# Patient Record
Sex: Female | Born: 1980
Health system: Southern US, Community
[De-identification: ages and names within clinical notes are randomized; demographics above are authoritative.]

## PROBLEM LIST (undated history)

## (undated) DIAGNOSIS — M419 Scoliosis, unspecified: Secondary | ICD-10-CM

## (undated) DIAGNOSIS — K429 Umbilical hernia without obstruction or gangrene: Secondary | ICD-10-CM

## (undated) DIAGNOSIS — F329 Major depressive disorder, single episode, unspecified: Secondary | ICD-10-CM

## (undated) DIAGNOSIS — D573 Sickle-cell trait: Secondary | ICD-10-CM

## (undated) DIAGNOSIS — F429 Obsessive-compulsive disorder, unspecified: Secondary | ICD-10-CM

## (undated) DIAGNOSIS — M797 Fibromyalgia: Secondary | ICD-10-CM

## (undated) DIAGNOSIS — R51 Headache: Secondary | ICD-10-CM

## (undated) DIAGNOSIS — K219 Gastro-esophageal reflux disease without esophagitis: Secondary | ICD-10-CM

## (undated) DIAGNOSIS — M1990 Unspecified osteoarthritis, unspecified site: Secondary | ICD-10-CM

## (undated) DIAGNOSIS — D649 Anemia, unspecified: Secondary | ICD-10-CM

## (undated) DIAGNOSIS — G5603 Carpal tunnel syndrome, bilateral upper limbs: Secondary | ICD-10-CM

## (undated) DIAGNOSIS — F410 Panic disorder [episodic paroxysmal anxiety] without agoraphobia: Secondary | ICD-10-CM

## (undated) DIAGNOSIS — F419 Anxiety disorder, unspecified: Secondary | ICD-10-CM

## (undated) DIAGNOSIS — F32A Depression, unspecified: Secondary | ICD-10-CM

## (undated) DIAGNOSIS — Z8709 Personal history of other diseases of the respiratory system: Secondary | ICD-10-CM

## (undated) DIAGNOSIS — D259 Leiomyoma of uterus, unspecified: Secondary | ICD-10-CM

## (undated) DIAGNOSIS — F319 Bipolar disorder, unspecified: Secondary | ICD-10-CM

## (undated) DIAGNOSIS — R7303 Prediabetes: Secondary | ICD-10-CM

## (undated) HISTORY — PX: WISDOM TOOTH EXTRACTION: SHX21

## (undated) HISTORY — PX: BACK SURGERY: SHX140

---
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
## 2000-04-07 ENCOUNTER — Encounter: Payer: Self-pay | Admitting: Emergency Medicine

## 2000-04-07 ENCOUNTER — Emergency Department (HOSPITAL_COMMUNITY): Admission: EM | Admit: 2000-04-07 | Discharge: 2000-04-07 | Payer: Self-pay | Admitting: Emergency Medicine

## 2001-04-28 ENCOUNTER — Emergency Department (HOSPITAL_COMMUNITY): Admission: EM | Admit: 2001-04-28 | Discharge: 2001-04-28 | Payer: Self-pay

## 2001-11-25 ENCOUNTER — Emergency Department (HOSPITAL_COMMUNITY): Admission: EM | Admit: 2001-11-25 | Discharge: 2001-11-25 | Payer: Self-pay | Admitting: Emergency Medicine

## 2002-04-09 ENCOUNTER — Emergency Department (HOSPITAL_COMMUNITY): Admission: EM | Admit: 2002-04-09 | Discharge: 2002-04-09 | Payer: Self-pay | Admitting: *Deleted

## 2002-06-11 ENCOUNTER — Emergency Department (HOSPITAL_COMMUNITY): Admission: EM | Admit: 2002-06-11 | Discharge: 2002-06-11 | Payer: Self-pay | Admitting: *Deleted

## 2003-03-09 ENCOUNTER — Emergency Department (HOSPITAL_COMMUNITY): Admission: EM | Admit: 2003-03-09 | Discharge: 2003-03-09 | Payer: Self-pay | Admitting: Emergency Medicine

## 2003-05-16 ENCOUNTER — Emergency Department (HOSPITAL_COMMUNITY): Admission: EM | Admit: 2003-05-16 | Discharge: 2003-05-16 | Payer: Self-pay | Admitting: Emergency Medicine

## 2003-09-10 ENCOUNTER — Emergency Department (HOSPITAL_COMMUNITY): Admission: AD | Admit: 2003-09-10 | Discharge: 2003-09-10 | Payer: Self-pay | Admitting: Family Medicine

## 2004-06-30 ENCOUNTER — Emergency Department (HOSPITAL_COMMUNITY): Admission: EM | Admit: 2004-06-30 | Discharge: 2004-06-30 | Payer: Self-pay | Admitting: Emergency Medicine

## 2004-08-04 ENCOUNTER — Emergency Department (HOSPITAL_COMMUNITY): Admission: EM | Admit: 2004-08-04 | Discharge: 2004-08-04 | Payer: Self-pay | Admitting: Emergency Medicine

## 2004-08-24 ENCOUNTER — Emergency Department (HOSPITAL_COMMUNITY): Admission: EM | Admit: 2004-08-24 | Discharge: 2004-08-24 | Payer: Self-pay | Admitting: Family Medicine

## 2004-12-14 ENCOUNTER — Emergency Department (HOSPITAL_COMMUNITY): Admission: EM | Admit: 2004-12-14 | Discharge: 2004-12-14 | Payer: Self-pay | Admitting: Emergency Medicine

## 2005-01-19 ENCOUNTER — Ambulatory Visit: Payer: Self-pay | Admitting: Family Medicine

## 2005-01-24 ENCOUNTER — Ambulatory Visit: Payer: Self-pay | Admitting: *Deleted

## 2005-01-27 ENCOUNTER — Ambulatory Visit: Payer: Self-pay | Admitting: Internal Medicine

## 2005-02-03 ENCOUNTER — Ambulatory Visit: Payer: Self-pay | Admitting: Internal Medicine

## 2005-03-07 ENCOUNTER — Ambulatory Visit: Payer: Self-pay | Admitting: Family Medicine

## 2005-03-14 ENCOUNTER — Ambulatory Visit: Payer: Self-pay | Admitting: Family Medicine

## 2005-03-30 ENCOUNTER — Ambulatory Visit: Payer: Self-pay | Admitting: Family Medicine

## 2005-08-17 ENCOUNTER — Emergency Department (HOSPITAL_COMMUNITY): Admission: EM | Admit: 2005-08-17 | Discharge: 2005-08-17 | Payer: Self-pay | Admitting: Family Medicine

## 2005-09-22 ENCOUNTER — Emergency Department (HOSPITAL_COMMUNITY): Admission: EM | Admit: 2005-09-22 | Discharge: 2005-09-22 | Payer: Self-pay | Admitting: Family Medicine

## 2005-10-28 ENCOUNTER — Emergency Department (HOSPITAL_COMMUNITY): Admission: EM | Admit: 2005-10-28 | Discharge: 2005-10-28 | Payer: Self-pay | Admitting: Family Medicine

## 2006-05-27 ENCOUNTER — Emergency Department (HOSPITAL_COMMUNITY): Admission: EM | Admit: 2006-05-27 | Discharge: 2006-05-27 | Payer: Self-pay | Admitting: Emergency Medicine

## 2006-05-31 ENCOUNTER — Emergency Department (HOSPITAL_COMMUNITY): Admission: EM | Admit: 2006-05-31 | Discharge: 2006-05-31 | Payer: Self-pay | Admitting: Family Medicine

## 2006-07-30 ENCOUNTER — Ambulatory Visit: Payer: Self-pay | Admitting: Internal Medicine

## 2006-08-02 ENCOUNTER — Ambulatory Visit (HOSPITAL_COMMUNITY): Admission: RE | Admit: 2006-08-02 | Discharge: 2006-08-02 | Payer: Self-pay | Admitting: Internal Medicine

## 2006-08-03 ENCOUNTER — Ambulatory Visit: Payer: Self-pay | Admitting: Internal Medicine

## 2006-09-19 ENCOUNTER — Ambulatory Visit: Payer: Self-pay | Admitting: Obstetrics & Gynecology

## 2006-09-19 ENCOUNTER — Encounter (INDEPENDENT_AMBULATORY_CARE_PROVIDER_SITE_OTHER): Payer: Self-pay | Admitting: *Deleted

## 2006-10-10 ENCOUNTER — Ambulatory Visit (HOSPITAL_COMMUNITY): Admission: RE | Admit: 2006-10-10 | Discharge: 2006-10-10 | Payer: Self-pay | Admitting: Gynecology

## 2006-12-11 ENCOUNTER — Ambulatory Visit: Payer: Self-pay | Admitting: Internal Medicine

## 2006-12-13 ENCOUNTER — Ambulatory Visit (HOSPITAL_COMMUNITY): Admission: RE | Admit: 2006-12-13 | Discharge: 2006-12-13 | Payer: Self-pay | Admitting: Internal Medicine

## 2007-03-15 ENCOUNTER — Telehealth (INDEPENDENT_AMBULATORY_CARE_PROVIDER_SITE_OTHER): Payer: Self-pay | Admitting: Internal Medicine

## 2007-03-15 ENCOUNTER — Emergency Department (HOSPITAL_COMMUNITY): Admission: EM | Admit: 2007-03-15 | Discharge: 2007-03-15 | Payer: Self-pay | Admitting: Family Medicine

## 2007-06-24 ENCOUNTER — Telehealth (INDEPENDENT_AMBULATORY_CARE_PROVIDER_SITE_OTHER): Payer: Self-pay | Admitting: Internal Medicine

## 2007-10-26 ENCOUNTER — Emergency Department (HOSPITAL_COMMUNITY): Admission: EM | Admit: 2007-10-26 | Discharge: 2007-10-26 | Payer: Self-pay | Admitting: Emergency Medicine

## 2007-12-28 ENCOUNTER — Emergency Department (HOSPITAL_COMMUNITY): Admission: EM | Admit: 2007-12-28 | Discharge: 2007-12-28 | Payer: Self-pay | Admitting: Family Medicine

## 2008-09-02 ENCOUNTER — Emergency Department (HOSPITAL_COMMUNITY): Admission: EM | Admit: 2008-09-02 | Discharge: 2008-09-02 | Payer: Self-pay | Admitting: Emergency Medicine

## 2008-09-08 ENCOUNTER — Encounter (INDEPENDENT_AMBULATORY_CARE_PROVIDER_SITE_OTHER): Payer: Self-pay | Admitting: Internal Medicine

## 2008-09-08 ENCOUNTER — Emergency Department (HOSPITAL_COMMUNITY): Admission: EM | Admit: 2008-09-08 | Discharge: 2008-09-08 | Payer: Self-pay | Admitting: Emergency Medicine

## 2008-10-30 ENCOUNTER — Encounter (INDEPENDENT_AMBULATORY_CARE_PROVIDER_SITE_OTHER): Payer: Self-pay | Admitting: Nurse Practitioner

## 2008-10-30 ENCOUNTER — Ambulatory Visit: Payer: Self-pay | Admitting: Internal Medicine

## 2008-10-30 DIAGNOSIS — M2550 Pain in unspecified joint: Secondary | ICD-10-CM | POA: Insufficient documentation

## 2008-10-30 LAB — CONVERTED CEMR LAB
ALT: 12 units/L (ref 0–35)
Albumin: 4.4 g/dL (ref 3.5–5.2)
Anti Nuclear Antibody(ANA): NEGATIVE
BUN: 14 mg/dL (ref 6–23)
Calcium: 9.6 mg/dL (ref 8.4–10.5)
Creatinine, Ser: 0.86 mg/dL (ref 0.40–1.20)
Sed Rate: 10 mm/hr (ref 0–22)
Sodium: 138 meq/L (ref 135–145)
Total Protein: 7.6 g/dL (ref 6.0–8.3)

## 2008-11-09 ENCOUNTER — Encounter (INDEPENDENT_AMBULATORY_CARE_PROVIDER_SITE_OTHER): Payer: Self-pay | Admitting: Internal Medicine

## 2008-11-09 DIAGNOSIS — G4700 Insomnia, unspecified: Secondary | ICD-10-CM | POA: Insufficient documentation

## 2008-11-09 DIAGNOSIS — G894 Chronic pain syndrome: Secondary | ICD-10-CM

## 2008-11-24 ENCOUNTER — Encounter (INDEPENDENT_AMBULATORY_CARE_PROVIDER_SITE_OTHER): Payer: Self-pay | Admitting: Internal Medicine

## 2008-11-24 DIAGNOSIS — M4120 Other idiopathic scoliosis, site unspecified: Secondary | ICD-10-CM | POA: Insufficient documentation

## 2008-11-25 ENCOUNTER — Telehealth (INDEPENDENT_AMBULATORY_CARE_PROVIDER_SITE_OTHER): Payer: Self-pay | Admitting: Nurse Practitioner

## 2008-12-03 ENCOUNTER — Telehealth (INDEPENDENT_AMBULATORY_CARE_PROVIDER_SITE_OTHER): Payer: Self-pay | Admitting: Internal Medicine

## 2008-12-11 ENCOUNTER — Ambulatory Visit: Payer: Self-pay | Admitting: Internal Medicine

## 2008-12-11 DIAGNOSIS — R7309 Other abnormal glucose: Secondary | ICD-10-CM

## 2008-12-11 LAB — CONVERTED CEMR LAB
Blood Glucose, Fingerstick: 127
Blood in Urine, dipstick: NEGATIVE
KOH Prep: NEGATIVE
Urobilinogen, UA: 0.2
Whiff Test: POSITIVE

## 2008-12-24 ENCOUNTER — Encounter (INDEPENDENT_AMBULATORY_CARE_PROVIDER_SITE_OTHER): Payer: Self-pay | Admitting: Internal Medicine

## 2008-12-25 ENCOUNTER — Encounter (INDEPENDENT_AMBULATORY_CARE_PROVIDER_SITE_OTHER): Payer: Self-pay | Admitting: Internal Medicine

## 2009-01-05 ENCOUNTER — Ambulatory Visit: Payer: Self-pay | Admitting: Internal Medicine

## 2009-01-27 ENCOUNTER — Encounter (INDEPENDENT_AMBULATORY_CARE_PROVIDER_SITE_OTHER): Payer: Self-pay | Admitting: Internal Medicine

## 2009-02-08 ENCOUNTER — Telehealth (INDEPENDENT_AMBULATORY_CARE_PROVIDER_SITE_OTHER): Payer: Self-pay | Admitting: Internal Medicine

## 2009-03-04 ENCOUNTER — Ambulatory Visit: Payer: Self-pay | Admitting: Internal Medicine

## 2009-03-04 DIAGNOSIS — M542 Cervicalgia: Secondary | ICD-10-CM

## 2009-03-04 DIAGNOSIS — R1084 Generalized abdominal pain: Secondary | ICD-10-CM

## 2009-03-04 DIAGNOSIS — D582 Other hemoglobinopathies: Secondary | ICD-10-CM

## 2009-03-04 LAB — CONVERTED CEMR LAB
Eosinophils Absolute: 0.2 10*3/uL (ref 0.0–0.7)
Eosinophils Relative: 2 % (ref 0–5)
Hemoglobin: 12.8 g/dL (ref 12.0–15.0)
Lymphocytes Relative: 31 % (ref 12–46)
Lymphs Abs: 2.7 10*3/uL (ref 0.7–4.0)
Monocytes Relative: 10 % (ref 3–12)
Neutro Abs: 4.9 10*3/uL (ref 1.7–7.7)
Platelets: 288 10*3/uL (ref 150–400)
RBC: 4.17 M/uL (ref 3.87–5.11)

## 2009-03-05 ENCOUNTER — Encounter (INDEPENDENT_AMBULATORY_CARE_PROVIDER_SITE_OTHER): Payer: Self-pay | Admitting: Nurse Practitioner

## 2009-03-08 DIAGNOSIS — N3000 Acute cystitis without hematuria: Secondary | ICD-10-CM

## 2009-04-23 ENCOUNTER — Emergency Department (HOSPITAL_COMMUNITY): Admission: EM | Admit: 2009-04-23 | Discharge: 2009-04-23 | Payer: Self-pay | Admitting: Family Medicine

## 2009-05-10 ENCOUNTER — Telehealth (INDEPENDENT_AMBULATORY_CARE_PROVIDER_SITE_OTHER): Payer: Self-pay | Admitting: Internal Medicine

## 2009-05-18 ENCOUNTER — Encounter (INDEPENDENT_AMBULATORY_CARE_PROVIDER_SITE_OTHER): Payer: Self-pay | Admitting: Internal Medicine

## 2009-05-27 ENCOUNTER — Telehealth (INDEPENDENT_AMBULATORY_CARE_PROVIDER_SITE_OTHER): Payer: Self-pay | Admitting: Internal Medicine

## 2009-06-17 ENCOUNTER — Ambulatory Visit: Payer: Self-pay | Admitting: Internal Medicine

## 2009-06-17 DIAGNOSIS — K439 Ventral hernia without obstruction or gangrene: Secondary | ICD-10-CM | POA: Insufficient documentation

## 2009-06-17 DIAGNOSIS — R259 Unspecified abnormal involuntary movements: Secondary | ICD-10-CM | POA: Insufficient documentation

## 2009-06-17 LAB — CONVERTED CEMR LAB
ALT: 15 units/L (ref 0–35)
Alkaline Phosphatase: 44 units/L (ref 39–117)
BUN: 10 mg/dL (ref 6–23)
Barbiturate Quant, Ur: NEGATIVE
Basophils Absolute: 0 10*3/uL (ref 0.0–0.1)
Benzodiazepines.: NEGATIVE
Creatinine, Ser: 0.67 mg/dL (ref 0.40–1.20)
Glucose, Bld: 106 mg/dL — ABNORMAL HIGH (ref 70–99)
Glucose, Urine, Semiquant: NEGATIVE
Hemoglobin: 13.4 g/dL (ref 12.0–15.0)
Lymphocytes Relative: 28 % (ref 12–46)
Monocytes Relative: 10 % (ref 3–12)
Neutro Abs: 5.4 10*3/uL (ref 1.7–7.7)
Nitrite: NEGATIVE
Opiate Screen, Urine: NEGATIVE
Phencyclidine (PCP): NEGATIVE
Platelets: 267 10*3/uL (ref 150–400)
Potassium: 4 meq/L (ref 3.5–5.3)
Protein, U semiquant: NEGATIVE
Total Bilirubin: 0.7 mg/dL (ref 0.3–1.2)
Total Protein: 7.7 g/dL (ref 6.0–8.3)

## 2009-06-21 ENCOUNTER — Encounter (INDEPENDENT_AMBULATORY_CARE_PROVIDER_SITE_OTHER): Payer: Self-pay | Admitting: Internal Medicine

## 2009-06-25 ENCOUNTER — Telehealth (INDEPENDENT_AMBULATORY_CARE_PROVIDER_SITE_OTHER): Payer: Self-pay | Admitting: Internal Medicine

## 2009-07-09 ENCOUNTER — Encounter: Admission: RE | Admit: 2009-07-09 | Discharge: 2009-07-09 | Payer: Self-pay | Admitting: General Surgery

## 2009-07-13 ENCOUNTER — Ambulatory Visit: Payer: Self-pay | Admitting: Internal Medicine

## 2009-07-13 DIAGNOSIS — F319 Bipolar disorder, unspecified: Secondary | ICD-10-CM | POA: Insufficient documentation

## 2009-07-13 DIAGNOSIS — D259 Leiomyoma of uterus, unspecified: Secondary | ICD-10-CM

## 2009-07-23 ENCOUNTER — Ambulatory Visit: Payer: Self-pay | Admitting: Internal Medicine

## 2009-07-29 ENCOUNTER — Encounter (INDEPENDENT_AMBULATORY_CARE_PROVIDER_SITE_OTHER): Payer: Self-pay | Admitting: Internal Medicine

## 2009-09-21 ENCOUNTER — Encounter (INDEPENDENT_AMBULATORY_CARE_PROVIDER_SITE_OTHER): Payer: Self-pay | Admitting: Internal Medicine

## 2009-09-25 ENCOUNTER — Emergency Department (HOSPITAL_COMMUNITY): Admission: EM | Admit: 2009-09-25 | Discharge: 2009-09-25 | Payer: Self-pay | Admitting: Family Medicine

## 2009-09-29 ENCOUNTER — Ambulatory Visit: Payer: Self-pay | Admitting: Nurse Practitioner

## 2009-10-15 ENCOUNTER — Ambulatory Visit: Payer: Self-pay | Admitting: Internal Medicine

## 2009-10-19 ENCOUNTER — Ambulatory Visit: Payer: Self-pay | Admitting: Nurse Practitioner

## 2009-10-22 ENCOUNTER — Ambulatory Visit: Payer: Self-pay | Admitting: Internal Medicine

## 2009-10-29 ENCOUNTER — Ambulatory Visit: Payer: Self-pay | Admitting: Internal Medicine

## 2009-11-02 ENCOUNTER — Ambulatory Visit: Payer: Self-pay | Admitting: Internal Medicine

## 2009-11-16 ENCOUNTER — Encounter: Admission: RE | Admit: 2009-11-16 | Discharge: 2009-11-29 | Payer: Self-pay | Admitting: Internal Medicine

## 2009-11-17 ENCOUNTER — Encounter (INDEPENDENT_AMBULATORY_CARE_PROVIDER_SITE_OTHER): Payer: Self-pay | Admitting: Internal Medicine

## 2009-11-18 ENCOUNTER — Ambulatory Visit: Payer: Self-pay | Admitting: Internal Medicine

## 2009-11-19 ENCOUNTER — Ambulatory Visit: Payer: Self-pay | Admitting: Internal Medicine

## 2009-11-25 ENCOUNTER — Ambulatory Visit: Payer: Self-pay | Admitting: Internal Medicine

## 2009-11-29 ENCOUNTER — Encounter (INDEPENDENT_AMBULATORY_CARE_PROVIDER_SITE_OTHER): Payer: Self-pay | Admitting: Internal Medicine

## 2009-12-02 ENCOUNTER — Encounter (INDEPENDENT_AMBULATORY_CARE_PROVIDER_SITE_OTHER): Payer: Self-pay | Admitting: Internal Medicine

## 2009-12-16 ENCOUNTER — Ambulatory Visit: Payer: Self-pay | Admitting: Internal Medicine

## 2010-01-03 ENCOUNTER — Encounter (INDEPENDENT_AMBULATORY_CARE_PROVIDER_SITE_OTHER): Payer: Self-pay | Admitting: Internal Medicine

## 2010-01-20 ENCOUNTER — Ambulatory Visit: Payer: Self-pay | Admitting: Internal Medicine

## 2010-01-26 ENCOUNTER — Emergency Department (HOSPITAL_COMMUNITY): Admission: EM | Admit: 2010-01-26 | Discharge: 2010-01-27 | Payer: Self-pay | Admitting: Emergency Medicine

## 2010-02-01 ENCOUNTER — Ambulatory Visit: Payer: Self-pay | Admitting: Internal Medicine

## 2010-02-10 ENCOUNTER — Ambulatory Visit: Payer: Self-pay | Admitting: Internal Medicine

## 2010-03-11 ENCOUNTER — Encounter (INDEPENDENT_AMBULATORY_CARE_PROVIDER_SITE_OTHER): Payer: Self-pay | Admitting: Internal Medicine

## 2010-05-04 ENCOUNTER — Telehealth (INDEPENDENT_AMBULATORY_CARE_PROVIDER_SITE_OTHER): Payer: Self-pay | Admitting: Internal Medicine

## 2010-06-15 ENCOUNTER — Telehealth (INDEPENDENT_AMBULATORY_CARE_PROVIDER_SITE_OTHER): Payer: Self-pay | Admitting: Internal Medicine

## 2010-07-12 ENCOUNTER — Ambulatory Visit
Admission: RE | Admit: 2010-07-12 | Discharge: 2010-07-12 | Payer: Self-pay | Source: Home / Self Care | Attending: Internal Medicine | Admitting: Internal Medicine

## 2010-07-19 ENCOUNTER — Encounter (INDEPENDENT_AMBULATORY_CARE_PROVIDER_SITE_OTHER): Payer: Self-pay | Admitting: Internal Medicine

## 2010-07-21 NOTE — Letter (Signed)
Summary: REQUESTING RECORDS FROM [facility]  REQUESTING RECORDS FROM [facility]   Imported By: Arta Bruce 07/09/2009 10:20:12  _____________________________________________________________________  External Attachment:    Type:   Image     Comment:   External Document

## 2010-07-21 NOTE — Miscellaneous (Signed)
Summary: Rehab Report//INITIAL SUMMARY  Rehab Report//INITIAL SUMMARY   Imported By: Arta Bruce 11/19/2009 09:42:25  _____________________________________________________________________  External Attachment:    Type:   Image     Comment:   External Document

## 2010-07-21 NOTE — Letter (Signed)
Summary: Discharge Summary  Discharge Summary   Imported By: Arta Bruce 02/04/2010 15:39:17  _____________________________________________________________________  External Attachment:    Type:   Image     Comment:   External Document
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
## 2010-07-21 NOTE — Letter (Signed)
Summary: clinical psychological eval  clinical psychological eval   Imported By: Arta Bruce 12/21/2009 15:52:33  _____________________________________________________________________  External Attachment:    Type:   Image     Comment:   External Document

## 2010-07-21 NOTE — Letter (Signed)
Summary: NO-SUICIDE CONTRACT  NO-SUICIDE CONTRACT   Imported By: Arta Bruce 10/18/2009 12:40:18  _____________________________________________________________________  External Attachment:    Type:   Image     Comment:   External Document

## 2010-07-21 NOTE — Letter (Signed)
Summary: [facility]//MISSED APPT  [facility]//MISSED APPT   Imported By: Arta Bruce 08/06/2009 12:16:44  _____________________________________________________________________  External Attachment:    Type:   Image     Comment:   External Document

## 2010-07-21 NOTE — Letter (Signed)
Summary: MAILED REQUESTED RECORDS TO HEARING & APPEALS  MAILED REQUESTED RECORDS TO HEARING & APPEALS   Imported By: Arta Bruce 03/11/2010 15:37:23  _____________________________________________________________________  External Attachment:    Type:   Image     Comment:   External Document

## 2010-07-21 NOTE — Assessment & Plan Note (Signed)
Summary: Ear irrigation /tmm  Nurse Visit   Allergies: 1)  ! Tramadol Hcl  Orders Added: 1)  Est. Patient Level I [16109]

## 2010-07-21 NOTE — Assessment & Plan Note (Signed)
Summary: Chronic pain   Vital Signs:  Patient profile:   30 year old female Height:      67 inches Weight:      155 pounds BMI:     24.36 Temp:     97.8 degrees F oral Pulse rate:   88 / minute Pulse rhythm:   regular Resp:     18 per minute BP sitting:   100 / 70  (left arm) Cuff size:   regular  Vitals Entered By: [place] Shannon (September 29, 2009 11:39 AM) CC: pt is here for pain in her toes and fingers... pt says she is unable to write because her fingers are so sore... pt says she is unable to wiggle her toes and she is unable to walk on right foot.... pt says she has been walking on her right toe and now her foot is swollen.. pt says it feels like pin needles poking her... Is Patient Diabetic? No Pain Assessment Patient in pain? no       Does patient need assistance? Functional Status Self care Ambulation Normal Comments pt wants to be referred to a doctor who can tell her about arthriitis...   CC:  pt is here for pain in her toes and fingers... pt says she is unable to write because her fingers are so sore... pt says she is unable to wiggle her toes and she is unable to walk on right foot.... pt says she has been walking on her right toe and now her foot is swollen.. pt says it feels like pin needles poking her....  History of Present Illness:  Pt into the office with complaints of pain Describes multiple areas of pain throughout her body She has been to the Urgent care several times for evaluation. No change in therapy. No chronic pain medications  4 days ago she started with pain in her right foot Pain started in the heel of the foot it has progressed over the past few days to toes. Ambulation has been limited due to the pain Pt was refered to the pain clinic but was not accepted because at the time she had family planning medicaid so was not accepted.  Medications - Admits that she is NOT taking zoloft and lyrica "I am scared to swallow pills" She is only able to  take the flexeril because it is a small tablet.   Pt later recants and says she is not taking zoloft because it makes her sick  Aquilla Solian - pt has not been to see Aquilla Solian due to some problems with her medicaid. She is willing to reschedule the appointment as she realizes the need for an appointment long history of both personal and mental health issues but no routine f/u   Habits & Providers  Alcohol-Tobacco-Diet     Alcohol drinks/day: <1     Alcohol Counseling: to STOP drinking     Alcohol type: beer     Tobacco Status: current     Tobacco Counseling: to quit use of tobacco products     Cigarette Packs/Day: <0.25  Exercise-Depression-Behavior     Have you felt down or hopeless? yes     Have you felt little pleasure in things? yes     Depression Counseling: further diagnostic testing and/or other treatment is indicated     Drug Use: marijuanna  Current Medications (verified): 1)  Zoloft 50 Mg Tabs (Sertraline Hcl) .... 2 Tabs By Mouth Daily 2)  Lyrica 75 Mg Caps (Pregabalin) .Marland KitchenMarland KitchenMarland Kitchen 1  Cap By Mouth Two Times A Day 3)  Flexeril 10 Mg Tabs (Cyclobenzaprine Hcl) .Marland Kitchen.. 1 Tab By Mouth Every 8 Hours As Needed For Neck Pain 4)  Ciprofloxacin Hcl 500 Mg Tabs (Ciprofloxacin Hcl) .... One Tablet By Mouth Two Times A Day  Allergies (verified): No Known Drug Allergies  Social History: Smoking Status:  current Packs/Day:  <0.25 Drug Use:  marijuanna  Review of Systems CV:  Denies chest pain or discomfort. Resp:  Denies shortness of breath. GI:  Complains of nausea and vomiting. MS:  Complains of joint pain. Psych:  Complains of anxiety and depression.  Physical Exam  General:  alert.   Head:  normocephalic.   Lungs:  unable to listen due to constant movement during the exam Heart:  unable to listen due to constant movement during the exam Neurologic:  alert Skin:  exaggerated tenderness with palpation of trigger points Psych:  severely anxious, easily distracted, and  poor concentration.     Impression & Recommendations:  Problem # 1:  CHRONIC PAIN SYNDROME (ICD-338.4) pt was denied access to the pain clinic several months ago due to only having family planning medicaid will start pt on savella since she is not consistent with taking her lyrica  samples given will only offer pt ibuprofen - liquid as per pt's request will not given any narcotic medications  Problem # 2:  BIPOLAR DISORDER UNSPECIFIED (ICD-296.80) pt is NOT taking her medications as ordered she reports a fear of taking pills Pt did not take zoloft as ordered so will start savella (samples given)  Complete Medication List: 1)  Flexeril 10 Mg Tabs (Cyclobenzaprine hcl) .Marland Kitchen.. 1 tab by mouth every 8 hours as needed for neck pain 2)  Savella Titration Pack 12.5 & 25 & 50 Mg Misc (Milnacipran hcl) .... Take as directed 3)  Ibuprofen 100 Mg/14ml Susp (Ibuprofen) .... 30ml by mouth daily as needed for pain  Patient Instructions: 1)  Schedule an appointment with Aquilla Solian at next available 2)  Take the new medication as indicated on sample box.  it is very important that you take this medication daily 3)  Schedule an appointment with Dr. Delrae Alfred in 1- 2  week (30 minute slot) 4)  Assess medications - savella Prescriptions: IBUPROFEN 100 MG/5ML SUSP (IBUPROFEN) 30ml by mouth daily as needed for pain  #361ml x 0   Entered and Authorized by:   Lehman Prom FNP   Signed by:   Lehman Prom FNP on 09/29/2009   Method used:   Print then Give to Patient   RxID:   [id number] SAVELLA TITRATION PACK 12.5 & 25 & 50 MG MISC (MILNACIPRAN HCL) take as directed  #1 x 0   Entered and Authorized by:   Lehman Prom FNP   Signed by:   Lehman Prom FNP on 09/29/2009   Method used:   Samples Given   RxID:   [id number]

## 2010-07-21 NOTE — Letter (Signed)
Summary: *HSN Results Follow up  [facility]  [address] [place], [place] [address]   Phone: [phone]  Fax: [phone]      06/21/2009   [facility] [address] [place], [place]  [address]   Dear  Ms. Nala Voorheis,                            ____S.Drinkard,FNP   ____D. Gore,FNP       ____B. McPherson,MD   ____V. Rankins,MD    __X__E. Britney Newstrom,MD    ____N. Daphine Deutscher, FNP  ____D. Reche Dixon, MD    ____K. Philipp Deputy, MD    ____Other     This letter is to inform you that your recent test(s):  _______Pap Smear    __X_____Lab Test     _______X-ray    ___X____ is within acceptable limits  _______ requires a medication change  _______ requires a follow-up lab visit  _______ requires a follow-up visit with your provider   Comments:       _________________________________________________________ If you have any questions, please contact our office                     Sincerely,  Julieanne Manson MD [facility]

## 2010-07-21 NOTE — Assessment & Plan Note (Signed)
Summary: 2 week f/u /tmm   Vital Signs:  Patient profile:   30 year old female Weight:      152.9 pounds BMI:     24.03 BSA:     1.81 Temp:     98.3 degrees F oral Pulse rate:   88 / minute Pulse rhythm:   regular Resp:     20 per minute BP sitting:   115 / 70  (left arm) Cuff size:   regular  Vitals Entered ByLevon Hedger (Oct 29, 2009 2:02 PM) CC: follow-up visit.Marland Kitchenpt  is still having shocking pain on the right side of her head...feeling very fatigued Is Patient Diabetic? No Pain Assessment Patient in pain? yes     Location: neck,hands, legs, back,eyes Intensity: 9-10 Onset of pain  Intermittent  Does patient need assistance? Functional Status Self care Ambulation Normal   CC:  follow-up visit.Marland Kitchenpt  is still having shocking pain on the right side of her head...feeling very fatigued.  History of Present Illness: 1.  Depression/possibly Bipolar Disorder:  Pt. did see Aquilla Solian last week.  To see her again beginning of next week.  Pt. was previously counseled by Ms. Vaughn years ago at [facility] for file.  They did not discuss medication at that visit.  They did discuss getting her set up with psychiatry.  Pt. states she did finish the Savella starter pack--is up to 50 mg two times a day at this point.  Does not have the starter pack with her.  No definite plans for suicide.  Pt. now states she has started back on the Zoloft--is not taking daily, however.  Had told our NP as well as others that she was not taking the Zoloft.  Jumps from one topic to another.  2.  Multple areas of pain:  Right ear pain--did use the drops to soften cerumen and had ear irrigated on 10/22/09--ear better now.  Then begins discussing all areas of chronic pain--would be willing to go back to PT to work on headache and neck.  Not taking the Lyrica.  Has 1 1/2 bottles at home.  Would be willing to try again.  Medications Prior to Update: 1)  Flexeril 10 Mg Tabs (Cyclobenzaprine Hcl) .Marland Kitchen..  1 Tab By Mouth Every 8 Hours As Needed For Neck Pain 2)  Savella Titration Pack 12.5 & 25 & 50 Mg Misc (Milnacipran Hcl) .... Take As Directed 3)  Ibuprofen 100 Mg/52ml Susp (Ibuprofen) .... 30ml By Mouth Daily As Needed For Pain 4)  Zoloft 50 Mg Tabs (Sertraline Hcl) .... Take 2 Tablets Daily Po 5)  Lyrica 75 Mg Caps (Pregabalin) .... Take 1 Capsules By Mouth Bid 6)  Lamisil 250 Mg Tabs (Terbinafine Hcl) .... Take 1 Tablet Once Daily Po 7)  Cortisporin-Tc 3.08-19-08-0.5 Mg/ml Susp (Neomycin-Colist-Hc-Thonzonium) .... 4 Ggt Right Ear 4 Times Daily For 5 Days.  Allergies (verified): 1)  ! Tramadol Hcl  Physical Exam  General:  NAD initially--jumps from one area of concern to another subsequently Ears:  Some hard wax in left canal.  Right canal appears normal Lungs:  Normal respiratory effort, chest expands symmetrically. Lungs are clear to auscultation, no crackles or wheezes. Heart:  Normal rate and regular rhythm. S1 and S2 normal without gallop, murmur, click, rub or other extra sounds. Abdomen:  soft, normal bowel sounds, no masses, no guarding, no hepatomegaly, and no splenomegaly.  Scattered areas of tenderness that are not consistent during exam.    Impression & Recommendations:  Problem #  1:  CHRONIC PAIN SYNDROME (ICD-338.4) Continue Savella and restart Lyrica  To stop Zoloft--is only using intermittently and has stated mutiple times previously that she has not been taking. Cyclobenzaprine  Problem # 2:  BIPOLAR DISORDER UNSPECIFIED (ICD-296.80) Savella for now--hopefully will soon get set up with psychiatry and continue counseling with Aquilla Solian  Problem # 3:  CERUMEN IMPACTION, RIGHT (ICD-380.4) Resolved  Complete Medication List: 1)  Flexeril 10 Mg Tabs (Cyclobenzaprine hcl) .Marland Kitchen.. 1 tab by mouth every 8 hours as needed for neck pain 2)  Savella Titration Pack 12.5 & 25 & 50 Mg Misc (Milnacipran hcl) .... Take as directed 3)  Ibuprofen 100 Mg/41ml Susp (Ibuprofen) ....  30ml by mouth daily as needed for pain 4)  Lyrica 75 Mg Caps (Pregabalin) .... Take 1 capsules by mouth bid 5)  Savella 50 Mg Tabs (Milnacipran hcl) .Marland Kitchen.. 1 tab by mouth two times a day  Patient Instructions: 1)  Follow up with Dr. Delrae Alfred in 2 weeks--depression Prescriptions: FLEXERIL 10 MG TABS (CYCLOBENZAPRINE HCL) 1 tab by mouth every 8 hours as needed for neck pain  #30 x 0   Entered and Authorized by:   Julieanne Manson MD   Signed by:   Julieanne Manson MD on 10/29/2009   Method used:   Print then Give to Patient   RxID:   [id number]    Appended Document: 2 week f/u /tmm    Clinical Lists Changes  Medications: Rx of SAVELLA TITRATION PACK 12.5 & 25 & 50 MG MISC (MILNACIPRAN HCL) take as directed;  #1 x 0;  Signed;  Entered by: Julieanne Manson MD;  Authorized by: Julieanne Manson MD;  Method used: Print then Give to Patient Rx of FLEXERIL 10 MG TABS (CYCLOBENZAPRINE HCL) 1 tab by mouth every 8 hours as needed for neck pain;  #30 x 0;  Signed;  Entered by: Julieanne Manson MD;  Authorized by: Julieanne Manson MD;  Method used: Print then Give to Patient Orders: Added new Referral order of Physical Therapy Referral (PT) - Signed    Prescriptions: FLEXERIL 10 MG TABS (CYCLOBENZAPRINE HCL) 1 tab by mouth every 8 hours as needed for neck pain  #30 x 0   Entered and Authorized by:   Julieanne Manson MD   Signed by:   Julieanne Manson MD on 10/29/2009   Method used:   Print then Give to Patient   RxID:   [id number] SAVELLA TITRATION PACK 12.5 & 25 & 50 MG MISC (MILNACIPRAN HCL) take as directed  #1 x 0   Entered and Authorized by:   Julieanne Manson MD   Signed by:   Julieanne Manson MD on 10/29/2009   Method used:   Print then Give to Patient   RxID:   [id number]  printed Flexeril to wrong paper and when tried to reprint--Savella apparantly hit instead--reprinting Flexeril  Appended Document: 2 week f/u /tmm    Clinical Lists  Changes  Orders: Added new Referral order of Physical Therapy Referral (PT) - Signed

## 2010-07-21 NOTE — Letter (Signed)
Summary: SCRIPT FROM DR.LAURA DUSKIN  SCRIPT FROM DR.LAURA DUSKIN   Imported By: Arta Bruce 02/09/2010 14:46:08  _____________________________________________________________________  External Attachment:    Type:   Image     Comment:   External Document

## 2010-07-21 NOTE — Letter (Signed)
Summary: CLINICAL PSYCHOLOGICAL EVAL  CLINICAL PSYCHOLOGICAL EVAL   Imported By: Arta Bruce 12/21/2009 15:47:53  _____________________________________________________________________  External Attachment:    Type:   Image     Comment:   External Document

## 2010-07-21 NOTE — Assessment & Plan Note (Signed)
Summary: DUP FOR DEPRESSION/JM   Vital Signs:  Patient profile:   30 year old female Height:      67 inches Weight:      148 pounds BMI:     23.26 Temp:     98.3 degrees F oral Pulse rate:   85 / minute Pulse rhythm:   regular Resp:     18 per minute BP sitting:   115 / 70  (left arm) Cuff size:   regular  Vitals Entered By: Arm[place] Shannon (November 19, 2009 2:25 PM) CC: f/u on depression.... pt says she has a headache ever since yesterday.. pt says the headache is messing with her vision, nausea and dizzy... Is Patient Diabetic? No Pain Assessment Patient in pain? no       Does patient need assistance? Functional Status Self care Ambulation Normal   CC:  f/u on depression.... pt says she has a headache ever since yesterday.. pt says the headache is messing with her vision and nausea and dizzy....  History of Present Illness: 1.  Psychiatric--?Personality disorder:  Pt. states saw [facility] yesterday and has not been set up yet with psychiatry.  Pt. states was told "a lot more going on then realized "  Not clear what delay is.  States taking Savella 2 tabs daily.  Did not take it today as too busy.    2.  Chronic pain syndrome:  Took Cyclobenzaprine when went to PT earlier in week--has only been once.  Looking into TENS unit.  Not taking Lyrica regularly.  Describes continued symptoms of headache, neck pain, etc.  Flexeril has been helpful.  Does not make her too sleepy, but then discusses how she almost fell asleep when driving home from PT and apparently took a Flexeril before the PT so it would not be too painful  Current Medications (verified): 1)  Flexeril 10 Mg Tabs (Cyclobenzaprine Hcl) .Marland Kitchen.. 1 Tab By Mouth Every 8 Hours As Needed For Neck Pain 2)  Savella Titration Pack 12.5 & 25 & 50 Mg Misc (Milnacipran Hcl) .... Take As Directed 3)  Ibuprofen 100 Mg/52ml Susp (Ibuprofen) .... 30ml By Mouth Daily As Needed For Pain 4)  Lyrica 75 Mg Caps (Pregabalin) .... Take 1 Capsules By  Mouth Bid 5)  Savella 50 Mg Tabs (Milnacipran Hcl) .Marland Kitchen.. 1 Tab By Mouth Two Times A Day  Allergies (verified): 1)  ! Tramadol Hcl  Physical Exam  General:  Patient jumps from one subject to another--difficult to get a coherent history Neck:  Very tender over tendons of posterior neck.   Impression & Recommendations:  Problem # 1:  NECK PAIN (ICD-723.1) Will allow for muscle relaxer for twice daily, but needs to take a bus or find someone else to drive her Her updated medication list for this problem includes:    Flexeril 10 Mg Tabs (Cyclobenzaprine hcl) .Marland Kitchen... 1 tab by mouth every 8 hours as needed for neck pain--generally use at bedtime    Ibuprofen 100 Mg/21ml Susp (Ibuprofen) .Marland KitchenMarland KitchenMarland KitchenMarland Kitchen 30ml by mouth daily as needed for pain  Problem # 2:  CHRONIC PAIN SYNDROME (ICD-338.4) As above--continue with PT as well.--  Planning TENS unit usage per PT notes  Problem # 3:  BIPOLAR DISORDER UNSPECIFIED (ICD-296.80) Vs other Need to check with Marchelle Folks to find out what plan is as I feel she will be able to tolerate pain better when this is addressed.  Complete Medication List: 1)  Flexeril 10 Mg Tabs (Cyclobenzaprine hcl) .Marland Kitchen.. 1 tab by  mouth every 8 hours as needed for neck pain--generally use at bedtime 2)  Ibuprofen 100 Mg/36ml Susp (Ibuprofen) .... 30ml by mouth daily as needed for pain 3)  Lyrica 75 Mg Caps (Pregabalin) .... Take 1 capsules by mouth bid 4)  Savella 50 Mg Tabs (Milnacipran hcl) .Marland Kitchen.. 1 tab by mouth two times a day  Patient Instructions: 1)  Follow up with Dr. Delrae Alfred in 4 months --pain and depression Prescriptions: SAVELLA 50 MG TABS (MILNACIPRAN HCL) 1 tab by mouth two times a day  #80 x 0   Entered and Authorized by:   Julieanne Manson MD   Signed by:   Julieanne Manson MD on 11/19/2009   Method used:   Samples Given   RxID:   [id number] SAVELLA TITRATION PACK 12.5 & 25 & 50 MG MISC (MILNACIPRAN HCL) take as directed  #80 x 0   Entered and Authorized by:    Julieanne Manson MD   Signed by:   Julieanne Manson MD on 11/19/2009   Method used:   Samples Given   RxID:   [id number] FLEXERIL 10 MG TABS (CYCLOBENZAPRINE HCL) 1 tab by mouth every 8 hours as needed for neck pain--generally use at bedtime  #60 x 0   Entered and Authorized by:   Julieanne Manson MD   Signed by:   Julieanne Manson MD on 11/19/2009   Method used:   Print then Give to Patient   RxID:   [id number]  Did not get 80 of titration pack--wrong Rx

## 2010-07-21 NOTE — Progress Notes (Signed)
Summary: FYI ABOUT VISITS  Phone Note Call from Patient Call back at [facility] Phone [phone]   Summary of Call: Kristen [name] ST. Kristen [name] WANTED TO LET YOU KNOW THE REASONE WHY SHE HASN'T COME IN FOR ANY APPOINTMENTS IS THAT EVERY APPT SHE HAS HAD, THE ELIGIBILITY LADIES KEEP RESCHEDULING HER AND THEY JUST RESCHED. ANOTHERONE TODAY. Kristen [name] SAYS SHE DOESN'T KNOW WHAT TO DO AND SHE IS IN A LOT OF PAIN. Initial call taken by: Leodis Rains,  May 04, 2010 11:28 AM  Follow-up for Phone Call        She was going to [facility], was she not? When she gets her eligibility, to call for appt. Follow-up by: Julieanne Manson MD,  May 04, 2010 3:13 PM  Additional Follow-up for Phone Call Additional follow up Details #1::        spoke with pt and she says she has not been to them because you are primary... pt is aware she needs an appt when she eligibilty.Marland KitchenMarland KitchenMarland KitchenArmenia [name]  May 05, 2010 11:51 AM

## 2010-07-21 NOTE — Letter (Signed)
Summary: OCCUMED WALK IN /RECORDS  OCCUMED WALK IN /RECORDS   Imported By: Arta Bruce 12/21/2009 15:43:53  _____________________________________________________________________  External Attachment:    Type:   Image     Comment:   External Document

## 2010-07-21 NOTE — Assessment & Plan Note (Signed)
Summary: fu per [place]//kt   Vital Signs:  Patient profile:   30 year old female Height:      67 inches Weight:      151.50 pounds Temp:     98.7 degrees F oral Pulse rate:   78 / minute Pulse rhythm:   regular Resp:     20 per minute BP sitting:   115 / 70  (left arm) Cuff size:   regular  Vitals Entered By: Geanie Cooley  (October 15, 2009 3:32 PM) CC: Pt here for followup on numbness and pain both her legs and feet. Pt states she has a earche in her right ear and it gives her headaches. Pain Assessment Patient in pain? yes     Location: legs and feet Intensity: 10 Type: heaviness,tingling and numbness  Does patient need assistance? Functional Status Self care Ambulation Normal   CC:  Pt here for followup on numbness and pain both her legs and feet. Pt states she has a earche in her right ear and it gives her headaches..  History of Present Illness: 1.  Psych issues:  Received pt.'s psychological disability evaluation from last spring--pt. was not taking her meds then (Zoloft) as she was afraid to take them.  She continues to feel this way and in fact, has not taken her Savella except for 3 non consecutive days in the past 2 weeks.    Discussed today that her pain is not going to improve if she does not get her psych issues under control--felt to have PTSD, Bipolar I with psychotic features based on paperwork from her disability evaluation last year.  Very emotional--much difficulty getting her to focus on any particular problem--jumps from one complaint of discomfort to another.    2.  Right ear pain:  Pt. brings up to me at end of visit.  Uses Q tips in ear canal.  No fever.  Just having pain.  Not necessarily with any manipulation of pinnae.  No sore throat.  Allergies (verified): 1)  ! Tramadol Hcl  Physical Exam  General:  Pt. up and down in room.  Sobbing at times--just feels like she cannot keep going with her current financial and pain situation.  Rubbing hands  over face, neck, back, legs Ears:  Hard cerumen packed up against TM on Right.  No tenderness with pressure over tragus and no discharge in ear other than cerumen noted above.  No definite inflammation of ear canal  Lungs:  Normal respiratory effort, chest expands symmetrically. Lungs are clear to auscultation, no crackles or wheezes. Heart:  Normal rate and regular rhythm. S1 and S2 normal without gallop, murmur, click, rub or other extra sounds.   Impression & Recommendations:  Problem # 1:  CHRONIC PAIN SYNDROME (ICD-338.4) See below  Problem # 2:  BIPOLAR DISORDER UNSPECIFIED (ICD-296.80) Unable initially, to get pt. to agree to any plan of action. Has a reason why she cannot go to [employer] leave dog,  cannot get transportation.  As we are able to come up with a plan for each of those obstacles, she finds another to prevent any sort of plan. Pt. eventually tells me she has no acute suicidal ideation--no plans.  Signs an agreement to be seen if develops. Discussed pt with MSW at other [facility] site--pt. calmed after I discuss her with counselor and feels she will be okay over weekend until can see Aquilla Solian here next week. Strongly feel her pain is very related to her long standing  psych issues. Spent over 1 hour with patient today.   Problem # 3:  CERUMEN IMPACTION, RIGHT (ICD-380.4) Discussed Debrox, which pt. is not sure she can afford as OTC. She states her Medicaid has been covering meds--will send in Corticosporin with concern for also a mild external otitis. Pt. will return for irrigation after ear drops over the weekend and see if relieves pain.  Complete Medication List: 1)  Flexeril 10 Mg Tabs (Cyclobenzaprine hcl) .Marland Kitchen.. 1 tab by mouth every 8 hours as needed for neck pain 2)  Savella Titration Pack 12.5 & 25 & 50 Mg Misc (Milnacipran hcl) .... Take as directed 3)  Ibuprofen 100 Mg/12ml Susp (Ibuprofen) .... 30ml by mouth daily as needed for pain 4)   Lyrica 75 Mg Caps (Pregabalin) .... Take 1 capsules by mouth bid 5)  Savella 50 Mg Tabs (Milnacipran hcl) .Marland Kitchen.. 1 tab by mouth two times a day  Patient Instructions: 1)  Use the Cortisporin for your ear for 5 days--put a cotton plug in ear--don't push in --just enough to keep medicine in ear.  May gently flush out ear canal with hydrogen peroxide on day 6 and see if wax comes out. 2)  Keep your appt. with Aquilla Solian on Tuesday. 3)  Follow up with Dr. Delrae Alfred in 2 weeks  Prescriptions: CORTISPORIN-TC 3.08-19-08-0.5 MG/ML SUSP (NEOMYCIN-COLIST-HC-THONZONIUM) 4 ggt right ear 4 times daily for 5 days.  #5 day supply x 0   Entered and Authorized by:   Julieanne Manson MD   Signed by:   Julieanne Manson MD on 10/15/2009   Method used:   Electronically to        [facility] [id number] (retail)       [address] [place], [place]  [address]       Ph: [phone]       Fax: [phone]   RxID:   [id number]    Vital Signs:  Patient profile:   30 year old female Height:      67 inches Weight:      151.50 pounds Temp:     98.7 degrees F oral Pulse rate:   78 / minute Pulse rhythm:   regular Resp:     20 per minute BP sitting:   115 / 70  (left arm) Cuff size:   regular  Vitals Entered By: Geanie Cooley  (October 15, 2009 3:32 PM)

## 2010-07-21 NOTE — Progress Notes (Signed)
Summary: CHECKING ON PAIN REFERRAL  Phone Note Call from Patient Call back at Home Phone [phone]   Reason for Call: Referral Summary of Call: Kristen [name] PT Kristen Zellars IS CHECKING UP ON HER REFERRAL TO THE PAIN CLINIC AND TO SEE ABOUT HOW MUCH LONGER. Initial call taken by: Kristen [name],  June 25, 2009 12:55 PM  Follow-up for Phone Call        Refaxed 07/01/09 by Kristen [name]. Follow-up by: Kristen [name],  July 07, 2009 4:42 PM  Additional Follow-up for Phone Call Additional follow up Details #1::        PT STATES IS HAVING PAIN ALL OVER BODY AND SHE STATES THAT SHE IS HAVING CHEST PAIN. INFO WAS DISCUSSED WITH DR Kristen [name] AND PT NEEDS TO BE SCHEDULED TO COME IN TO SEE HER IF AT ALL POSSIBLE. WE WILL FOLLOW UP WITH PAIN CLINC REFERRAL... Additional Follow-up by: Kristen [name],  July 09, 2009 3:30 PM    Additional Follow-up for Phone Call Additional follow up Details #2::    Kristen [name] CALLED THIS MORNING AND SAYS THAT SHE HAS PAIN IN HER EYES, IN HER TEMPLES . SHE SAYS THIS MORNING THAT SHE IS IN HER BEDROOM WITH THE LIGHTS OFF AND ROOM IS PITCH BLACK AND HER EYES AND HEAD IS HURTING BAD. THE PAIN IS SHOOTING IN THE BACK OF HER EYE BAD AND MAKING HER WHOE BODY HURT.Marland KitchenMarland KitchenCala Bradford [name]  July 12, 2009 8:33 AM   Kristen [name], Please contact patient and schedule her to see Kristen [name] tomorrow at 10:45am if she is able to come in and I will get in touch with Kristen [name] regarding her referral or check with Kristen [name] as this may have been one prior to Kristen [name] helping the clinic out. Kristen [name]..........................Marland KitchenMikey [name] [name]  July 12, 2009 11:52 AM    Additional Follow-up for Phone Call Additional follow up Details #3:: Details for Additional Follow-up Action Taken: PATIENT IS SCHED TO SEE Kristen [name] ON 07/13/09. Additional Follow-up by: Kristen [name],  July 13, 2009 10:28 AM

## 2010-07-21 NOTE — Letter (Signed)
Summary: pain management  pain management   Imported By: Arta Bruce 09/06/2009 09:01:45  _____________________________________________________________________  External Attachment:    Type:   Image     Comment:   External Document

## 2010-07-25 ENCOUNTER — Encounter (INDEPENDENT_AMBULATORY_CARE_PROVIDER_SITE_OTHER): Payer: Self-pay | Admitting: Internal Medicine

## 2010-07-27 NOTE — Letter (Signed)
Summary: REQUESTING RECORDS FROM [facility]  REQUESTING RECORDS FROM [facility]   Imported By: Arta Bruce 07/21/2010 11:12:31  _____________________________________________________________________  External Attachment:    Type:   Image     Comment:   External Document

## 2010-08-08 ENCOUNTER — Encounter (INDEPENDENT_AMBULATORY_CARE_PROVIDER_SITE_OTHER): Payer: Self-pay | Admitting: Internal Medicine

## 2010-08-11 ENCOUNTER — Encounter: Payer: Self-pay | Admitting: Internal Medicine

## 2010-08-11 ENCOUNTER — Encounter (INDEPENDENT_AMBULATORY_CARE_PROVIDER_SITE_OTHER): Payer: Self-pay | Admitting: Internal Medicine

## 2010-08-12 ENCOUNTER — Telehealth (INDEPENDENT_AMBULATORY_CARE_PROVIDER_SITE_OTHER): Payer: Self-pay | Admitting: Internal Medicine

## 2010-08-12 ENCOUNTER — Encounter (INDEPENDENT_AMBULATORY_CARE_PROVIDER_SITE_OTHER): Payer: Self-pay | Admitting: Internal Medicine

## 2010-08-16 NOTE — Assessment & Plan Note (Signed)
Summary: f/u    Vital Signs:  Patient profile:   30 year old female Menstrual status:  regular LMP:     08/07/2010 Weight:      148.06 pounds Temp:     97.2 degrees F oral Pulse rate:   70 / minute Pulse rhythm:   regular Resp:     20 per minute BP sitting:   112 / 70  (left arm) Cuff size:   regular  Vitals Entered By: Hale Drone CMA (August 11, 2010 9:28 AM) CC: 1 month f/u. Having pain on legs and feet. Legs loose strength. Arms and hands doing the same. Having chest pains and left ear pain. Can't hold things for a long period. The pain starts at hand and will radiate all the way up to her arms. Having eye pain causing her to have HA's. Has not taken flexeril and rameron due to financial situation. Is Patient Diabetic? No Pain Assessment Patient in pain? yes       Does patient need assistance? Functional Status Self care Ambulation Normal LMP (date): 08/07/2010     Enter LMP: 08/07/2010   CC:  1 month f/u. Having pain on legs and feet. Legs loose strength. Arms and hands doing the same. Having chest pains and left ear pain. Can't hold things for a long period. The pain starts at hand and will radiate all the way up to her arms. Having eye pain causing her to have HA's. Has not taken flexeril and rameron due to financial situation.Marland Kitchen  History of Present Illness: Pt. not taking any medication we discussed at last visit as above. Left without being seen.  Current Medications (verified): 1)  Flexeril 10 Mg Tabs (Cyclobenzaprine Hcl) .Marland Kitchen.. 1 Tab By Mouth Every 8 Hours As Needed For Neck Pain--Generally Use At Bedtime 2)  Lyrica 75 Mg Caps (Pregabalin) .... Take 1 Capsules By Mouth Bid 3)  Remeron 15 Mg Tabs (Mirtazapine) .Marland Kitchen.. 1 Tab By Mouth At Bedtime  Allergies (verified): 1)  ! Tramadol Hcl   Complete Medication List: 1)  Flexeril 10 Mg Tabs (Cyclobenzaprine hcl) .Marland Kitchen.. 1 tab by mouth every 8 hours as needed for neck pain--generally use at bedtime 2)  Lyrica 75 Mg  Caps (Pregabalin) .... Take 1 capsules by mouth bid 3)  Remeron 15 Mg Tabs (Mirtazapine) .Marland Kitchen.. 1 tab by mouth at bedtime   Orders Added: 1)  Est. Patient Level I [[address]]

## 2010-08-16 NOTE — Assessment & Plan Note (Signed)
Summary: LST SEEN JUNE/RENEW MEDS//KT   Vital Signs:  Patient profile:   30 year old female Menstrual status:  regular LMP:     07/10/2010 Weight:      143.19 pounds Temp:     98.2 degrees F oral Pulse rate:   70 / minute Pulse rhythm:   regular Resp:     20 per minute BP sitting:   116 / 80  (left arm) Cuff size:   regular  Vitals Entered By: Hale Drone CMA (July 12, 2010 12:44 PM) CC: Last seen in June. Ran out of meds 2 months ago. Having constant HA's and eyes. Describes her eye pains as a "throbbing pain". Bright light will make it worse. The pain will radiate to her side of her head. Haivng neck pain. Not able to turn her head.  Is Patient Diabetic? No Pain Assessment Patient in pain? yes       Does patient need assistance? Functional Status Self care Ambulation Normal Comments Pt. was crying in the exam room.  LMP (date): 07/10/2010     Menstrual Status regular Enter LMP: 07/10/2010   CC:  Last seen in June. Ran out of meds 2 months ago. Having constant HA's and eyes. Describes her eye pains as a "throbbing pain". Bright light will make it worse. The pain will radiate to her side of her head. Haivng neck pain. Not able to turn her head. Marland Kitchen  History of Present Illness: 1.  Psych issues:  At [facility].  Will be seeing "Chanetta Marshall"  20th of February.  Not on any antidepressant or other medication currently.  No longer following with Marchelle Folks with counseling.  Pt. apparently when started at [facility], was taking both Savella and Zoloft--the Zoloft was to be discontinued in April because she was not taking appropriately.  The Amada Jupiter was not started until June.  We were sent a note that she should not be taking both.  Not clear if she was ever on any medication through [facility] like she missed an appt. there and has not been there for a bit.  She cannot recall when last visit occurred--possibly September.     2.  Chronic Pain Syndrome:  went to [facility] since being seen here--was trying to get set up with Pain Management, but apparently not able to get set up with that.  Pt. upset that no one is helping her.  Not clear if her meds are covered on her Medicaid card.  Pt. states she does not sleep well.    Current Medications (verified): 1)  Flexeril 10 Mg Tabs (Cyclobenzaprine Hcl) .Marland Kitchen.. 1 Tab By Mouth Every 8 Hours As Needed For Neck Pain--Generally Use At Bedtime 2)  Ibuprofen 100 Mg/77ml Susp (Ibuprofen) .... 30ml By Mouth Daily As Needed For Pain 3)  Lyrica 75 Mg Caps (Pregabalin) .... Take 1 Capsules By Mouth Bid 4)  Savella 50 Mg Tabs (Milnacipran Hcl) .Marland Kitchen.. 1 Tab By Mouth Two Times A Day  Allergies (verified): 1)  ! Tramadol Hcl  Physical Exam  General:  Angry, then tearful. Lungs:  Normal respiratory effort, chest expands symmetrically. Lungs are clear to auscultation, no crackles or wheezes. Heart:  Normal rate and regular rhythm. S1 and S2 normal without gallop, murmur, click, rub or other extra sounds. Msk:  Jumps in pain before any actual contact on back, neck.   Impression & Recommendations:  Problem # 1:  CHRONIC PAIN SYNDROME (ICD-338.4) Not clear will make headway with patient  Noncompliance a large issue as are psych issues Start Remeron to help with depression, pain and sleep Restart Lyrica Fill muscle relaxant.  Problem # 2:  BIPOLAR DISORDER UNSPECIFIED (ICD-296.80) Not clear if this is her diagnosis.--send for records from [facility]  Complete Medication List: 1)  Flexeril 10 Mg Tabs (Cyclobenzaprine hcl) .Marland Kitchen.. 1 tab by mouth every 8 hours as needed for neck pain--generally use at bedtime 2)  Lyrica 75 Mg Caps (Pregabalin) .... Take 1 capsules by mouth bid 3)  Remeron 15 Mg Tabs (Mirtazapine) .Marland Kitchen.. 1 tab by mouth at bedtime  Patient Instructions: 1)  Release of information from [facility] for continuity of care 2)  Follow up with Dr. Delrae Alfred in 1 month ---Depression Prescriptions: FLEXERIL 10  MG TABS (CYCLOBENZAPRINE HCL) 1 tab by mouth every 8 hours as needed for neck pain--generally use at bedtime  #60 x 0   Entered and Authorized by:   Julieanne Manson MD   Signed by:   Julieanne Manson MD on 07/12/2010   Method used:   Print then Give to Patient   RxID:   [id number] LYRICA 75 MG CAPS (PREGABALIN) Take 1 capsules by mouth bid  #60 x 0   Entered and Authorized by:   Julieanne Manson MD   Signed by:   Julieanne Manson MD on 07/12/2010   Method used:   Print then Give to Patient   RxID:   [id number] REMERON 15 MG TABS (MIRTAZAPINE) 1 tab by mouth at bedtime  #30 x 1   Entered and Authorized by:   Julieanne Manson MD   Signed by:   Julieanne Manson MD on 07/12/2010   Method used:   Faxed to ...       [facility] (retail)       [address] [place], [place]  [address]       Ph: [phone]       Fax: [phone]   RxID:   [id number]    Orders Added: 1)  Est. Patient Level III [56433]   Flu Vaccine Consent Questions:    Do you have a history of severe allergic reactions to this vaccine? no    Any prior history of allergic reactions to egg and/or gelatin? no    Do you have a sensitivity to the preservative Thimersol? no    Do you have a past history of Guillan-Barre Syndrome? no    Do you currently have an acute febrile illness? no    Have you ever had a severe reaction to latex? no    Vaccine information given and explained to patient? yes    Are you currently pregnant? no

## 2010-08-16 NOTE — Miscellaneous (Signed)
  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  Would recommend Dialectical Group Behavior Therapy--can possible obtain at [facility].

## 2010-08-19 ENCOUNTER — Telehealth (INDEPENDENT_AMBULATORY_CARE_PROVIDER_SITE_OTHER): Payer: Self-pay | Admitting: Internal Medicine

## 2010-08-22 ENCOUNTER — Encounter (INDEPENDENT_AMBULATORY_CARE_PROVIDER_SITE_OTHER): Payer: Self-pay | Admitting: Internal Medicine

## 2010-08-23 ENCOUNTER — Encounter (INDEPENDENT_AMBULATORY_CARE_PROVIDER_SITE_OTHER): Payer: Self-pay | Admitting: Internal Medicine

## 2010-08-25 NOTE — Progress Notes (Signed)
Summary: DIALECTICAL GROUP BEHAVIOR THERAPY   Phone Note Outgoing Call   Summary of Call: Nora--can you check with [facility] program and see if they have a Dialectical Group Behavior Therapy program through their psych dept?  If so, would like to refer to that.  Thanks Initial call taken by: Julieanne Manson MD,  August 12, 2010 8:52 AM  Follow-up for Phone Call        I call [facility] they just start a program  but if they have any cancelations I give the phone number of the pt and they can call her . and also they offer a Screening appt for individual therapy but the pt need to call and made the appt. Follow-up by: Cheryll Dessert,  August 12, 2010 11:24 AM  Additional Follow-up for Phone Call Additional follow up Details #1::        Please call pt. next week and give her the number--let her know this was something Aquilla Solian recommended. Additional Follow-up by: Julieanne Manson MD,  August 12, 2010 3:31 PM    Additional Follow-up for Phone Call Additional follow up Details #2::    PT HAS AN APPT 09-01-10 @ 9:30 AM [facility] PT AWARE OF HER APPT  Follow-up by: Cheryll Dessert,  August 18, 2010 12:04 PM

## 2010-08-26 ENCOUNTER — Inpatient Hospital Stay (INDEPENDENT_AMBULATORY_CARE_PROVIDER_SITE_OTHER)
Admission: RE | Admit: 2010-08-26 | Discharge: 2010-08-26 | Disposition: A | Discharge status: Home / Self Care | Payer: Self-pay | Source: Ambulatory Visit | Attending: Family Medicine | Admitting: Family Medicine

## 2010-08-26 ENCOUNTER — Telehealth (INDEPENDENT_AMBULATORY_CARE_PROVIDER_SITE_OTHER): Payer: Self-pay | Admitting: Internal Medicine

## 2010-08-26 DIAGNOSIS — K047 Periapical abscess without sinus: Secondary | ICD-10-CM

## 2010-08-30 NOTE — Progress Notes (Signed)
Summary: Pain clinic referral--[facility]  Phone Note Call from Patient   Summary of Call: Wants referral to [facility]--call her back at [phone] They need her records sent as well--which is fine.  Order written Initial call taken by: Julieanne Manson MD,  August 19, 2010 2:31 PM  Follow-up for Phone Call        SENT REFERRAL TO THE [facility]  WITH THE L[facility] T 3 OV WAITING FOR AN APPT  Follow-up by: Cheryll Dessert,  August 25, 2010 3:33 PM

## 2010-08-30 NOTE — Letter (Signed)
Summary: RECEIVED RECORDS FROM [facility]  RECEIVED RECORDS FROM [facility]   Imported By: Arta Bruce 08/22/2010 08:54:27  _____________________________________________________________________  External Attachment:    Type:   Image     Comment:   External Document

## 2010-08-30 NOTE — Letter (Signed)
Summary: PT REQUEST TO GO TO PAIN MANAGEMENT  PT REQUEST TO GO TO PAIN MANAGEMENT   Imported By: Arta Bruce 08/22/2010 08:22:14  _____________________________________________________________________  External Attachment:    Type:   Image     Comment:   External Document

## 2010-08-30 NOTE — Letter (Signed)
Summary: FAXED REQUESTED RECORDS TO [facility]  FAXED REQUESTED RECORDS TO [facility]   Imported By: Arta Bruce 08/23/2010 15:20:34  _____________________________________________________________________  External Attachment:    Type:   Image     Comment:   External Document

## 2010-09-02 ENCOUNTER — Telehealth (INDEPENDENT_AMBULATORY_CARE_PROVIDER_SITE_OTHER): Payer: Self-pay | Admitting: Internal Medicine

## 2010-09-02 LAB — URINALYSIS, ROUTINE W REFLEX MICROSCOPIC
Bilirubin Urine: NEGATIVE
Hgb urine dipstick: NEGATIVE
Nitrite: NEGATIVE
Protein, ur: NEGATIVE mg/dL
Specific Gravity, Urine: 1.016 (ref 1.005–1.030)
pH: 5.5 (ref 5.0–8.0)

## 2010-09-02 LAB — POCT I-STAT, CHEM 8
BUN: 15 mg/dL (ref 6–23)
Calcium, Ion: 1.09 mmol/L — ABNORMAL LOW (ref 1.12–1.32)
Chloride: 104 mEq/L (ref 96–112)
Creatinine, Ser: 1.1 mg/dL (ref 0.4–1.2)
Glucose, Bld: 96 mg/dL (ref 70–99)
HCT: 41 % (ref 36.0–46.0)
Hemoglobin: 13.9 g/dL (ref 12.0–15.0)
Sodium: 136 mEq/L (ref 135–145)
TCO2: 23 mmol/L (ref 0–100)

## 2010-09-07 ENCOUNTER — Encounter: Payer: Self-pay | Admitting: Internal Medicine

## 2010-09-07 ENCOUNTER — Encounter (INDEPENDENT_AMBULATORY_CARE_PROVIDER_SITE_OTHER): Payer: Self-pay | Admitting: Internal Medicine

## 2010-09-07 DIAGNOSIS — F329 Major depressive disorder, single episode, unspecified: Secondary | ICD-10-CM | POA: Insufficient documentation

## 2010-09-07 DIAGNOSIS — F3289 Other specified depressive episodes: Secondary | ICD-10-CM | POA: Insufficient documentation

## 2010-09-15 NOTE — Assessment & Plan Note (Signed)
Summary: f/u for pain on hands and multiple issues   Vital Signs:  Patient profile:   30 year old female Menstrual status:  regular LMP:     09/02/2010 Weight:      148.13 pounds BMI:     23.28 Temp:     98.2 degrees F oral Pulse rate:   61 / minute Pulse rhythm:   regular Resp:     18 per minute BP sitting:   109 / 59  (left arm) Cuff size:   regular  Vitals Entered By: Hale Drone CMA (September 07, 2010 12:43 PM) CC: Having HA's and eye pain... Hands are hurting w/a sharp throbbing pain.Marland Kitchen Has a Rx from [facility] for Napralen Is Patient Diabetic? No Pain Assessment Patient in pain? yes       Does patient need assistance? Functional Status Self care Ambulation Normal LMP (date): 09/02/2010     Enter LMP: 09/02/2010   CC:  Having HA's and eye pain... Hands are hurting w/a sharp throbbing pain.Marland Kitchen Has a Rx from [facility] for Napralen.  History of Present Illness: 1.  Personality Disorder:  Has been to [facility] program.  Waiting to hear back from them regarding follow up.  She is not sure what therapy is planned at this point.  2.  Chronic Pain Syndrome:  has established with [facility].  They are starting her on Naprelan--I cosigned today  3.  Depression:  Pt. states she could not figure out what pharmacy  Current Medications (verified): 1)  Flexeril 10 Mg Tabs (Cyclobenzaprine Hcl) .Marland Kitchen.. 1 Tab By Mouth Every 8 Hours As Needed For Neck Pain--Generally Use At Bedtime 2)  Lyrica 75 Mg Caps (Pregabalin) .... Take 1 Capsules By Mouth Bid 3)  Remeron 15 Mg Tabs (Mirtazapine) .Marland Kitchen.. 1 Tab By Mouth At Bedtime 4)  Naprelan 750 Mg Xr24h-Tab (Naproxen Sodium) .Marland Kitchen.. 1 Tab Daily W/food ([facility])  Allergies (verified): 1)  ! Tramadol Hcl  Physical Exam  General:  NAD Lungs:  Normal respiratory effort, chest expands symmetrically. Lungs are clear to auscultation, no crackles or wheezes. Heart:  Normal rate and regular  rhythm. S1 and S2 normal without gallop, murmur, click, rub or other extra sounds.   Impression & Recommendations:  Problem # 1:  HISTRIONIC OR BORDERLINE  PERSONALITY DISORDER (ICD-301.50) Continue with [facility] program  Problem # 2:  CHRONIC PAIN SYNDROME (ICD-338.4) As per [facility]--all pain meds through them now.  Problem # 3:  INSOMNIA (ICD-780.52) Chronic pain and depression--to get started on Remeron.  Complete Medication List: 1)  Flexeril 10 Mg Tabs (Cyclobenzaprine hcl) .Marland Kitchen.. 1 tab by mouth every 8 hours as needed for neck pain--generally use at bedtime 2)  Lyrica 75 Mg Caps (Pregabalin) .... Take 1 capsules by mouth bid 3)  Remeron 15 Mg Tabs (Mirtazapine) .Marland Kitchen.. 1 tab by mouth at bedtime 4)  Naprelan 750 Mg Xr24h-tab (Naproxen sodium) .Marland Kitchen.. 1 tab daily w/food ([facility])  Patient Instructions: 1)  Stop the Savella 2)  Get started on Lyrica 3)  Get the Remeron filled and stay on it. 4)  Follow up with Dr. Delrae Alfred in 6 weeks Prescriptions: REMERON 15 MG TABS (MIRTAZAPINE) 1 tab by mouth at bedtime  #30 x 1   Entered and Authorized by:   Julieanne Manson MD   Signed by:   Julieanne Manson MD on 09/07/2010   Method used:   Faxed to .Marland KitchenMarland Kitchen  [facility] (retail)       [address] [place], [place]  [address]       Ph: [phone]       Fax: [phone]   RxID:   [id number]    Orders Added: 1)  Est. Patient Level III [27253]  Appended Document: f/u for pain on hands and multiple issues OV signed off before finished. Regarding her Chronic pain syndrome and Depression:  pt. has not yet filled her Lyrica Rx from 1/12.  Stated she still had an old Rx, but sounds like only taking intermittently.  Unlikely she is taking as last fill before 1/12 was in 2010--stopped thereafter for noncompliance with meds.  Sounds like she is still taking Savella intermittently.  I do not see that [facility]  filled any psych meds in Feb.  when she was seen.  We have not filled Savella--in fact, she was only given an 80 day supply of samples back in 11/2009.  Discussed she needs to fill her meds and stay on them.   Pt. wanted to discuss her pain syndrome today and was upset after the visit that her complaints were not being addressed.  Encouraged her to start her meds to see if they help with her complaints.  Discussed all pain meds would have to go through [facility].  They have prescribed her Naprelan/Naproxen.   Clinical Lists Changes

## 2010-09-15 NOTE — Progress Notes (Signed)
Summary: [facility]sch-urgent  Phone Note Call from Patient   Summary of Call: Provider out, pat called for rsch, pat states cannot wait for next availability. Please call pat. Initial call taken by: Ernestine Mcmurray,  September 02, 2010 8:20 AM  Follow-up for Phone Call        Spoke with pt. -- states that she is OK to wait for next scheduled appt. with Dr. Delrae Alfred on 09/16/10.  Dutch Quint RN  September 05, 2010 11:56 AM

## 2010-09-15 NOTE — Progress Notes (Signed)
Summary: NEEDS MED AND DENTAL REFERRAL  Phone Note Call from Patient Call back at Home Phone [phone]   Reason for Call: Refill Medication Summary of Call: Damiah Mcdonald PT. MS Yo CALLED AND SAYS THAT SHE NEVER RECEIVED HER REMERON RX FROM HER JANUARY 24th VISIT. SHE WOULD LIKE FOR YOU TO CALL IT INTO [facility]. AND THIS IS WHERE SHE WILL BE GETTING HER OTHER MEDS INSTEAD OF [place].   AND SHE ALSO SAW A DENTIST TODAY AND WAS GIVEN A ANTIBIOTIC AND WAS TOLD SHE HAS AN ABCESS AND SHE WANTS [place] TO SEND A REFERRAL TO THE DENTAL CLINIC IF POSSIBLE. Initial call taken by: Leodis Rains,  August 26, 2010 2:51 PM  Follow-up for Phone Call        Per [facility], [facility] has no record of having received Rx.  Do you want to refill Remeron per protocol or just call with original Rx?   Pt. also has appt. on 09/02/10 with provider -- confirmed with pt. -- and advised that Dr. can assess her dental needs during that appointment - verbalized understanding and agreement.  Dutch Quint RN  August 30, 2010 4:43 PM  She is bouncing back and forth between providers.  She needs to go to the [facility] and stick with them.  When I last saw her she had a follow up appt. with "Jimmy"  there.   [facility] never received her med because it was sent to [facility] pharmacy as was her Lyrica, which she apparently is not taking as well.  She is listed as having Medicaid, but tells [place] it does not cover her prescriptions.  She needs to check with [facility] pharmacy. Her meds were written in January.   She should have been started on something else from [facility] send for her records for continuity of care--since January please.  Julieanne Manson MD  September 01, 2010 3:30 PM   Additional Follow-up for Phone Call Additional follow up Details #1::        Spoke with Marchelle Folks at [facility].  Sending notes.  Dutch Quint RN  September 05, 2010 11:45 AM  Per our pharmacy, there is nothing in  her active profile that they ever filled.  Received records from [facility] -- on your desk.  Dutch Quint RN  September 06, 2010 9:47 AM  Received records from [facility], dated 08/08/10:  They have documented that she is taking Savella, which was discontinued in January--and not sure she was really taking it then.   If she wants to start the Remeron and is not taking any other antidepressant, she can--please call in the Remeron as previously prescribed to the pharmacy of her choice, but would expect it is still at [facility] pharmacy as well.   She will need a follow up with me in 4-6 weeks after starting.  I was out last week when she was to follow up.  Additional Follow-up by: Julieanne Manson MD,  September 06, 2010 2:08 PM    Additional Follow-up for Phone Call Additional follow up Details #2::    In office.  Dutch Quint RN  September 07, 2010 11:59 AM Pt. in office yesterday. Julieanne Manson MD  September 08, 2010 5:37 AM

## 2010-09-15 NOTE — Letter (Signed)
[facility]   Imported By: Arta Bruce 09/08/2010 15:41:51  _____________________________________________________________________  External Attachment:    Type:   Image     Comment:   External Document

## 2010-09-21 LAB — DIFFERENTIAL
Basophils Absolute: 0.2 10*3/uL — ABNORMAL HIGH (ref 0.0–0.1)
Basophils Relative: 2 % — ABNORMAL HIGH (ref 0–1)
Lymphocytes Relative: 32 % (ref 12–46)
Lymphs Abs: 2.9 10*3/uL (ref 0.7–4.0)

## 2010-09-21 LAB — CBC
MCHC: 34.3 g/dL (ref 30.0–36.0)
MCV: 94.3 fL (ref 78.0–100.0)
WBC: 9.1 10*3/uL (ref 4.0–10.5)

## 2010-09-21 LAB — T3, FREE: T3, Free: 2.9 pg/mL (ref 2.3–4.2)

## 2010-09-29 LAB — DIFFERENTIAL
Basophils Relative: 0 % (ref 0–1)
Eosinophils Absolute: 0.3 10*3/uL (ref 0.0–0.7)
Eosinophils Relative: 4 % (ref 0–5)
Lymphs Abs: 1.7 10*3/uL (ref 0.7–4.0)
Monocytes Relative: 6 % (ref 3–12)

## 2010-09-29 LAB — POCT I-STAT, CHEM 8
Calcium, Ion: 1.15 mmol/L (ref 1.12–1.32)
Creatinine, Ser: 0.8 mg/dL (ref 0.4–1.2)
Glucose, Bld: 116 mg/dL — ABNORMAL HIGH (ref 70–99)
HCT: 40 % (ref 36.0–46.0)
Hemoglobin: 13.6 g/dL (ref 12.0–15.0)
TCO2: 23 mmol/L (ref 0–100)

## 2010-09-29 LAB — CBC
HCT: 37.3 % (ref 36.0–46.0)
MCHC: 34.3 g/dL (ref 30.0–36.0)
MCV: 92.4 fL (ref 78.0–100.0)
RBC: 4.04 MIL/uL (ref 3.87–5.11)
WBC: 8.5 10*3/uL (ref 4.0–10.5)

## 2010-09-29 LAB — URINALYSIS, ROUTINE W REFLEX MICROSCOPIC
Bilirubin Urine: NEGATIVE
Hgb urine dipstick: NEGATIVE
Ketones, ur: NEGATIVE mg/dL
Protein, ur: NEGATIVE mg/dL
Urobilinogen, UA: 0.2 mg/dL (ref 0.0–1.0)

## 2010-11-04 NOTE — Group Therapy Note (Signed)
[name], [name] NO.:  [id number]   MEDICAL RECORD NO.:  [id number]          PATIENT TYPE:  WOC   LOCATION:  [facility]                   FACILITY:  [facility]   PHYSICIAN:  Ellis Parents, MD    DATE OF BIRTH:  [birth year]   DATE OF SERVICE:  09/19/2006                                  CLINIC NOTE   SUBJECTIVE:  A 30 year old female who presents for followup of her  pelvic ultrasound at [facility] on February 14.  The ultrasound showed  bilateral complex septated cysts, right measuring 4.2x3.8, left  measuring 6.4x5.4.  She is currently without complaints.  She is not  currently having any abdominal pain.  She has normal menses.  Last  period began on March 17 and lasted 5 days.  Her periods began at the  age of 41.  She cannot remember the last time she had a Pap smear and  does complain of brown discharge over the last several months with a  fishy odor.   OBJECTIVE:  External exam within normal limits.  There is some brown  watery discharge with an odor.  Cervical exam is within normal limits.   ASSESSMENT:  A 30 year old gravida 0 with bacterial vaginosis and  ovarian cyst.   PLAN:  1. Metronidazole 500 mg once daily for 7 days.  2. Will schedule ultrasound for 21 of April.  Will follow up with the      patient if ultrasound is abnormal.  3. The patient is to follow up in 1 year for annual exam.     ______________________________  Ellis Parents, MD    ______________________________  Ellis Parents, MD    SA/MEDQ  D:  09/19/2006  T:  09/19/2006  Job:  [id number]

## 2010-11-26 ENCOUNTER — Inpatient Hospital Stay (INDEPENDENT_AMBULATORY_CARE_PROVIDER_SITE_OTHER)
Admission: RE | Admit: 2010-11-26 | Discharge: 2010-11-26 | Disposition: A | Discharge status: Home / Self Care | Payer: Self-pay | Source: Ambulatory Visit | Attending: Emergency Medicine | Admitting: Emergency Medicine

## 2010-11-26 DIAGNOSIS — L509 Urticaria, unspecified: Secondary | ICD-10-CM

## 2011-03-30 LAB — POCT URINALYSIS DIP (DEVICE)
Bilirubin Urine: NEGATIVE
Glucose, UA: NEGATIVE
Hgb urine dipstick: NEGATIVE
Ketones, ur: NEGATIVE
Nitrite: NEGATIVE
Operator id: 126491
Protein, ur: NEGATIVE
Specific Gravity, Urine: 1.015
Urobilinogen, UA: 1
pH: 7.5

## 2011-03-30 LAB — POCT PREGNANCY, URINE: Preg Test, Ur: NEGATIVE

## 2011-04-25 ENCOUNTER — Ambulatory Visit (HOSPITAL_COMMUNITY)
Admission: RE | Admit: 2011-04-25 | Discharge: 2011-04-25 | Disposition: A | Discharge status: Home / Self Care | Payer: Medicaid Other | Source: Ambulatory Visit | Attending: Internal Medicine | Admitting: Internal Medicine

## 2011-04-25 ENCOUNTER — Other Ambulatory Visit: Payer: Self-pay | Admitting: Internal Medicine

## 2011-04-25 DIAGNOSIS — M549 Dorsalgia, unspecified: Secondary | ICD-10-CM | POA: Insufficient documentation

## 2011-04-25 DIAGNOSIS — R52 Pain, unspecified: Secondary | ICD-10-CM

## 2011-05-22 ENCOUNTER — Ambulatory Visit: Payer: Self-pay | Admitting: Physical Medicine & Rehabilitation

## 2011-06-29 ENCOUNTER — Encounter: Payer: Medicaid Other | Attending: Physical Medicine & Rehabilitation

## 2011-06-29 ENCOUNTER — Ambulatory Visit: Payer: Medicare Other | Admitting: Physical Medicine & Rehabilitation

## 2011-06-29 DIAGNOSIS — R209 Unspecified disturbances of skin sensation: Secondary | ICD-10-CM

## 2011-07-11 ENCOUNTER — Other Ambulatory Visit: Payer: Self-pay | Admitting: Internal Medicine

## 2011-07-20 ENCOUNTER — Other Ambulatory Visit: Payer: Self-pay | Admitting: Internal Medicine

## 2011-07-20 DIAGNOSIS — R102 Pelvic and perineal pain: Secondary | ICD-10-CM

## 2011-07-25 ENCOUNTER — Inpatient Hospital Stay (HOSPITAL_COMMUNITY): Admission: RE | Admit: 2011-07-25 | Payer: Medicare Other | Source: Ambulatory Visit

## 2011-07-25 ENCOUNTER — Other Ambulatory Visit (HOSPITAL_COMMUNITY): Payer: Medicare Other

## 2011-07-28 ENCOUNTER — Ambulatory Visit (HOSPITAL_COMMUNITY)
Admission: RE | Admit: 2011-07-28 | Discharge: 2011-07-28 | Disposition: A | Discharge status: Home / Self Care | Payer: Medicare Other | Source: Ambulatory Visit | Attending: Internal Medicine | Admitting: Internal Medicine

## 2011-07-28 DIAGNOSIS — D252 Subserosal leiomyoma of uterus: Secondary | ICD-10-CM | POA: Insufficient documentation

## 2011-07-28 DIAGNOSIS — N949 Unspecified condition associated with female genital organs and menstrual cycle: Secondary | ICD-10-CM | POA: Insufficient documentation

## 2011-07-28 DIAGNOSIS — R102 Pelvic and perineal pain: Secondary | ICD-10-CM

## 2011-10-10 ENCOUNTER — Encounter (HOSPITAL_COMMUNITY): Payer: Self-pay | Admitting: *Deleted

## 2011-10-10 ENCOUNTER — Encounter: Payer: Self-pay | Admitting: Obstetrics & Gynecology

## 2011-10-10 ENCOUNTER — Inpatient Hospital Stay (HOSPITAL_COMMUNITY)
Admission: AD | Admit: 2011-10-10 | Discharge: 2011-10-10 | Disposition: A | Discharge status: Home / Self Care | Payer: Medicare Other | Source: Ambulatory Visit | Attending: Obstetrics & Gynecology | Admitting: Obstetrics & Gynecology

## 2011-10-10 DIAGNOSIS — N920 Excessive and frequent menstruation with regular cycle: Secondary | ICD-10-CM | POA: Insufficient documentation

## 2011-10-10 DIAGNOSIS — R109 Unspecified abdominal pain: Secondary | ICD-10-CM | POA: Insufficient documentation

## 2011-10-10 DIAGNOSIS — D259 Leiomyoma of uterus, unspecified: Secondary | ICD-10-CM

## 2011-10-10 DIAGNOSIS — A599 Trichomoniasis, unspecified: Secondary | ICD-10-CM

## 2011-10-10 DIAGNOSIS — A5901 Trichomonal vulvovaginitis: Secondary | ICD-10-CM | POA: Insufficient documentation

## 2011-10-10 HISTORY — DX: Headache: R51

## 2011-10-10 HISTORY — DX: Depression, unspecified: F32.A

## 2011-10-10 HISTORY — DX: Fibromyalgia: M79.7

## 2011-10-10 HISTORY — DX: Prediabetes: R73.03

## 2011-10-10 HISTORY — DX: Anxiety disorder, unspecified: F41.9

## 2011-10-10 HISTORY — DX: Sickle-cell trait: D57.3

## 2011-10-10 HISTORY — DX: Umbilical hernia without obstruction or gangrene: K42.9

## 2011-10-10 HISTORY — DX: Scoliosis, unspecified: M41.9

## 2011-10-10 HISTORY — DX: Panic disorder (episodic paroxysmal anxiety): F41.0

## 2011-10-10 HISTORY — DX: Unspecified osteoarthritis, unspecified site: M19.90

## 2011-10-10 HISTORY — DX: Obsessive-compulsive disorder, unspecified: F42.9

## 2011-10-10 HISTORY — DX: Carpal tunnel syndrome, bilateral upper limbs: G56.03

## 2011-10-10 HISTORY — DX: Major depressive disorder, single episode, unspecified: F32.9

## 2011-10-10 LAB — CBC
HCT: 35.9 % — ABNORMAL LOW (ref 36.0–46.0)
Hemoglobin: 12.5 g/dL (ref 12.0–15.0)
MCH: 30.6 pg (ref 26.0–34.0)
RBC: 4.09 MIL/uL (ref 3.87–5.11)

## 2011-10-10 LAB — WET PREP, GENITAL

## 2011-10-10 MED ORDER — KETOROLAC TROMETHAMINE 30 MG/ML IJ SOLN
30.0000 mg | Freq: Four times a day (QID) | INTRAMUSCULAR | Status: DC | PRN
  Administered 2011-10-10: 30 mg via INTRAMUSCULAR
  Filled 2011-10-10: qty 1

## 2011-10-10 MED ORDER — IBUPROFEN 600 MG PO TABS: 600.0000 mg | ORAL_TABLET | Freq: Four times a day (QID) | ORAL | Status: AC | PRN

## 2011-10-10 MED ORDER — METRONIDAZOLE 500 MG PO TABS: 500.0000 mg | ORAL_TABLET | Freq: Once | ORAL | Status: AC

## 2011-10-10 NOTE — Discharge Instructions (Signed)
Fibroids Fibroids are lumps (tumors) that can occur any place in a woman's body. These lumps are not cancerous. Fibroids vary in size, weight, and where they grow. HOME CARE  Do not take aspirin.   Write down the number of pads or tampons you use during your period. Tell your doctor. This can help determine the best treatment for you.  GET HELP RIGHT AWAY IF:  You have pain in your lower belly (abdomen) that is not helped with medicine.   You have cramps that are not helped with medicine.   You have more bleeding between or during your period.   You feel lightheaded or pass out (faint).   Your lower belly pain gets worse.  MAKE SURE YOU:  Understand these instructions.   Will watch your condition.   Will get help right away if you are not doing well or get worse.  Document Released: 07/08/2010 Document Revised: 05/25/2011 Document Reviewed: 07/08/2010 Exit[employer] Patient Information 2012 [employer]. 

## 2011-10-10 NOTE — MAU Note (Signed)
Had a u/s at [facility] about 5 weeks ago and confirmed that pt had 2 fibroids about "the size of a grapefruit" ; pain started about 3 weeks ago and has progressively gotten worse;also c/o of hard swollen area behind both knees and wants to have is assessed; pt has harrington rods in her back; pt has multiple health and psych issues;

## 2011-10-10 NOTE — MAU Provider Note (Signed)
Kristen [name] Cameron31 y.o.G0P0 Chief Complaint  Patient presents with  . Abdominal Pain     First Provider Initiated Contact with Patient 10/10/11 [phone]      SUBJECTIVE  HPI: Presents with bleeding described as heavy for 5 days every month and lighter but continuous every other day requiring several pads per day since before she had a Depo-Provera shot for menorrhagia in February 2013. She describes as crampy abdominal pain more pronounced in the lower abdomen but pain is diffuse. Bowel movements are normal and frequency but she does have a feeling of incomplete emptying after defecation. She also endorses frequency of urination without dysuria. Not having intercourse x several months due to dysparunia. She gets little relief from ibuprofen up to 600 mg at a time. She states she's also Lyrica and on Flexeril due to chronic neck pain and fibromyalgia. She had [place] for pelvic pain 2 months ago but is not well-informed about having fibroids and is aware she has cyst on her ovaries. Please see [place] report below from 07/28/2011. Would like treatment for the fibroids and wants to preserve her fertility. She does not want another Depo-Provera shot which is due in May.  She had a Pap smear that was normal 2 months ago at [facility].   Past Medical History  Diagnosis Date  . Sickle cell trait   . Fibromyalgia   . Borderline diabetes   . Scoliosis   . Hernia, umbilical   . Carpal tunnel syndrome on both sides   . Arthritis   . Depression   . Anxiety   . Panic attack   . OCD (obsessive compulsive disorder)   . Headache    Past Surgical History  Procedure Date  . Back surgery    History   Social History  . Marital Status: Single    Spouse Name: N/A    Number of Children: N/A  . Years of Education: N/A   Occupational History  . Not on file.   Social History Main Topics  . Smoking status: Current Everyday Smoker -- 0.5 packs/day  . Smokeless tobacco: Not on file  . Alcohol Use: No  . Drug  Use: No  . Sexually Active:    Other Topics Concern  . Not on file   Social History Narrative  . No narrative on file   No current facility-administered medications on file prior to encounter.   No current outpatient prescriptions on file prior to encounter.   Allergies  Allergen Reactions  . Tramadol Hcl     ROS: Pertinent items in HPI  OBJECTIVE Blood pressure 145/82, pulse 83, temperature 98.3 F (36.8 C), temperature source Oral, resp. rate 20, height 5' 6.5" (1.689 m), weight 68.493 kg (151 lb), last menstrual period 09/26/2011. GENERAL: Well-developed, well-nourished female in no acute distress.  ABDOMEN: Soft, minimally tender, no guarding, normal BS EXTREMITIES: Nontender, no edema SPECULUM EXAM: NEFG, small amt dark blood noted, cervix clean BIMANUAL: cervix nulliparous; uterus somewhat tender and LUS fibroid palpable;  adnexal  masses   LAB RESULTS Results for orders placed during the hospital encounter of 10/10/11 (from the past 24 hour(s))  CBC     Status: Abnormal   Collection Time   10/10/11 10:35 AM      Component Value Range   WBC 7.0  4.0 - 10.5 (K/uL)   RBC 4.09  3.87 - 5.11 (MIL/uL)   Hemoglobin 12.5  12.0 - 15.0 (g/dL)   HCT 96.0 (*) 45.4 - 46.0 (%)   MCV  87.8  78.0 - 100.0 (fL)   MCH 30.6  26.0 - 34.0 (pg)   MCHC 34.8  30.0 - 36.0 (g/dL)   RDW 40.9  81.1 - 91.4 (%)   Platelets 304  150 - 400 (K/uL)  WET PREP, GENITAL     Status: Abnormal   Collection Time   10/10/11 11:10 AM      Component Value Range   Yeast Wet Prep HPF POC NONE SEEN  NONE SEEN    Trich, Wet Prep MODERATE (*) NONE SEEN    Clue Cells Wet Prep HPF POC FEW (*) NONE SEEN    WBC, Wet Prep HPF POC FEW (*) NONE SEEN     Study Result     *RADIOLOGY REPORT*  Clinical Data: Pelvic pain  TRANSABDOMINAL AND TRANSVAGINAL ULTRASOUND OF PELVIS  Technique: Both transabdominal and transvaginal ultrasound  examinations of the pelvis were performed. Transabdominal technique  was  performed for global imaging of the pelvis including uterus,  ovaries, adnexal regions, and pelvic cul-de-sac.  Comparison: October 10, 2006  It was necessary to proceed with endovaginal exam following the  transabdominal exam to visualize the endometrium and adnexa.  Findings:  The uterus is enlarged and lobular in contour, measuring 9.3 x 5.6  x 6.3 cm. There are multiple fibroids. The largest is broad-based  subserosal at the posterior lower uterine segment, measuring 4.1  cm. There is a 3 cm posterior mid segment myometrial fibroid, a  2.6 cm fundal myometrial fibroid, and a 2.1 cm anterior fundal  myometrial fibroid, as well. The endometrial stripe is homogeneous  and within normal limits, measuring 10 mm in width.  The right ovary has a normal appearance and measures 4.3 x 3.9 x  3.4 cm.  The left ovary measures 3.4 x 2.5 x 2.9 cm. There is a 2 cm  hyperechoic area adjacent to the left ovary which is suspicious for  a dermoid tumor and not significantly changed compared with the CT  scan dated July 09, 2009. There is no free pelvic fluid.  IMPRESSION: Fibroid uterus. The fibroids have increased in size  and number compared with the prior study. The largest fibroid  today measures 4.1 cm and the largest on the previous study  measured 1.7 cm.  Stable left ovarian dermoid tumor.  Original Report Authenticated By: Brandon Melnick, M.D.            External Result Report     External Result Report             Imaging Information            Signed by       Signed  Date/Time    Phone  Pager    Dorthula Nettles  07/28/2011        MAU Course: Received Toradol with relief of pain  ASSESSMENT Myomatous uterus with menometrorrhagia and pain Trichomonas vaginalis PLAN Rx ibuprofen 800 mg q 8h, Flagyl 2 gm po  and F/U at [facility]     [facility] 10/10/2011 10:25 AM

## 2011-10-11 LAB — GC/CHLAMYDIA PROBE AMP, GENITAL: GC Probe Amp, Genital: NEGATIVE

## 2011-11-15 ENCOUNTER — Encounter: Payer: Self-pay | Admitting: Obstetrics & Gynecology

## 2011-11-15 ENCOUNTER — Ambulatory Visit (INDEPENDENT_AMBULATORY_CARE_PROVIDER_SITE_OTHER): Payer: Medicare Other | Admitting: Obstetrics & Gynecology

## 2011-11-15 VITALS — BP 144/80 | HR 80 | Temp 97.8°F | Ht 66.5 in | Wt 151.7 lb

## 2011-11-15 DIAGNOSIS — D279 Benign neoplasm of unspecified ovary: Secondary | ICD-10-CM

## 2011-11-15 DIAGNOSIS — D259 Leiomyoma of uterus, unspecified: Secondary | ICD-10-CM

## 2011-11-15 NOTE — Progress Notes (Signed)
Referral for robotic myomectomy- called [facility] and [facility]- not accepting new patients with Medicaid. Called [facility]- will accept faxed referral , review and if accepted will call patient with appt, if not approved will call us and let us know.

## 2011-11-15 NOTE — Progress Notes (Signed)
History:  31 y.o. G0P0 here today for discussion of management of her fibroids seen on imaging in 06/2011.  She reports being in excruciating pain and wants "them out".  She also wants to preserve her fertility and does not want to undergo very invasive surgery. She wants to discuss minimally-invasive surgical management of her fibroids; does not want a hysterectomy as she is very interested in having children.  Patient also has a stable left ovarian dermoid that she wants removed at the same time and a possible small ventral hernia that she is undergoing evaluation for by the [facility].    07/20/2011 TRANSABDOMINAL AND TRANSVAGINAL ULTRASOUND OF PELVIS  Findings: The uterus is enlarged and lobular in contour, measuring 9.3 x 5.6 x 6.3 cm. There are multiple fibroids. The largest is broad-based subserosal at the posterior lower uterine segment, measuring 4.1 cm. There is a 3 cm posterior mid segment myometrial fibroid, a 2.6 cm fundal myometrial fibroid, and a 2.1 cm anterior fundal myometrial fibroid, as well. The endometrial stripe is homogeneous and within normal limits, measuring 10 mm in width.  The right ovary has a normal appearance and measures 4.3 x 3.9 x 3.4 cm. The left ovary measures 3.4 x 2.5 x 2.9 cm. There is a 2 cm hyperechoic area adjacent to the left ovary which is suspicious for a dermoid tumor and not significantly changed compared with the CT scan dated July 09, 2009. There is no free pelvic fluid. IMPRESSION: Fibroid uterus. The fibroids have increased in size and number compared with the prior study. The largest fibroid  today measures 4.1 cm and the largest on the previous study measured 1.7 cm. Stable left ovarian dermoid tumor.  The following portions of the patient's history were reviewed and updated as appropriate: allergies, current medications, past family history, past medical history, past social history, past surgical history and problem list. Of note,  patient has an extensive mental health disorder history requiring stays at [facility].  Patient Active Problem List  Diagnoses  . FIBROIDS, UTERUS  . SICKLE CELL TRAIT  . BIPOLAR DISORDER UNSPECIFIED  . CHRONIC PAIN SYNDROME  . ABDOMINAL WALL HERNIA  . ACUTE CYSTITIS  . ARTHRALGIA  . NECK PAIN  . SCOLIOSIS , IDIOPATHIC  . INSOMNIA  . TREMOR  . ABDOMINAL PAIN, GENERALIZED  . HYPERGLYCEMIA  . DEPRESSION  . Dermoid cyst of left ovary   Review of Systems:  Pertinent items are noted in HPI.  Objective:  Physical Exam Blood pressure 144/80, pulse 80, temperature 97.8 F (36.6 C), temperature source Oral, height 5' 6.5" (1.689 m), weight 151 lb 11.2 oz (68.811 kg), last menstrual period 10/03/2011. Gen: NAD Abd: Soft, nondistended, diffuse lower abdominal tenderness, voluntary guarding noted, no rebound Pelvic: Normal appearing external genitalia; normal discharge.  Unable to do bimanual exam effectively secondary to patient's voluntary guarding and discomfort.   Assessment & Plan:  Will obtain pelvic ultrasound to evaluate for interval change.  She declines any pain medication as they "make me sick". Will refer patient to practices that can offer laparoscopic or robotic-assisted myomectomy as this surgery is not currently offered by our service.  She declined abdominal myomectomy.    She was told to come back/call for any further gynecologic concerns.

## 2011-11-15 NOTE — Patient Instructions (Signed)
Myomectomy Myoma is a non-cancerous tumor made up of fibrous tissue. It is also called leiomyoma, but more often called a fibroid tumor. Myomectomy is the removal of a fibroid tumor without removing another organ, like the uterus or ovary, with it. Fibroids range from the size of a pea to a grapefruit. They are rarely cancerous. Myomas only need treatment when they are growing or when they cause symptoms, such aspain, pressure, bleeding, and pain with intercourse. LET YOUR CAREGIVER KNOW ABOUT:  Any allergies, especially to medicines.   If you develop a cold or an infection before your surgery.   Medicines taken, including vitamins, herbs, eyedrops, over-ther-counter medicines, and creams.   Use of steroids (by mouth or creams).   Previous problems with numbing medicines.   History of blood clots or other bleeding problems.   Other health problems, such as diabetes, kidney, heart, or lung problems.   Previous surgery.   Possibility of pregnancy, if this applies.  RISKS AND COMPLICATIONS   Excessive bleeding.   Infection.   Injury to other organs.   Blood clots in the legs, chest, and brain.   Scar tissue (adhesions) on other organs and in the pelvis.   Death during or after the surgery.  BEFORE THE PROCEDURE  Follow your caregiver's advice regarding your surgery and preparing for surgery.   Avoid taking aspirin or blood thinners as directed by your caregiver.   DO NOT eat or drink anything after midnight on the night before surgery, or as directed by your caregiver.   DO NOT smoke (if you smoke) for 2 weeks before the surgery.   DO NOT drink alcohol the day before the surgery.   If you are admitted the day of the surgery,arrive1 hour before your surgery is scheduled.   Arrange to have someone take you home from the hospital.   Arrange to have someone care for you when you go home.  PROCEDURE There are several ways to perform a myomectomy:  Hysteroscopy  myomectomy. A lighted tube is inserted inside the uterus. The tube will remove the fibroid. This is used when the fibroid is inside the cavity of the uterus.   Laparoscopic myomectomy. A long, lighted tube is inserted through 2 or 3 small incisions to see the organs in the pelvis. The fibroid is removed.   Myomectomy through a sugical cut (incicion) in the abdomen. The fibroid is removed through an incision made in the stomach. This way is performed when thethe fibroid cannot be removed with a hysteroscope or laprascope.  AFTER THE PROCEDURE  If you had laparoscopic or hysteroscopic myomectomy, you may go home the same day or stay overnight.   If you had abdominal myomectomy, you may stay in the hospital a few days.   Your intravenous (IV)access tube and catheter will be removed in 1 or 2 days.   If you stay in the hospital, your caregiver will order pain medicine and a sleeping pill, if needed.   You may be placed on an antibiotic medicine, if needed.   You may be given written instructions and medicines before you are sent home.  Document Released: 04/02/2007 Document Revised: 05/25/2011 Document Reviewed: 04/14/2009 [employer] Patient Information 2012 [employer]. 

## 2011-11-17 ENCOUNTER — Ambulatory Visit (HOSPITAL_COMMUNITY)
Admission: RE | Admit: 2011-11-17 | Discharge: 2011-11-17 | Disposition: A | Discharge status: Home / Self Care | Payer: Medicare Other | Source: Ambulatory Visit | Attending: Obstetrics & Gynecology | Admitting: Obstetrics & Gynecology

## 2011-11-17 DIAGNOSIS — D259 Leiomyoma of uterus, unspecified: Secondary | ICD-10-CM

## 2011-11-17 DIAGNOSIS — N949 Unspecified condition associated with female genital organs and menstrual cycle: Secondary | ICD-10-CM | POA: Insufficient documentation

## 2011-11-17 DIAGNOSIS — D279 Benign neoplasm of unspecified ovary: Secondary | ICD-10-CM | POA: Insufficient documentation

## 2012-03-18 ENCOUNTER — Ambulatory Visit: Payer: Medicare Other | Admitting: Obstetrics & Gynecology

## 2012-04-03 ENCOUNTER — Encounter: Payer: Self-pay | Admitting: Obstetrics & Gynecology

## 2012-04-03 ENCOUNTER — Ambulatory Visit (INDEPENDENT_AMBULATORY_CARE_PROVIDER_SITE_OTHER): Payer: Medicare Other | Admitting: Obstetrics & Gynecology

## 2012-04-03 VITALS — BP 123/79 | HR 82 | Temp 98.4°F | Ht 66.0 in | Wt 152.9 lb

## 2012-04-03 DIAGNOSIS — N949 Unspecified condition associated with female genital organs and menstrual cycle: Secondary | ICD-10-CM

## 2012-04-03 DIAGNOSIS — Z23 Encounter for immunization: Secondary | ICD-10-CM

## 2012-04-03 DIAGNOSIS — R102 Pelvic and perineal pain: Secondary | ICD-10-CM

## 2012-04-03 DIAGNOSIS — IMO0002 Reserved for concepts with insufficient information to code with codable children: Secondary | ICD-10-CM

## 2012-04-03 DIAGNOSIS — D219 Benign neoplasm of connective and other soft tissue, unspecified: Secondary | ICD-10-CM

## 2012-04-03 DIAGNOSIS — D259 Leiomyoma of uterus, unspecified: Secondary | ICD-10-CM

## 2012-04-03 MED ORDER — INFLUENZA VIRUS VACC SPLIT PF IM SUSP
0.5000 mL | Freq: Once | INTRAMUSCULAR | Status: AC
  Administered 2012-04-03: 0.5 mL via INTRAMUSCULAR

## 2012-04-03 MED ORDER — NORETHIN-ETH ESTRAD BIPHASIC 0.5-35/1-35 MG-MCG PO TABS: ORAL_TABLET | ORAL | Status: DC

## 2012-04-03 NOTE — Progress Notes (Signed)
  Subjective:    Patient ID: Kristen [name], female    DOB: 1980-12-06, 31 y.o.   MRN: [id number]  HPI  31 yo S AA G0 who is here today because of CPP for about a decade. She also has dyspareunia for at least 6-8 years (not partner dependent). She has known smallish fibroids. She has had unprotected intercourse for 15 years and no pregnancy. She is in a LTR for about 4 years.  Review of Systems She was given one shot of depo provera in 2/13 to possibly decrease her pain, but she hated the irregular bleeding so she did not get another. She and her partner were treated for trich several months ago and she would like to be retested. She douches regularly, and she is now aware that this is not advised.    Objective:   Physical Exam  Fixed tender uterus, 8 week size, non enlarged and non tender uterus Normal nulliparous cervix with no abnormal discharge noted.      Assessment & Plan:  CPP- probably endometriosis- I will give her a trial of continuous OCPS. If no help, I would offer her a laparoscopy. H/o trich- recheck cervical cultures and wet prep RTC 5 weeks

## 2012-04-03 NOTE — Addendum Note (Signed)
Addended by: Toula Moos on: 04/03/2012 02:57 PM   Modules accepted: Orders

## 2012-04-04 LAB — WET PREP, GENITAL

## 2012-04-04 LAB — GC/CHLAMYDIA PROBE AMP, GENITAL: GC Probe Amp, Genital: NEGATIVE

## 2012-05-08 ENCOUNTER — Ambulatory Visit (INDEPENDENT_AMBULATORY_CARE_PROVIDER_SITE_OTHER): Payer: Medicare Other | Admitting: Obstetrics and Gynecology

## 2012-05-08 ENCOUNTER — Encounter: Payer: Self-pay | Admitting: Obstetrics and Gynecology

## 2012-05-08 ENCOUNTER — Other Ambulatory Visit (HOSPITAL_COMMUNITY)
Admission: RE | Admit: 2012-05-08 | Discharge: 2012-05-08 | Disposition: A | Discharge status: Home / Self Care | Payer: Medicare Other | Source: Ambulatory Visit | Attending: Obstetrics and Gynecology | Admitting: Obstetrics and Gynecology

## 2012-05-08 ENCOUNTER — Ambulatory Visit: Payer: Medicare Other | Admitting: Obstetrics and Gynecology

## 2012-05-08 VITALS — BP 123/75 | HR 79 | Temp 98.3°F | Ht 66.5 in | Wt 156.0 lb

## 2012-05-08 DIAGNOSIS — G8929 Other chronic pain: Secondary | ICD-10-CM | POA: Insufficient documentation

## 2012-05-08 DIAGNOSIS — N760 Acute vaginitis: Secondary | ICD-10-CM | POA: Insufficient documentation

## 2012-05-08 DIAGNOSIS — N949 Unspecified condition associated with female genital organs and menstrual cycle: Secondary | ICD-10-CM

## 2012-05-08 DIAGNOSIS — D259 Leiomyoma of uterus, unspecified: Secondary | ICD-10-CM

## 2012-05-08 NOTE — Progress Notes (Signed)
  Subjective:    Patient ID: Kristen [name], female    DOB: [birth year], 31 y.o.   MRN: [id number]  HPI 31 yo G0 with BMI 24 presenting for follow-up on her chronic pelvic pain. Patient was last seen 5 weeks ago and was started on OCP for medical management of suspected endometriosis. Patient states that she took the pills as indicated and experienced some break through bleeding. She also states that her pain seemed to have improved but she is not sure. Patient was very confused on the reason why this medication was prescribed. Patient also states that her priority right now is trying to conceive. She has had unprotected intercourse for 15 years and has never conceived. Patient desires help in trying to achieve that. She is also having concerns of recent acquisition of trichomonas. She is not having any symptoms but just wants to be checked again. Patient was advised that she tested negative on 10/16 but still request testing. As far as her menstrual history, she has normal monthly menses lasting 5 days which are heavy and has been heavy all her life. She reports generalized abdominal pain on a daily basis. She reports occasional suprapubic pain which radiates to her flanks without any association with her menses.  Past Medical History  Diagnosis Date  . Sickle cell trait   . Fibromyalgia   . Borderline diabetes   . Scoliosis   . Hernia, umbilical   . Carpal tunnel syndrome on both sides   . Arthritis   . Depression   . Anxiety   . Panic attack   . OCD (obsessive compulsive disorder)   . Headache    Past Surgical History  Procedure Date  . Back surgery    History  Substance Use Topics  . Smoking status: Current Every Day Smoker -- 0.5 packs/day  . Smokeless tobacco: Not on file  . Alcohol Use: No   Family History  Problem Relation Age of Onset  . Hypertension Mother   . Diabetes Mother   . Cancer Maternal Grandmother       Review of Systems     Objective:   Physical  Exam GENERAL: Well-developed, well-nourished female in no acute distress.  HEENT: Normocephalic, atraumatic. Sclerae anicteric.  NECK: Supple. Normal thyroid.  LUNGS: Clear to auscultation bilaterally.  HEART: Regular rate and rhythm. BREASTS: Symmetric in size. No palpable masses or lymphadenopathy, skin changes, or nipple drainage. ABDOMEN: Soft, nontender, nondistended. No organomegaly. No pain reproduced on exam PELVIC: Normal external female genitalia. Vagina is pink and rugated.  Normal discharge. Normal appearing cervix. Uterus is normal in size.  No adnexal mass or tenderness. EXTREMITIES: No cyanosis, clubbing, or edema, 2+ distal pulses.     Assessment & Plan:  31 yo G0 with chronic pelvic pain - Discussed diagnostic laparoscopy to rule out endometriosis or try to identify cause of pain - Discussed need for hysterosalpingogram to assess tube integrity as she is with a partner who has children of his own. - patient not interested in diagnostic laparoscopy at this time and cannot afford hysterosalpingogram right now - Discussed the use of over the counter ovulation kits to help in timing of intercourse for conception as patient voiced that this is her priority. Patient desired to try this option and will return in 3 months for further management

## 2012-05-15 ENCOUNTER — Telehealth: Payer: Self-pay | Admitting: General Practice

## 2012-05-15 MED ORDER — METRONIDAZOLE 500 MG PO TABS: 500.0000 mg | ORAL_TABLET | Freq: Two times a day (BID) | ORAL | Status: DC

## 2012-05-15 NOTE — Telephone Encounter (Signed)
Pt informed

## 2012-05-15 NOTE — Telephone Encounter (Signed)
Called patient no answer left message to give [place] a call back for some information

## 2012-05-15 NOTE — Addendum Note (Signed)
Addended by: Catalina Antigua on: 05/15/2012 09:38 AM   Modules accepted: Orders

## 2012-05-15 NOTE — Telephone Encounter (Signed)
Message copied by Kathee Delton on Wed May 15, 2012 10:02 AM ------      Message from: CONSTANT, PEGGY      Created: Wed May 15, 2012  9:38 AM       Please inform patient of positive BV. Flagyl has been e-prescribed            Clinical cytogeneticist

## 2012-05-21 ENCOUNTER — Encounter (HOSPITAL_COMMUNITY): Payer: Self-pay | Admitting: Emergency Medicine

## 2012-05-21 ENCOUNTER — Emergency Department (INDEPENDENT_AMBULATORY_CARE_PROVIDER_SITE_OTHER)
Admission: EM | Admit: 2012-05-21 | Discharge: 2012-05-21 | Disposition: A | Discharge status: Home / Self Care | Payer: Medicare Other | Source: Home / Self Care

## 2012-05-21 DIAGNOSIS — F329 Major depressive disorder, single episode, unspecified: Secondary | ICD-10-CM

## 2012-05-21 DIAGNOSIS — N949 Unspecified condition associated with female genital organs and menstrual cycle: Secondary | ICD-10-CM

## 2012-05-21 DIAGNOSIS — G8929 Other chronic pain: Secondary | ICD-10-CM

## 2012-05-21 DIAGNOSIS — G894 Chronic pain syndrome: Secondary | ICD-10-CM

## 2012-05-21 DIAGNOSIS — F3289 Other specified depressive episodes: Secondary | ICD-10-CM

## 2012-05-21 DIAGNOSIS — R21 Rash and other nonspecific skin eruption: Secondary | ICD-10-CM

## 2012-05-21 DIAGNOSIS — M2550 Pain in unspecified joint: Secondary | ICD-10-CM

## 2012-05-21 DIAGNOSIS — Z760 Encounter for issue of repeat prescription: Secondary | ICD-10-CM

## 2012-05-21 MED ORDER — CYCLOBENZAPRINE HCL 10 MG PO TABS: 10.0000 mg | ORAL_TABLET | Freq: Three times a day (TID) | ORAL | Status: DC | PRN

## 2012-05-21 MED ORDER — MIRTAZAPINE 15 MG PO TABS: 15.0000 mg | ORAL_TABLET | Freq: Every day | ORAL | Status: DC

## 2012-05-21 MED ORDER — PREGABALIN 50 MG PO CAPS: 50.0000 mg | ORAL_CAPSULE | Freq: Two times a day (BID) | ORAL | Status: DC

## 2012-05-21 MED ORDER — TRIAMCINOLONE ACETONIDE 0.025 % EX OINT: TOPICAL_OINTMENT | Freq: Two times a day (BID) | CUTANEOUS | Status: DC

## 2012-05-21 NOTE — ED Notes (Signed)
Medication refills

## 2012-05-21 NOTE — ED Provider Notes (Signed)
History     CSN: [id number]  Arrival date & time 05/21/12  1009   First MD Initiated Contact with Patient 05/21/12 1033      Chief Complaint  Patient presents with  . medication refills   Chronic pain symptoms   HPI 31 y/o female with multiple medical issues listed below here for her medications refill.  Patient still complains of generalized body aches including pain or back of the neck joint pains chronic abdominal and pelvic pains. Patient was recently seen in GYN clinic for her chronic pelvic pain and was started on OCPs and also given a Depo shot. She was planned to be followed up in 6 weeks with a possible laparoscopy for evaluation however patient denied as she could not afford it. Patient also complains off been depressed and smoking more frequently now. She however denies any suicidal ideations. She denies any change in her weight or appetite. She denies any headache, chills, blurry vision, ear pain, dry mouth, sore throat, excessive sweating, chest pain, palpitations, shortness of breath, dyspnea on exertion, bowel or urinary symptoms. She denies any nausea or vomiting. A wet prep that was done recently also showed clue cells and she was treated with a course of Flagyl. Her last visit at health service in September patient was started on Flexeril and leg up for her pain. She was also started on Remeron for her depressive symptoms. patient informs that these medications have been helping her and now she has run out of her prescriptions. Denies any menstrual symptoms at this time.  Past Medical History  Diagnosis Date  . Sickle cell trait   . Fibromyalgia   . Borderline diabetes   . Scoliosis   . Hernia, umbilical   . Carpal tunnel syndrome on both sides   . Arthritis   . Depression   . Anxiety   . Panic attack   . OCD (obsessive compulsive disorder)   . Headache     Past Surgical History  Procedure Date  . Back surgery     Family History  Problem Relation Age of Onset   . Hypertension Mother   . Diabetes Mother   . Cancer Maternal Grandmother     History  Substance Use Topics  . Smoking status: Current Every Day Smoker -- 0.5 packs/day. Smokes marijuana frequently  . Smokeless tobacco: Not on file  . Alcohol Use: No   is not employed currently --on disability  OB History    Grav Para Term Preterm Abortions TAB SAB Ect Mult Living   0               Review of Systems  As outlined in history of present illness. Otherwise 10 point review of systems unremarkable.  Allergies  Tramadol hcl  Home Medications   Current Outpatient Rx  Name  Route  Sig  Dispense  Refill  . CYCLOBENZAPRINE HCL 10 MG PO TABS   Oral   Take 1 tablet (10 mg total) by mouth 3 (three) times daily as needed for muscle spasms. For neck pain.   30 tablet   2   . METRONIDAZOLE 500 MG PO TABS   Oral   Take 1 tablet (500 mg total) by mouth 2 (two) times daily.   14 tablet   0   . MIRTAZAPINE 15 MG PO TABS   Oral   Take 1 tablet (15 mg total) by mouth at bedtime.   30 tablet   2   . NORETHIN-ETH ESTRAD BIPHASIC  0.5-35/1-35 MG-MCG PO TABS      Take ACTIVE pills continuously- don't take the placebos.   1 Package   11   . PREGABALIN 50 MG PO CAPS   Oral   Take 1 capsule (50 mg total) by mouth 2 (two) times daily.   60 capsule   2     BP 119/79  Pulse 68  Temp 98.7 F (37.1 C) (Oral)  Resp 16  SpO2 100%  LMP 04/24/2012  Physical Exam Middle aged female in no acute distress. HEENT: No pallor, no icterus, moist oral mucosa Chest: Clear to auscultation bilaterally no added sounds CVS: Normal S1 and S2 no murmurs Abdomen: Soft tender to palpation diffusely, bowel sounds present, history of umbilical hernia which is difficult to appreciate on exam . Extremities: Warm, no edema, normal range of motion of extremities CNS: AAO x3, normal mood and affect   ED Course  Procedures (including critical care time)  Labs Reviewed - No data to display No  results found.   1. Chronic pelvic pain in female  She has been followed up in GYN clinic and recenrlty started on OCPs for suspected endometriosis. Plan to follow up in 3 months   2. Depression Patient still has chronic depression however denies any suicidal ideation. She has also started smoking more frequently than before. I will refill her Remeron for 2 months and have her followup in the clinic in 2 months.   3. Chronic pain syndrome  Will refill her Flexeril and Lyrica has these are helping with her pain symptoms. Patient has had these symptoms for several years without a clear explanation. Patient will be followed up here in the clinic in 2 months   4. ARTHRALGIA  Symptoms are mild. Continue with current pain medications. Patient does take over-the-counter NSAIDs off and on and can continue to do so.   5. Medication refill   6. Rash, skin  Small macular rashes over bilateral forearms which as per patient it is itchy and not improved with moisturizing lotion. Appears to be related to dry skin. I will prescribe her triamcinolon topical ointment.   7. Tobacco abuse Counseled on smoking cessation. Refuses to seek any further help at this time.       MDM          Eddie North, MD 05/21/12 1115

## 2012-06-18 ENCOUNTER — Emergency Department (HOSPITAL_COMMUNITY)
Admission: EM | Admit: 2012-06-18 | Discharge: 2012-06-18 | Disposition: A | Discharge status: Home / Self Care | Payer: Medicare Other | Attending: Emergency Medicine | Admitting: Emergency Medicine

## 2012-06-18 ENCOUNTER — Emergency Department (HOSPITAL_COMMUNITY): Payer: Medicare Other

## 2012-06-18 ENCOUNTER — Encounter (HOSPITAL_COMMUNITY): Payer: Self-pay

## 2012-06-18 DIAGNOSIS — R110 Nausea: Secondary | ICD-10-CM | POA: Insufficient documentation

## 2012-06-18 DIAGNOSIS — F429 Obsessive-compulsive disorder, unspecified: Secondary | ICD-10-CM | POA: Insufficient documentation

## 2012-06-18 DIAGNOSIS — R071 Chest pain on breathing: Secondary | ICD-10-CM | POA: Insufficient documentation

## 2012-06-18 DIAGNOSIS — R1013 Epigastric pain: Secondary | ICD-10-CM

## 2012-06-18 DIAGNOSIS — Z8739 Personal history of other diseases of the musculoskeletal system and connective tissue: Secondary | ICD-10-CM | POA: Insufficient documentation

## 2012-06-18 DIAGNOSIS — Z79899 Other long term (current) drug therapy: Secondary | ICD-10-CM | POA: Insufficient documentation

## 2012-06-18 DIAGNOSIS — D573 Sickle-cell trait: Secondary | ICD-10-CM | POA: Insufficient documentation

## 2012-06-18 DIAGNOSIS — F411 Generalized anxiety disorder: Secondary | ICD-10-CM | POA: Insufficient documentation

## 2012-06-18 DIAGNOSIS — F329 Major depressive disorder, single episode, unspecified: Secondary | ICD-10-CM | POA: Insufficient documentation

## 2012-06-18 DIAGNOSIS — F17200 Nicotine dependence, unspecified, uncomplicated: Secondary | ICD-10-CM | POA: Insufficient documentation

## 2012-06-18 DIAGNOSIS — R0789 Other chest pain: Secondary | ICD-10-CM

## 2012-06-18 DIAGNOSIS — K3189 Other diseases of stomach and duodenum: Secondary | ICD-10-CM | POA: Insufficient documentation

## 2012-06-18 DIAGNOSIS — E119 Type 2 diabetes mellitus without complications: Secondary | ICD-10-CM | POA: Insufficient documentation

## 2012-06-18 DIAGNOSIS — F410 Panic disorder [episodic paroxysmal anxiety] without agoraphobia: Secondary | ICD-10-CM | POA: Insufficient documentation

## 2012-06-18 DIAGNOSIS — IMO0001 Reserved for inherently not codable concepts without codable children: Secondary | ICD-10-CM | POA: Insufficient documentation

## 2012-06-18 DIAGNOSIS — M4120 Other idiopathic scoliosis, site unspecified: Secondary | ICD-10-CM | POA: Insufficient documentation

## 2012-06-18 DIAGNOSIS — F3289 Other specified depressive episodes: Secondary | ICD-10-CM | POA: Insufficient documentation

## 2012-06-18 MED ORDER — GI COCKTAIL ~~LOC~~
30.0000 mL | Freq: Once | ORAL | Status: AC
  Administered 2012-06-18: 30 mL via ORAL
  Filled 2012-06-18: qty 30

## 2012-06-18 MED ORDER — ACETAMINOPHEN 325 MG PO TABS
650.0000 mg | ORAL_TABLET | Freq: Once | ORAL | Status: AC
  Administered 2012-06-18: 650 mg via ORAL
  Filled 2012-06-18: qty 2

## 2012-06-18 MED ORDER — OMEPRAZOLE 20 MG PO CPDR: 20.0000 mg | DELAYED_RELEASE_CAPSULE | Freq: Every day | ORAL | Status: DC

## 2012-06-18 NOTE — ED Notes (Signed)
Patient discharged using teach bach method she verbalizes an understanding

## 2012-06-18 NOTE — ED Provider Notes (Signed)
History     CSN: [id number]  Arrival date & time 06/18/12  0504   First MD Initiated Contact with Patient 06/18/12 [phone]      Chief Complaint  Patient presents with  . Chest Pain    (Consider location/radiation/quality/duration/timing/severity/associated sxs/prior treatment) Patient is a 31 y.o. female presenting with chest pain. The history is provided by the patient.  Chest Pain The chest pain began yesterday. Primary symptoms include nausea. Pertinent negatives for primary symptoms include no fever and no cough. Associated symptoms comments: Pain in left chest below breast similar to previous, recurrent pain. She states that in the past she has taken an antiacid with relief. She did not take anything prior to arrival other than ibuprofen. No fever, cough. She has mild shortness of breath secondary to pain. .     Past Medical History  Diagnosis Date  . Sickle cell trait   . Fibromyalgia   . Borderline diabetes   . Scoliosis   . Hernia, umbilical   . Carpal tunnel syndrome on both sides   . Arthritis   . Depression   . Anxiety   . Panic attack   . OCD (obsessive compulsive disorder)   . Headache     Past Surgical History  Procedure Date  . Back surgery     Family History  Problem Relation Age of Onset  . Hypertension Mother   . Diabetes Mother   . Cancer Maternal Grandmother     History  Substance Use Topics  . Smoking status: Current Every Day Smoker -- 0.5 packs/day  . Smokeless tobacco: Not on file  . Alcohol Use: No    OB History    Grav Para Term Preterm Abortions TAB SAB Ect Mult Living   0               Review of Systems  Constitutional: Negative for fever and chills.  Respiratory: Negative.  Negative for cough.   Cardiovascular: Positive for chest pain.  Gastrointestinal: Positive for nausea.  Musculoskeletal: Negative.   Skin: Negative.   Neurological: Negative.     Allergies  Tramadol hcl  Home Medications   Current Outpatient Rx   Name  Route  Sig  Dispense  Refill  . CYCLOBENZAPRINE HCL 10 MG PO TABS   Oral   Take 1 tablet (10 mg total) by mouth 3 (three) times daily as needed for muscle spasms. For neck pain.   30 tablet   2   . METRONIDAZOLE 500 MG PO TABS   Oral   Take 1 tablet (500 mg total) by mouth 2 (two) times daily.   14 tablet   0   . MIRTAZAPINE 15 MG PO TABS   Oral   Take 1 tablet (15 mg total) by mouth at bedtime.   30 tablet   2   . NECON 0.5/35 (28) 0.5-35 MG-MCG PO TABS   Oral   Take 1 tablet by mouth Daily.         Kathrynn Running ESTRAD BIPHASIC 0.5-35/1-35 MG-MCG PO TABS      Take ACTIVE pills continuously- don't take the placebos.   1 Package   11   . PREGABALIN 50 MG PO CAPS   Oral   Take 1 capsule (50 mg total) by mouth 2 (two) times daily.   60 capsule   2   . TRIAMCINOLONE ACETONIDE 0.025 % EX OINT   Topical   Apply topically 2 (two) times daily.   30 g  0     BP 120/73  Pulse 71  Temp 98.4 F (36.9 C) (Oral)  Resp 16  SpO2 100%  LMP 05/10/2012  Physical Exam  Constitutional: She is oriented to person, place, and time. She appears well-developed and well-nourished.  HENT:  Head: Normocephalic.  Neck: Normal range of motion. Neck supple.  Cardiovascular: Normal rate and regular rhythm.   Pulmonary/Chest: Effort normal and breath sounds normal. She has no wheezes. She has no rales. She exhibits tenderness.  Abdominal: Soft. Bowel sounds are normal. There is no tenderness. There is no rebound and no guarding.  Musculoskeletal: Normal range of motion. She exhibits no edema.  Neurological: She is alert and oriented to person, place, and time.  Skin: Skin is warm and dry. No rash noted.  Psychiatric: She has a normal mood and affect.    ED Course  Procedures (including critical care time)  Labs Reviewed - No data to display Dg Chest 2 View  06/18/2012  *RADIOLOGY REPORT*  Clinical Data: Chest pain; history of smoking and diabetes.  CHEST - 2 VIEW   Comparison: Chest radiograph performed 01/27/2010  Findings: The lungs are well-aerated.  Pulmonary vascularity is at the upper limits of normal.  There is no evidence of focal opacification, pleural effusion or pneumothorax.  The heart is normal in size; the mediastinal contour is within normal limits.  No acute osseous abnormalities are seen. Thoracolumbar spinal fusion hardware is noted.  IMPRESSION: No acute cardiopulmonary process seen.   Original Report Authenticated By: Tonia Ghent, M.D.      No diagnosis found. 1. Chest wall pain 2. Dyspepsia    MDM  Pain is relieved with GI cocktail. Normal EKG and Cxr evaluating recurrent symptoms. Doubt ACS.        Arnoldo Hooker, PA-C 06/18/12 [phone]

## 2012-06-18 NOTE — ED Provider Notes (Signed)
Medical screening examination/treatment/procedure(s) were performed by non-physician practitioner and as supervising physician I was immediately available for consultation/collaboration.  Sunnie Nielsen, MD 06/18/12 [phone]

## 2012-06-18 NOTE — ED Notes (Signed)
Pt reports chest pain since Thursday in left chest, squeezing pressure. Reports N/V, SOB, and HA that started tonight. States it woke her up from her sleep. ETOH.

## 2012-06-18 NOTE — ED Notes (Signed)
Pt. Ambulated to bathroom with no problem. Currently resting in bed.

## 2012-07-31 ENCOUNTER — Encounter: Payer: Self-pay | Admitting: Obstetrics and Gynecology

## 2012-07-31 ENCOUNTER — Ambulatory Visit (INDEPENDENT_AMBULATORY_CARE_PROVIDER_SITE_OTHER): Payer: Medicare Other | Admitting: Obstetrics and Gynecology

## 2012-07-31 VITALS — BP 134/90 | HR 72 | Temp 97.0°F | Resp 20 | Ht 66.5 in | Wt 151.6 lb

## 2012-07-31 DIAGNOSIS — F3289 Other specified depressive episodes: Secondary | ICD-10-CM

## 2012-07-31 DIAGNOSIS — G894 Chronic pain syndrome: Secondary | ICD-10-CM

## 2012-07-31 DIAGNOSIS — F329 Major depressive disorder, single episode, unspecified: Secondary | ICD-10-CM

## 2012-07-31 NOTE — Progress Notes (Signed)
Patient ID: Kristen [name], female   DOB: 1980-12-15, 32 y.o.   MRN: [id number] 32 yo G0P0 with chronic pain syndrome presenting today for follow-up. Patient reports pelvic pain well controlled on continuous OCP except for this past month when she had a 7 day-period. Patient continues to have generalized pain. Patient admits to being depressed. She denies suicidal/homicidal ideation. Patient is depressed because she feels the pain medications are making her so groggy and sleepy that she can't function. Patient often goes without taking her pain medication for that reason. Patient still desires to become pregnant. At the beginning of the visit, patient stated that pregnancy is not something she can think about right now because she is in so much pain. Which is why she opted to continue on OCP for the past 3 months. By the end of the visit, patient stated that she truly desires to become pregnant and desires help conceiving.  Recommended to patient a referral to pain clinic to optimize the management of her chronic pain syndrome. Patient quickly declined.  Recommended better management of her depression prior to seeking pregnancy as pregnancy can add emotional and physical stress. Patient also quickly denied.  Patient stated that her pain is not bad all the time and she can function the majority of the time. Advised patient to take prenatal vitamins. Reviewed timing of intercourse around the time of ovulation.   RTC in 6 months if no pregnancy for HSG, as previously discussed. Patient left the office stating that she may continue to take OCP for a little while before trying to conceive

## 2012-11-27 ENCOUNTER — Telehealth: Payer: Self-pay | Admitting: *Deleted

## 2012-11-27 ENCOUNTER — Other Ambulatory Visit: Payer: Self-pay | Admitting: Obstetrics & Gynecology

## 2012-11-27 DIAGNOSIS — IMO0001 Reserved for inherently not codable concepts without codable children: Secondary | ICD-10-CM

## 2012-11-27 NOTE — Telephone Encounter (Signed)
Patient left a message stating that she needs a refill on her birth control pills and her ibuprofen. She has an upcoming appt in July and wants to make sure that she doesn't run out.

## 2012-11-28 MED ORDER — NORETHIN-ETH ESTRAD BIPHASIC 0.5-35/1-35 MG-MCG PO TABS: ORAL_TABLET | ORAL | Status: DC

## 2012-11-28 NOTE — Telephone Encounter (Signed)
Called patient and informed her that we did refill her ibuprofen and her OCPs for 3 months which will definitely last till her July appt. Patient verbalized understanding and had no further questions

## 2012-12-23 ENCOUNTER — Ambulatory Visit: Payer: Medicare Other | Admitting: Family Medicine

## 2013-01-01 ENCOUNTER — Ambulatory Visit: Payer: Medicare Other | Admitting: Obstetrics & Gynecology

## 2013-02-04 ENCOUNTER — Other Ambulatory Visit: Payer: Self-pay | Admitting: Family Medicine

## 2013-02-15 ENCOUNTER — Emergency Department (HOSPITAL_COMMUNITY)
Admission: EM | Admit: 2013-02-15 | Discharge: 2013-02-15 | Disposition: A | Discharge status: Home / Self Care | Payer: Medicare Other | Attending: Emergency Medicine | Admitting: Emergency Medicine

## 2013-02-15 ENCOUNTER — Encounter (HOSPITAL_COMMUNITY): Payer: Self-pay | Admitting: *Deleted

## 2013-02-15 ENCOUNTER — Emergency Department (HOSPITAL_COMMUNITY): Payer: Medicare Other

## 2013-02-15 DIAGNOSIS — F429 Obsessive-compulsive disorder, unspecified: Secondary | ICD-10-CM | POA: Insufficient documentation

## 2013-02-15 DIAGNOSIS — F3289 Other specified depressive episodes: Secondary | ICD-10-CM | POA: Insufficient documentation

## 2013-02-15 DIAGNOSIS — F411 Generalized anxiety disorder: Secondary | ICD-10-CM | POA: Insufficient documentation

## 2013-02-15 DIAGNOSIS — F329 Major depressive disorder, single episode, unspecified: Secondary | ICD-10-CM | POA: Insufficient documentation

## 2013-02-15 DIAGNOSIS — M4120 Other idiopathic scoliosis, site unspecified: Secondary | ICD-10-CM | POA: Insufficient documentation

## 2013-02-15 DIAGNOSIS — M129 Arthropathy, unspecified: Secondary | ICD-10-CM | POA: Insufficient documentation

## 2013-02-15 DIAGNOSIS — F17200 Nicotine dependence, unspecified, uncomplicated: Secondary | ICD-10-CM | POA: Insufficient documentation

## 2013-02-15 DIAGNOSIS — Z23 Encounter for immunization: Secondary | ICD-10-CM | POA: Insufficient documentation

## 2013-02-15 DIAGNOSIS — Z3202 Encounter for pregnancy test, result negative: Secondary | ICD-10-CM | POA: Insufficient documentation

## 2013-02-15 DIAGNOSIS — D573 Sickle-cell trait: Secondary | ICD-10-CM | POA: Insufficient documentation

## 2013-02-15 DIAGNOSIS — S0181XA Laceration without foreign body of other part of head, initial encounter: Secondary | ICD-10-CM

## 2013-02-15 DIAGNOSIS — F410 Panic disorder [episodic paroxysmal anxiety] without agoraphobia: Secondary | ICD-10-CM | POA: Insufficient documentation

## 2013-02-15 DIAGNOSIS — S01409A Unspecified open wound of unspecified cheek and temporomandibular area, initial encounter: Secondary | ICD-10-CM | POA: Insufficient documentation

## 2013-02-15 DIAGNOSIS — Z79899 Other long term (current) drug therapy: Secondary | ICD-10-CM | POA: Insufficient documentation

## 2013-02-15 LAB — POCT PREGNANCY, URINE: Preg Test, Ur: NEGATIVE

## 2013-02-15 MED ORDER — TETANUS-DIPHTH-ACELL PERTUSSIS 5-2.5-18.5 LF-MCG/0.5 IM SUSP
0.5000 mL | Freq: Once | INTRAMUSCULAR | Status: AC
  Administered 2013-02-15: 0.5 mL via INTRAMUSCULAR
  Filled 2013-02-15: qty 0.5

## 2013-02-15 MED ORDER — KETOROLAC TROMETHAMINE 60 MG/2ML IM SOLN
60.0000 mg | Freq: Once | INTRAMUSCULAR | Status: AC
  Administered 2013-02-15: 60 mg via INTRAMUSCULAR
  Filled 2013-02-15: qty 2

## 2013-02-15 MED ORDER — IBUPROFEN 600 MG PO TABS: 600.0000 mg | ORAL_TABLET | Freq: Four times a day (QID) | ORAL | Status: DC | PRN
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
## 2013-02-15 NOTE — ED Notes (Signed)
Pt seen by EDP upon arrival, orders received and initiated. Pt ambulatory to b/r and back to stretcher. Pt to CT via stretcher.
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
## 2013-02-20 ENCOUNTER — Encounter (HOSPITAL_COMMUNITY): Payer: Self-pay | Admitting: *Deleted

## 2013-02-20 ENCOUNTER — Inpatient Hospital Stay (HOSPITAL_COMMUNITY)
Admission: AD | Admit: 2013-02-20 | Discharge: 2013-02-20 | Disposition: A | Discharge status: Home / Self Care | Payer: Medicare Other | Source: Ambulatory Visit | Attending: Obstetrics & Gynecology | Admitting: Obstetrics & Gynecology

## 2013-02-20 DIAGNOSIS — G44309 Post-traumatic headache, unspecified, not intractable: Secondary | ICD-10-CM

## 2013-02-20 DIAGNOSIS — F319 Bipolar disorder, unspecified: Secondary | ICD-10-CM | POA: Insufficient documentation

## 2013-02-20 DIAGNOSIS — Y92009 Unspecified place in unspecified non-institutional (private) residence as the place of occurrence of the external cause: Secondary | ICD-10-CM | POA: Insufficient documentation

## 2013-02-20 DIAGNOSIS — R51 Headache: Secondary | ICD-10-CM | POA: Insufficient documentation

## 2013-02-20 DIAGNOSIS — M549 Dorsalgia, unspecified: Secondary | ICD-10-CM | POA: Insufficient documentation

## 2013-02-20 DIAGNOSIS — T148XXA Other injury of unspecified body region, initial encounter: Secondary | ICD-10-CM

## 2013-02-20 DIAGNOSIS — M2550 Pain in unspecified joint: Secondary | ICD-10-CM

## 2013-02-20 DIAGNOSIS — G894 Chronic pain syndrome: Secondary | ICD-10-CM | POA: Insufficient documentation

## 2013-02-20 HISTORY — DX: Leiomyoma of uterus, unspecified: D25.9

## 2013-02-20 MED ORDER — IBUPROFEN 600 MG PO TABS: 600.0000 mg | ORAL_TABLET | Freq: Four times a day (QID) | ORAL | Status: DC | PRN

## 2013-02-20 MED ORDER — KETOROLAC TROMETHAMINE 30 MG/ML IJ SOLN
30.0000 mg | Freq: Once | INTRAMUSCULAR | Status: AC
  Administered 2013-02-20: 30 mg via INTRAMUSCULAR
  Filled 2013-02-20: qty 1
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
## 2013-04-10 ENCOUNTER — Other Ambulatory Visit: Payer: Self-pay | Admitting: Family Medicine

## 2013-05-01 ENCOUNTER — Encounter (HOSPITAL_COMMUNITY): Payer: Self-pay | Admitting: *Deleted

## 2013-05-01 ENCOUNTER — Inpatient Hospital Stay (HOSPITAL_COMMUNITY)
Admission: AD | Admit: 2013-05-01 | Discharge: 2013-05-01 | Disposition: A | Discharge status: Home / Self Care | Payer: Medicare Other | Source: Ambulatory Visit | Attending: Obstetrics & Gynecology | Admitting: Obstetrics & Gynecology

## 2013-05-01 DIAGNOSIS — A5901 Trichomonal vulvovaginitis: Secondary | ICD-10-CM

## 2013-05-01 DIAGNOSIS — R109 Unspecified abdominal pain: Secondary | ICD-10-CM | POA: Insufficient documentation

## 2013-05-01 DIAGNOSIS — R197 Diarrhea, unspecified: Secondary | ICD-10-CM | POA: Insufficient documentation

## 2013-05-01 DIAGNOSIS — Z202 Contact with and (suspected) exposure to infections with a predominantly sexual mode of transmission: Secondary | ICD-10-CM | POA: Insufficient documentation

## 2013-05-01 LAB — WET PREP, GENITAL: Yeast Wet Prep HPF POC: NONE SEEN

## 2013-05-01 LAB — CBC
HCT: 33.3 % — ABNORMAL LOW (ref 36.0–46.0)
Hemoglobin: 12 g/dL (ref 12.0–15.0)
MCH: 30.6 pg (ref 26.0–34.0)
MCHC: 36 g/dL (ref 30.0–36.0)
MCV: 84.9 fL (ref 78.0–100.0)

## 2013-05-01 LAB — URINE MICROSCOPIC-ADD ON

## 2013-05-01 LAB — URINALYSIS, ROUTINE W REFLEX MICROSCOPIC
Bilirubin Urine: NEGATIVE
Glucose, UA: NEGATIVE mg/dL
Ketones, ur: NEGATIVE mg/dL
Protein, ur: NEGATIVE mg/dL
pH: 6 (ref 5.0–8.0)

## 2013-05-01 MED ORDER — ONDANSETRON HCL 4 MG PO TABS
4.0000 mg | ORAL_TABLET | Freq: Once | ORAL | Status: AC
  Administered 2013-05-01: 4 mg via ORAL
  Filled 2013-05-01: qty 1

## 2013-05-01 MED ORDER — METRONIDAZOLE 500 MG PO TABS
2000.0000 mg | ORAL_TABLET | Freq: Once | ORAL | Status: AC
  Administered 2013-05-01: 2000 mg via ORAL
  Filled 2013-05-01: qty 4

## 2013-05-01 NOTE — MAU Note (Signed)
Patient states she has a new partner and is concerned she might have an STI. States she has had abdominal cramping for a few weeks. Denies bleeding but does have a cream color with an odor. Diarrhea x 4-5 today, nausea for the past few days, no vomiting.
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
## 2013-05-01 NOTE — MAU Provider Note (Deleted)
Chief Complaint: Abdominal Pain  First Provider Initiated Contact with Patient 05/01/13 1645     SUBJECTIVE HPI: Kristen [name] is a 32 y.o. G0P0 female who presents with concern for STD, increased, malodorous vaginal discharge w/ irritation, nausea and loose stools x 4 days. Feels like when she has had Trichomonas in the past. New partner.      Past Medical History  Diagnosis Date  . Sickle cell trait   . Borderline diabetes   . Scoliosis   . Hernia, umbilical   . Arthritis   . Depression   . Anxiety   . Panic attack   . OCD (obsessive compulsive disorder)   . Headache(784.0)   . Fibromyalgia   . Carpal tunnel syndrome on both sides   . Fibroid, uterine    OB History  Gravida Para Term Preterm AB SAB TAB Ectopic Multiple Living  0                Past Surgical History  Procedure Laterality Date  . Back surgery    . Wisdom tooth extraction     History   Social History  . Marital Status: Single    Spouse Name: N/A    Number of Children: N/A  . Years of Education: N/A   Occupational History  . Not on file.   Social History Main Topics  . Smoking status: Current Every Day Smoker -- 0.50 packs/day for 14 years    Types: Cigarettes  . Smokeless tobacco: Not on file  . Alcohol Use: Yes  . Drug Use: Yes    Special: Marijuana  . Sexual Activity: Yes    Birth Control/ Protection: Pill   Other Topics Concern  . Not on file   Social History Narrative  . No narrative on file   No current facility-administered medications on file prior to encounter.   Current Outpatient Prescriptions on File Prior to Encounter  Medication Sig Dispense Refill  . ibuprofen (ADVIL,MOTRIN) 600 MG tablet Take 1 tablet (600 mg total) by mouth every 6 (six) hours as needed for pain.  30 tablet  1  . Norethindrone-Ethinyl Estradiol Biphasic (NECON 10/11, 28,) 0.5-35/1-35 MG-MCG tablet TAKE ACTIVE PILLS CONTINUOUSLY- DON'T TAKE THE PLACEBOS.  28 tablet  2   Allergies  Allergen  Reactions  . Tramadol Hcl Hives and Itching    ROS: Pertinent items in HPI  OBJECTIVE Blood pressure 126/69, pulse 67, temperature 98.1 F (36.7 C), temperature source Oral, resp. rate 18, height 5\' 7"  (1.702 m), weight 66.225 kg (146 lb), SpO2 100.00%. GENERAL: Well-developed, well-nourished female in no acute distress.  HEENT: Normocephalic HEART: normal rate RESP: normal effort ABDOMEN: Soft, mild tenderness at site of hernia. (Normal per pt). Mild, reducible buldge w/ valsalva.  EXTREMITIES: Nontender, no edema NEURO: Alert and oriented SPECULUM EXAM: NEFG, large amount of creamy, white, malodorous discharge, no blood noted, cervix clean BIMANUAL: cervix closed; uterus normal size, no adnexal tenderness or masses. No CMT.   LAB RESULTS Results for orders placed during the hospital encounter of 05/01/13 (from the past 24 hour(s))  URINALYSIS, ROUTINE W REFLEX MICROSCOPIC     Status: Abnormal   Collection Time    05/01/13  3:20 PM      Result Value Range   Color, Urine YELLOW  YELLOW   APPearance CLEAR  CLEAR   Specific Gravity, Urine 1.015  1.005 - 1.030   pH 6.0  5.0 - 8.0   Glucose, UA NEGATIVE  NEGATIVE mg/dL   Hgb  urine dipstick NEGATIVE  NEGATIVE   Bilirubin Urine NEGATIVE  NEGATIVE   Ketones, ur NEGATIVE  NEGATIVE mg/dL   Protein, ur NEGATIVE  NEGATIVE mg/dL   Urobilinogen, UA 0.2  0.0 - 1.0 mg/dL   Nitrite NEGATIVE  NEGATIVE   Leukocytes, UA TRACE (*) NEGATIVE  URINE MICROSCOPIC-ADD ON     Status: None   Collection Time    05/01/13  3:20 PM      Result Value Range   Squamous Epithelial / LPF RARE  RARE   WBC, UA 3-6  <3 WBC/hpf   Bacteria, UA RARE  RARE   Urine-Other TRICHOMONAS PRESENT    POCT PREGNANCY, URINE     Status: None   Collection Time    05/01/13  3:34 PM      Result Value Range   Preg Test, Ur NEGATIVE  NEGATIVE  WET PREP, GENITAL     Status: Abnormal   Collection Time    05/01/13  4:45 PM      Result Value Range   Yeast Wet Prep HPF POC  NONE SEEN  NONE SEEN   Trich, Wet Prep MODERATE (*) NONE SEEN   Clue Cells Wet Prep HPF POC MODERATE (*) NONE SEEN   WBC, Wet Prep HPF POC FEW (*) NONE SEEN  CBC     Status: Abnormal   Collection Time    05/01/13  5:00 PM      Result Value Range   WBC 9.6  4.0 - 10.5 K/uL   RBC 3.92  3.87 - 5.11 MIL/uL   Hemoglobin 12.0  12.0 - 15.0 g/dL   HCT 47.8 (*) 29.5 - 62.1 %   MCV 84.9  78.0 - 100.0 fL   MCH 30.6  26.0 - 34.0 pg   MCHC 36.0  30.0 - 36.0 g/dL   RDW 30.8  65.7 - 84.6 %   Platelets 313  150 - 400 K/uL    IMAGING No results found.  MAU COURSE Flagyl given.  ASSESSMENT 1. Trichomonas vaginitis    PLAN Discharge home in stable condition.  No sex x 1 week after partner Tx.  GC/CT pending.      Follow-up Information   Follow up with [facility] [place]. (As needed for full STD testing)    Contact information:   [address] [place] [address] [phone]      Follow up with [facility]. (As needed for Ob//Gyn emergencies.)    Contact information:   [address] [id number] [place] [address] [phone]       Medication List         albuterol 108 (90 BASE) MCG/ACT inhaler  Commonly known as:  PROVENTIL HFA;VENTOLIN HFA  Inhale 1 puff into the lungs every 6 (six) hours as needed for wheezing or shortness of breath.     ibuprofen 600 MG tablet  Commonly known as:  ADVIL,MOTRIN  Take 1 tablet (600 mg total) by mouth every 6 (six) hours as needed for pain.     Norethindrone-Ethinyl Estradiol Biphasic 0.5-35/1-35 MG-MCG tablet  Commonly known as:  NECON 10/11 (28)  TAKE ACTIVE PILLS CONTINUOUSLY- DON'T TAKE THE PLACEBOS.       [place], CNM 05/01/2013  5:42 PM

## 2013-05-08 ENCOUNTER — Inpatient Hospital Stay (HOSPITAL_COMMUNITY)
Admission: AD | Admit: 2013-05-08 | Discharge: 2013-05-08 | Disposition: A | Discharge status: Home / Self Care | Payer: Medicare Other | Source: Ambulatory Visit | Attending: Obstetrics & Gynecology | Admitting: Obstetrics & Gynecology

## 2013-05-08 DIAGNOSIS — R109 Unspecified abdominal pain: Secondary | ICD-10-CM | POA: Insufficient documentation

## 2013-05-08 DIAGNOSIS — A499 Bacterial infection, unspecified: Secondary | ICD-10-CM | POA: Insufficient documentation

## 2013-05-08 DIAGNOSIS — A5901 Trichomonal vulvovaginitis: Secondary | ICD-10-CM | POA: Insufficient documentation

## 2013-05-08 DIAGNOSIS — N760 Acute vaginitis: Secondary | ICD-10-CM | POA: Insufficient documentation

## 2013-05-08 DIAGNOSIS — B9689 Other specified bacterial agents as the cause of diseases classified elsewhere: Secondary | ICD-10-CM | POA: Insufficient documentation

## 2013-05-08 MED ORDER — METRONIDAZOLE 500 MG PO TABS: 500.0000 mg | ORAL_TABLET | Freq: Two times a day (BID) | ORAL | Status: DC

## 2013-05-08 NOTE — ED Notes (Signed)
J.ether,PA discused results and gave pt Rx for BV she was dx with last week.

## 2013-05-08 NOTE — MAU Provider Note (Signed)
History     CSN: [id number]  Arrival date and time: 05/08/13 1014   None     Chief Complaint  Patient presents with  . Exposure to STD   HPI [name] is a 32 y.o. G0P0 who presents to MAU today with complaint of lower abdominal pain. The patient was seen in MAU on 05/01/13 with the same complaint. At that time she was also having discharge. She states that the discharge has improved, but not resolved and she continues to have lower abdominal pain. At her previous visit she was given 2G Flagyl in MAU to treat Trichomonas. It was also noted that she had moderate clue cells on her wet prep, but no Rx was sent. Patient denies fever today. Patient is concerned that the medication did not work because when she left here last time she had 1 shot of alcohol and 2 beers. The patient denies vomiting that night.   OB History   Grav Para Term Preterm Abortions TAB SAB Ect Mult Living   0               Past Medical History  Diagnosis Date  . Sickle cell trait   . Borderline diabetes   . Scoliosis   . Hernia, umbilical   . Arthritis   . Depression   . Anxiety   . Panic attack   . OCD (obsessive compulsive disorder)   . Headache(784.0)   . Fibromyalgia   . Carpal tunnel syndrome on both sides   . Fibroid, uterine     Past Surgical History  Procedure Laterality Date  . Back surgery    . Wisdom tooth extraction      Family History  Problem Relation Age of Onset  . Hypertension Mother   . Diabetes Mother   . Fibromyalgia Mother   . Stroke Mother   . Cancer Maternal Grandmother     History  Substance Use Topics  . Smoking status: Current Every Day Smoker -- 0.50 packs/day for 14 years    Types: Cigarettes  . Smokeless tobacco: Not on file  . Alcohol Use: Yes     Comment: 3 beers and 1 shot of liquor tonight 02/15/13    Allergies:  Allergies  Allergen Reactions  . Tramadol Hcl Hives and Itching    Prescriptions prior to admission  Medication Sig  Dispense Refill  . albuterol (PROVENTIL HFA;VENTOLIN HFA) 108 (90 BASE) MCG/ACT inhaler Inhale 1 puff into the lungs every 6 (six) hours as needed for wheezing or shortness of breath.      Marland Kitchen ibuprofen (ADVIL,MOTRIN) 600 MG tablet Take 1 tablet (600 mg total) by mouth every 6 (six) hours as needed for pain.  30 tablet  1  . Norethindrone-Ethinyl Estradiol Biphasic (NECON 10/11, 28,) 0.5-35/1-35 MG-MCG tablet TAKE ACTIVE PILLS CONTINUOUSLY- DON'T TAKE THE PLACEBOS.  28 tablet  2    Review of Systems  Constitutional: Negative for fever.  Gastrointestinal: Positive for nausea and abdominal pain. Negative for vomiting, diarrhea and constipation.  Genitourinary:       + vaginal discharge Neg - vaginal bleeding   Physical Exam   There were no vitals taken for this visit.  Physical Exam  Constitutional: She is oriented to person, place, and time. She appears well-developed and well-nourished. No distress.  HENT:  Head: Normocephalic.  Respiratory: Effort normal.  Neurological: She is alert and oriented to person, place, and time.  Skin: Skin is warm and dry. No erythema.  Psychiatric: She  has a normal mood and affect.    MAU Course  Procedures None  MDM Discussed with patient that we would not generally repeat testing at this time.  She requests additional treatment As patient was not given an adequate dose of Flagyl for BV I will send Rx for Flagyl 500 mg BID x 7 days to her pharmacy Pharmacy of choice was confirmed  Assessment and Plan  A: Recent trichomonas Bacterial vaginosis  P: Discharge home Rx for Flagyl sent to patient's pharmacy Patient advised no ETOH while on medication Patient advised to follow-up with [facility] if needed or symptoms persist Patient may return to MAU as needed or if her condition were to change or worsen  Kristen Starr, PA-C  05/08/2013, 11:18 AM
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
## 2013-05-11 NOTE — MAU Provider Note (Signed)
Attestation of Attending Supervision of Advanced Practitioner (CNM/NP): Evaluation and management procedures were performed by the Advanced Practitioner under my supervision and collaboration. I have reviewed the Advanced Practitioner's note and chart, and I agree with the management and plan.  LEGGETT,KELLY H. 6:10 PM

## 2013-06-18 ENCOUNTER — Encounter (HOSPITAL_COMMUNITY): Payer: Self-pay | Admitting: Emergency Medicine

## 2013-06-18 ENCOUNTER — Emergency Department (INDEPENDENT_AMBULATORY_CARE_PROVIDER_SITE_OTHER)
Admission: EM | Admit: 2013-06-18 | Discharge: 2013-06-18 | Disposition: A | Discharge status: Home / Self Care | Payer: Medicare Other | Source: Home / Self Care | Attending: Emergency Medicine | Admitting: Emergency Medicine

## 2013-06-18 DIAGNOSIS — M542 Cervicalgia: Secondary | ICD-10-CM

## 2013-06-18 DIAGNOSIS — F319 Bipolar disorder, unspecified: Secondary | ICD-10-CM

## 2013-06-18 DIAGNOSIS — N760 Acute vaginitis: Secondary | ICD-10-CM

## 2013-06-18 MED ORDER — MIRTAZAPINE 15 MG PO TABS: 15.0000 mg | ORAL_TABLET | Freq: Every day | ORAL | Status: DC

## 2013-06-18 MED ORDER — ONDANSETRON 8 MG PO TBDP: 8.0000 mg | ORAL_TABLET | Freq: Three times a day (TID) | ORAL | Status: DC | PRN

## 2013-06-18 MED ORDER — METRONIDAZOLE 500 MG PO TABS: ORAL_TABLET | ORAL | Status: DC

## 2013-06-18 MED ORDER — CYCLOBENZAPRINE HCL 5 MG PO TABS: 5.0000 mg | ORAL_TABLET | Freq: Three times a day (TID) | ORAL | Status: DC | PRN

## 2013-06-18 MED ORDER — METRONIDAZOLE 500 MG PO TABS: 500.0000 mg | ORAL_TABLET | Freq: Two times a day (BID) | ORAL | Status: DC

## 2013-06-18 NOTE — ED Notes (Signed)
Multiple complaints, out of medicines
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
## 2013-06-18 NOTE — ED Notes (Signed)
Patient requesting a different treatment plan regarding flagyl

## 2013-07-08 ENCOUNTER — Other Ambulatory Visit: Payer: Self-pay | Admitting: Family Medicine

## 2013-07-23 ENCOUNTER — Telehealth: Payer: Self-pay

## 2013-07-23 DIAGNOSIS — Z0000 Encounter for general adult medical examination without abnormal findings: Secondary | ICD-10-CM

## 2013-07-23 NOTE — Telephone Encounter (Signed)
Pt called and stated that she wanted a sooner appt then March cause she is having back pain.   Called pt and pt informed me that she has been "having back pain, foot pain, butt pain" and she needed to be seen.  I advised pt that the appt that she scheduled with Dr.Constant for September 05, 2013 is the soonest appt.  But I wanted to give her appt for PCP so that she can be seen for other pain related issues.  Pt stated that it was fine cause she did not a PCP.  I gave her the # to [facility] and informed her to call and set up an appt.  Pt stated ok with no further questions. Made amb referral to [facility].

## 2013-07-29 ENCOUNTER — Other Ambulatory Visit (HOSPITAL_COMMUNITY): Payer: Self-pay | Admitting: Obstetrics and Gynecology

## 2013-07-30 ENCOUNTER — Other Ambulatory Visit: Payer: Self-pay | Admitting: Family Medicine

## 2013-09-05 ENCOUNTER — Encounter: Payer: Self-pay | Admitting: Obstetrics and Gynecology

## 2013-09-05 ENCOUNTER — Ambulatory Visit (INDEPENDENT_AMBULATORY_CARE_PROVIDER_SITE_OTHER): Payer: Medicare Other | Admitting: Medical

## 2013-09-05 VITALS — BP 123/72 | HR 76 | Temp 98.2°F | Wt 148.2 lb

## 2013-09-05 DIAGNOSIS — R1084 Generalized abdominal pain: Secondary | ICD-10-CM

## 2013-09-05 DIAGNOSIS — D259 Leiomyoma of uterus, unspecified: Secondary | ICD-10-CM

## 2013-09-05 DIAGNOSIS — G894 Chronic pain syndrome: Secondary | ICD-10-CM

## 2013-09-05 NOTE — Progress Notes (Signed)
ROI signed and faxed to [facility]. Referral form for [facility] faxed; pt. Given forms to fill out and instructed to mail to [facility]. INformed pt. She will be called with an appointment. Pelvic US scheduled for 09/11/13 at0830.

## 2013-09-05 NOTE — Progress Notes (Signed)
Pt. States she is here today to address fibroids, pain, lower back pain, headaches and states she was seen at [facility] in January and was told her pap smear was abnormal but because she was coming here she never followed up with them-- thought she would get a pap here. Informed pt. That we will not repeat her pap smear as insurance will only cover one a year but that we can request those records from Urgent Care.

## 2013-09-05 NOTE — Patient Instructions (Signed)
Fibroids Fibroids are lumps (tumors) that can occur any place in a woman's body. These lumps are not cancerous. Fibroids vary in size, weight, and where they grow. HOME CARE  Do not take aspirin.  Write down the number of pads or tampons you use during your period. Tell your doctor. This can help determine the best treatment for you. GET HELP RIGHT AWAY IF:  You have pain in your lower belly (abdomen) that is not helped with medicine.  You have cramps that are not helped with medicine.  You have more bleeding between or during your period.  You feel lightheaded or pass out (faint).  Your lower belly pain gets worse. MAKE SURE YOU:  Understand these instructions.  Will watch your condition.  Will get help right away if you are not doing well or get worse. Document Released: 07/08/2010 Document Revised: 08/28/2011 Document Reviewed: 07/08/2010 ExitCare Patient Information 2014 [employer].  

## 2013-09-05 NOTE — Progress Notes (Signed)
Patient ID: Kristen [name], female   DOB: Aug 31, 1980, 33 y.o.   MRN: [id number]  History:  [name]  is a 33 y.o. G0P0 who presents to clinic today for fibroids. The patient states that she has pelvic pain and back pain associated with fibroids. Last [place] was from 2013 and showed multiple fibroids. Patient also states that she was seen at [facility] urgent care recently and they called her with abnormal pap smear results. Patient is unsure of the abnormality. She also states that she has had difficulty getting a PCP appointment since [facility] closed. She has multiple chronic issues including scoliosis and fibromyalgia for which she takes medications. Patient states that pain has worsened recently, but also endorses sexual intercourse this morning prior to arrival.   The following portions of the patient's history were reviewed and updated as appropriate: allergies, current medications, past family history, past medical history, past social history, past surgical history and problem list.  Review of Systems:  Pertinent items are noted in HPI.  Objective:  Physical Exam BP 123/72  Pulse 76  Temp(Src) 98.2 F (36.8 C) (Oral)  Wt 148 lb 3.2 oz (67.223 kg) GENERAL: Well-developed, well-nourished female in no acute distress.  HEENT: Normocephalic, atraumatic.  LUNGS: Normaleffort HEART: Regular rate ABDOMEN: Soft, nontender, nondistended. No organomegaly.  PELVIC: Normal external female genitalia. Vagina is pink and rugated. Uterus is slightly enlarged. No adnexal mass or tenderness.  EXTREMITIES: No cyanosis, clubbing, or edema  Labs and Imaging [place] scheduled  Assessment & Plan:  Assessment: Fibroid uterus Chronic pelvic pain  Plans: [place] scheduled ROI for Pap smear results sent to [facility] urgent care Patient referred to [place] Patient advised to continue on current medication regimen until seen by PCP Patient to return to [facility] clinic in ~ 2  weeks for results and management  Farris Has, PA-C 09/05/2013 10:46 AM

## 2013-09-08 ENCOUNTER — Encounter: Payer: Self-pay | Admitting: *Deleted

## 2013-09-11 ENCOUNTER — Ambulatory Visit (HOSPITAL_COMMUNITY)
Admission: RE | Admit: 2013-09-11 | Discharge: 2013-09-11 | Disposition: A | Discharge status: Home / Self Care | Payer: Medicare Other | Source: Ambulatory Visit | Attending: Medical | Admitting: Medical

## 2013-09-11 DIAGNOSIS — N949 Unspecified condition associated with female genital organs and menstrual cycle: Secondary | ICD-10-CM | POA: Insufficient documentation

## 2013-09-11 DIAGNOSIS — R109 Unspecified abdominal pain: Secondary | ICD-10-CM | POA: Insufficient documentation

## 2013-09-11 DIAGNOSIS — R1084 Generalized abdominal pain: Secondary | ICD-10-CM

## 2013-09-11 DIAGNOSIS — G894 Chronic pain syndrome: Secondary | ICD-10-CM

## 2013-09-11 DIAGNOSIS — D279 Benign neoplasm of unspecified ovary: Secondary | ICD-10-CM | POA: Insufficient documentation

## 2013-09-11 DIAGNOSIS — D259 Leiomyoma of uterus, unspecified: Secondary | ICD-10-CM | POA: Insufficient documentation

## 2013-09-15 ENCOUNTER — Other Ambulatory Visit: Payer: Self-pay | Admitting: Family Medicine

## 2013-09-18 ENCOUNTER — Ambulatory Visit (INDEPENDENT_AMBULATORY_CARE_PROVIDER_SITE_OTHER): Payer: Medicare Other | Admitting: Family Medicine

## 2013-09-18 ENCOUNTER — Encounter: Payer: Self-pay | Admitting: Family Medicine

## 2013-09-18 VITALS — BP 134/81 | HR 72 | Ht 66.5 in | Wt 148.8 lb

## 2013-09-18 DIAGNOSIS — D259 Leiomyoma of uterus, unspecified: Secondary | ICD-10-CM

## 2013-09-18 DIAGNOSIS — R102 Pelvic and perineal pain: Secondary | ICD-10-CM

## 2013-09-18 DIAGNOSIS — N949 Unspecified condition associated with female genital organs and menstrual cycle: Secondary | ICD-10-CM

## 2013-09-18 MED ORDER — NORETHIN-ETH ESTRAD BIPHASIC 0.5-35/1-35 MG-MCG PO TABS: ORAL_TABLET | ORAL | Status: DC

## 2013-09-18 NOTE — Assessment & Plan Note (Signed)
?   Related to pelvic pain

## 2013-09-18 NOTE — Patient Instructions (Signed)
Pelvic Pain, Female °Female pelvic pain can be caused by many different things and start from a variety of places. Pelvic pain refers to pain that is located in the lower half of the abdomen and between your hips. The pain may occur over a short period of time (acute) or may be reoccurring (chronic). The cause of pelvic pain may be related to disorders affecting the female reproductive organs (gynecologic), but it may also be related to the bladder, kidney stones, an intestinal complication, or muscle or skeletal problems. Getting help right away for pelvic pain is important, especially if there has been severe, sharp, or a sudden onset of unusual pain. It is also important to get help right away because some types of pelvic pain can be life threatening.  °CAUSES  °Below are only some of the causes of pelvic pain. The causes of pelvic pain can be in one of several categories.  °· Gynecologic. °· Pelvic inflammatory disease. °· Sexually transmitted infection. °· Ovarian cyst or a twisted ovarian ligament (ovarian torsion). °· Uterine lining that grows outside the uterus (endometriosis). °· Fibroids, cysts, or tumors. °· Ovulation. °· Pregnancy. °· Pregnancy that occurs outside the uterus (ectopic pregnancy). °· Miscarriage. °· Labor. °· Abruption of the placenta or ruptured uterus. °· Infection. °· Uterine infection (endometritis). °· Bladder infection. °· Diverticulitis. °· Miscarriage related to a uterine infection (septic abortion). °· Bladder. °· Inflammation of the bladder (cystitis). °· Kidney stone(s). °· Gastrointenstinal. °· Constipation. °· Diverticulitis. °· Neurologic. °· Trauma. °· Feeling pelvic pain because of mental or emotional causes (psychosomatic). °· Cancers of the bowel or pelvis. °EVALUATION  °Your caregiver will want to take a careful history of your concerns. This includes recent changes in your health, a careful gynecologic history of your periods (menses), and a sexual history. Obtaining  your family history and medical history is also important. Your caregiver may suggest a pelvic exam. A pelvic exam will help identify the location and severity of the pain. It also helps in the evaluation of which organ system may be involved. In order to identify the cause of the pelvic pain and be properly treated, your caregiver may order tests. These tests may include:  °· A pregnancy test. °· Pelvic ultrasonography. °· An X-ray exam of the abdomen. °· A urinalysis or evaluation of vaginal discharge. °· Blood tests. °HOME CARE INSTRUCTIONS  °· Only take over-the-counter or prescription medicines for pain, discomfort, or fever as directed by your caregiver.   °· Rest as directed by your caregiver.   °· Eat a balanced diet.   °· Drink enough fluids to make your urine clear or pale yellow, or as directed.   °· Avoid sexual intercourse if it causes pain.   °· Apply warm or cold compresses to the lower abdomen depending on which one helps the pain.   °· Avoid stressful situations.   °· Keep a journal of your pelvic pain. Write down when it started, where the pain is located, and if there are things that seem to be associated with the pain, such as food or your menstrual cycle. °· Follow up with your caregiver as directed.   °SEEK MEDICAL CARE IF: °· Your medicine does not help your pain. °· You have abnormal vaginal discharge. °SEEK IMMEDIATE MEDICAL CARE IF:  °· You have heavy bleeding from the vagina.   °· Your pelvic pain increases.   °· You feel lightheaded or faint.   °· You have chills.   °· You have pain with urination or blood in your urine.   °· You have uncontrolled   diarrhea or vomiting.   °· You have a fever or persistent symptoms for more than 3 days. °· You have a fever and your symptoms suddenly get worse.   °· You are being physically or sexually abused.   °MAKE SURE YOU: °· Understand these instructions. °· Will watch your condition. °· Will get help if you are not doing well or get worse. °Document  Released: 05/02/2004 Document Revised: 12/05/2011 Document Reviewed: 09/25/2011 °ExitCare® Patient Information ©2014 [employer]. ° °

## 2013-09-18 NOTE — Assessment & Plan Note (Signed)
Will begin workup with diagnostic laparoscopy.  Risks include but are not limited to bleeding, infection, injury to surrounding structures, including bowel, bladder and ureters, blood clots, and death.  Likelihood of success is high. Consider referral to Urology next.

## 2013-09-18 NOTE — Progress Notes (Signed)
    Subjective:    Patient ID: Kristen [name] is a 33 y.o. female presenting with Results, Pelvic Pain and Fibroids  on 09/18/2013  HPI: Pt. Has a very long h/o pelvic pain.  Unclear etiology. Has small Previous w/u has included findings of fibroids.  Pt. Additionally has a ventral wall hernia which bothers her.  No h/o laparoscopy or cysto to look at other potential pathology.  She is nulliparous and desires preservation of fertility.  She is on disability and has numerous other health problems including fibromyalgia and SCT and scoliosis. Has been referred to [facility] and she was told that she hasn't had a full work-up, to rule out IBS or IC.  Review of Systems  Constitutional: Negative for fever and chills.  Gastrointestinal: Positive for abdominal pain.  Genitourinary: Positive for pelvic pain.      Objective:    BP 134/81  Pulse 72  Ht 5' 6.5" (1.689 m)  Wt 148 lb 12.8 oz (67.495 kg)  BMI 23.66 kg/m2 Physical Exam  Vitals reviewed. Constitutional: She appears well-developed and well-nourished. No distress.  Cardiovascular: Normal rate and regular rhythm.   Pulmonary/Chest: Effort normal.  Abdominal: Soft. There is tenderness.  Neurological: She is alert.  Skin: Skin is warm.  Psychiatric: She has a normal mood and affect.        Assessment & Plan:  FIBROIDS, UTERUS ? Related to pelvic pain  Pelvic pain in female Will begin workup with diagnostic laparoscopy.  Risks include but are not limited to bleeding, infection, injury to surrounding structures, including bowel, bladder and ureters, blood clots, and death.  Likelihood of success is high. Consider referral to Urology next.     Return in about 3 months (around 12/18/2013) for postop check.

## 2013-10-14 ENCOUNTER — Encounter (HOSPITAL_COMMUNITY): Payer: Self-pay | Admitting: Pharmacist

## 2013-10-16 ENCOUNTER — Telehealth: Payer: Self-pay | Admitting: *Deleted

## 2013-10-16 NOTE — Telephone Encounter (Signed)
[facility] called to inform us that the pharmacist misfilled patients rx of Necon. She was given Necon 1/35 mg tablets instead of Necon 10-11. They have corrected the issue on their end and just wanted to alert Dr. Kennon Rounds of this issue.

## 2013-10-24 NOTE — H&P (Signed)
Kristen [name] is an 33 y.o. G0P0 Unknown female.   Chief Complaint: chronic pelvic pain  HPI: Pt. Has a very long h/o pelvic pain. Unclear etiology. Workup has included findings of fibroids. Pt. Additionally has a ventral wall hernia which bothers her. No h/o laparoscopy or cysto to look at other potential pathology. She is nulliparous and desires preservation of fertility. She is on disability and has numerous other health problems including fibromyalgia and SCT and scoliosis. Has been referred to [facility] and she was told that she hasn't had a full work-up, to rule out IBS or IC. She was referred for possible minimally invasive myomectomy. Also has a small dermoid.  Also has long h/o infertility, and desires to maintain her organs.   Past Medical History  Diagnosis Date  . Sickle cell trait   . Borderline diabetes   . Scoliosis   . Hernia, umbilical   . Arthritis   . Depression   . Anxiety   . Panic attack   . OCD (obsessive compulsive disorder)   . Headache(784.0)   . Fibromyalgia   . Carpal tunnel syndrome on both sides   . Fibroid, uterine     Past Surgical History  Procedure Laterality Date  . Back surgery    . Wisdom tooth extraction      Family History  Problem Relation Age of Onset  . Hypertension Mother   . Diabetes Mother   . Fibromyalgia Mother   . Stroke Mother   . Cancer Maternal Grandmother    Social History:  reports that she has been smoking Cigarettes.  She has a 7 pack-year smoking history. She does not have any smokeless tobacco history on file. She reports that she drinks alcohol. She reports that she uses illicit drugs (Marijuana).  Allergies:  Allergies  Allergen Reactions  . Tramadol Hcl Hives and Itching    No current facility-administered medications on file prior to encounter.   Current Outpatient Prescriptions on File Prior to Encounter  Medication Sig Dispense Refill  . cyclobenzaprine (FLEXERIL) 5 MG tablet Take 1 tablet (5 mg  total) by mouth 3 (three) times daily as needed for muscle spasms.  30 tablet  0  . ibuprofen (ADVIL,MOTRIN) 600 MG tablet TAKE 1 TABLET (600 MG TOTAL) BY MOUTH EVERY 6 (SIX) HOURS AS NEEDED FOR PAIN.  30 tablet  1  . mirtazapine (REMERON) 15 MG tablet Take 15 mg by mouth at bedtime.      . Norethindrone-Ethinyl Estradiol Biphasic (NECON 10/11, 28,) 0.5-35/1-35 MG-MCG tablet TAKE ACTIVE PILLS CONTINUOUSLY- DON'T TAKE THE PLACEBOS.  28 tablet  12  . omeprazole (PRILOSEC) 20 MG capsule TAKE 1 CAPSULE EVERY DAY  15 capsule  0  . ondansetron (ZOFRAN ODT) 8 MG disintegrating tablet Take 1 tablet (8 mg total) by mouth every 8 (eight) hours as needed for nausea.  20 tablet  0  . pregabalin (LYRICA) 50 MG capsule Take 50 mg by mouth 2 (two) times daily.      Marland Kitchen albuterol (PROVENTIL HFA;VENTOLIN HFA) 108 (90 BASE) MCG/ACT inhaler Inhale 1 puff into the lungs every 6 (six) hours as needed for wheezing or shortness of breath.        Pertinent items are noted in HPI.  General appearance: alert, cooperative and appears stated age Neck: supple, symmetrical, trachea midline Lungs: normal effort Heart: regular rate and rhythm Abdomen: soft, non-tender; bowel sounds normal; no masses,  no organomegaly Extremities: extremities normal, atraumatic, no cyanosis or edema Skin: Skin color, texture,  turgor normal. No rashes or lesions Neurologic: Grossly normal   Lab Results  Component Value Date   WBC 9.6 05/01/2013   HGB 12.0 05/01/2013   HCT 33.3* 05/01/2013   MCV 84.9 05/01/2013   PLT 313 05/01/2013   Lab Results  Component Value Date   PREGTESTUR NEGATIVE 05/01/2013     Assessment/Plan Patient Active Problem List   Diagnosis Date Noted  . Pelvic pain in female 09/18/2013    Priority: Medium  . FIBROIDS, UTERUS 07/13/2009    Priority: Medium  . Dermoid cyst of left ovary 11/15/2011  . DEPRESSION 09/07/2010  . BIPOLAR DISORDER UNSPECIFIED 07/13/2009  . ABDOMINAL WALL HERNIA 06/17/2009  .  TREMOR 06/17/2009  . ACUTE CYSTITIS 03/08/2009  . SICKLE CELL TRAIT 03/04/2009  . NECK PAIN 03/04/2009  . ABDOMINAL PAIN, GENERALIZED 03/04/2009  . HYPERGLYCEMIA 12/11/2008  . SCOLIOSIS , IDIOPATHIC 11/24/2008  . CHRONIC PAIN SYNDROME 11/09/2008  . INSOMNIA 11/09/2008  . ARTHRALGIA 10/30/2008   For diagnostic laparoscopy. Risks include but are not limited to bleeding, infection, injury to surrounding structures, including bowel, bladder and ureters, blood clots, and death.  Likelihood of success is high.   Donnamae Jude 10/24/2013, 2:15 PM

## 2013-10-28 ENCOUNTER — Encounter (HOSPITAL_COMMUNITY)
Admission: RE | Admit: 2013-10-28 | Discharge: 2013-10-28 | Disposition: A | Discharge status: Home / Self Care | Payer: Medicare Other | Source: Ambulatory Visit | Attending: Family Medicine | Admitting: Family Medicine

## 2013-10-28 ENCOUNTER — Encounter (HOSPITAL_COMMUNITY): Payer: Self-pay

## 2013-10-28 HISTORY — DX: Bipolar disorder, unspecified: F31.9

## 2013-10-28 HISTORY — DX: Personal history of other diseases of the respiratory system: Z87.09

## 2013-10-28 HISTORY — DX: Gastro-esophageal reflux disease without esophagitis: K21.9

## 2013-10-28 HISTORY — DX: Anemia, unspecified: D64.9

## 2013-10-28 LAB — CBC
HCT: 33.2 % — ABNORMAL LOW (ref 36.0–46.0)
HEMOGLOBIN: 11.7 g/dL — AB (ref 12.0–15.0)
MCH: 31 pg (ref 26.0–34.0)
MCHC: 35.2 g/dL (ref 30.0–36.0)
MCV: 88.1 fL (ref 78.0–100.0)
PLATELETS: 280 10*3/uL (ref 150–400)
RBC: 3.77 MIL/uL — AB (ref 3.87–5.11)
RDW: 13 % (ref 11.5–15.5)
WBC: 7.1 10*3/uL (ref 4.0–10.5)

## 2013-10-28 NOTE — Patient Instructions (Addendum)
   Your procedure is scheduled on: Wednesday, May 13  Enter through the [employer] [facility] at: [place] up the phone at the desk and dial [phone] and inform us of your arrival.  Please call this number if you have any problems the morning of surgery: [phone]  Remember: Do not eat food after midnight: Tuesday Do not drink clear liquids after: 9 AM Wednesday, day of surgery   Take these medicines the morning of surgery with a SIP OF WATER:  Prilosec, bring albuterol inhaler  Do not wear jewelry, make-up, or FINGER nail polish No metal in your hair or on your body. Do not wear lotions, powders, perfumes.  You may wear deodorant.  Do not bring valuables to the hospital. Contacts, dentures or bridgework may not be worn into surgery.   Patients discharged on the day of surgery will not be allowed to drive home.  Home with mother Hoyle Sauer cell [phone]

## 2013-10-29 ENCOUNTER — Ambulatory Visit (HOSPITAL_COMMUNITY)
Admission: RE | Admit: 2013-10-29 | Discharge: 2013-10-29 | Disposition: A | Discharge status: Home / Self Care | Payer: Medicare Other | Source: Ambulatory Visit | Attending: Family Medicine | Admitting: Family Medicine

## 2013-10-29 ENCOUNTER — Ambulatory Visit (HOSPITAL_COMMUNITY): Payer: Medicare Other | Admitting: Anesthesiology

## 2013-10-29 ENCOUNTER — Encounter (HOSPITAL_COMMUNITY)
Admission: RE | Disposition: A | Discharge status: Home / Self Care | Payer: Self-pay | Source: Ambulatory Visit | Attending: Family Medicine

## 2013-10-29 ENCOUNTER — Encounter (HOSPITAL_COMMUNITY): Payer: Medicare Other | Admitting: Anesthesiology

## 2013-10-29 ENCOUNTER — Encounter (HOSPITAL_COMMUNITY): Payer: Self-pay | Admitting: Anesthesiology

## 2013-10-29 DIAGNOSIS — K429 Umbilical hernia without obstruction or gangrene: Secondary | ICD-10-CM | POA: Insufficient documentation

## 2013-10-29 DIAGNOSIS — N80209 Endometriosis of unspecified fallopian tube, unspecified depth: Secondary | ICD-10-CM | POA: Insufficient documentation

## 2013-10-29 DIAGNOSIS — D571 Sickle-cell disease without crisis: Secondary | ICD-10-CM | POA: Insufficient documentation

## 2013-10-29 DIAGNOSIS — N949 Unspecified condition associated with female genital organs and menstrual cycle: Secondary | ICD-10-CM | POA: Insufficient documentation

## 2013-10-29 DIAGNOSIS — N802 Endometriosis of fallopian tube: Secondary | ICD-10-CM | POA: Insufficient documentation

## 2013-10-29 DIAGNOSIS — G8929 Other chronic pain: Secondary | ICD-10-CM | POA: Insufficient documentation

## 2013-10-29 DIAGNOSIS — F341 Dysthymic disorder: Secondary | ICD-10-CM | POA: Insufficient documentation

## 2013-10-29 DIAGNOSIS — K219 Gastro-esophageal reflux disease without esophagitis: Secondary | ICD-10-CM | POA: Insufficient documentation

## 2013-10-29 DIAGNOSIS — N736 Female pelvic peritoneal adhesions (postinfective): Secondary | ICD-10-CM | POA: Diagnosis present

## 2013-10-29 DIAGNOSIS — N803 Endometriosis of pelvic peritoneum, unspecified: Secondary | ICD-10-CM

## 2013-10-29 DIAGNOSIS — R102 Pelvic and perineal pain: Secondary | ICD-10-CM | POA: Diagnosis present

## 2013-10-29 DIAGNOSIS — N971 Female infertility of tubal origin: Secondary | ICD-10-CM | POA: Diagnosis present

## 2013-10-29 DIAGNOSIS — IMO0001 Reserved for inherently not codable concepts without codable children: Secondary | ICD-10-CM | POA: Insufficient documentation

## 2013-10-29 DIAGNOSIS — F17200 Nicotine dependence, unspecified, uncomplicated: Secondary | ICD-10-CM | POA: Insufficient documentation

## 2013-10-29 DIAGNOSIS — A5485 Gonococcal peritonitis: Secondary | ICD-10-CM | POA: Insufficient documentation

## 2013-10-29 DIAGNOSIS — M4120 Other idiopathic scoliosis, site unspecified: Secondary | ICD-10-CM | POA: Insufficient documentation

## 2013-10-29 DIAGNOSIS — D259 Leiomyoma of uterus, unspecified: Secondary | ICD-10-CM | POA: Insufficient documentation

## 2013-10-29 DIAGNOSIS — R7309 Other abnormal glucose: Secondary | ICD-10-CM | POA: Insufficient documentation

## 2013-10-29 HISTORY — PX: LAPAROSCOPY: SHX197

## 2013-10-29 LAB — PREGNANCY, URINE: PREG TEST UR: NEGATIVE

## 2013-10-29 SURGERY — LAPAROSCOPY, DIAGNOSTIC
Anesthesia: General | Site: Abdomen

## 2013-10-29 MED ORDER — NEOSTIGMINE METHYLSULFATE 10 MG/10ML IV SOLN
INTRAVENOUS | Status: DC | PRN
  Administered 2013-10-29: 3 mg via INTRAVENOUS
  Administered 2013-10-29: 1 mg via INTRAVENOUS

## 2013-10-29 MED ORDER — MIDAZOLAM HCL 2 MG/2ML IJ SOLN
INTRAMUSCULAR | Status: DC | PRN
  Administered 2013-10-29: 2 mg via INTRAVENOUS

## 2013-10-29 MED ORDER — LIDOCAINE HCL (CARDIAC) 20 MG/ML IV SOLN
INTRAVENOUS | Status: AC
  Filled 2013-10-29: qty 5

## 2013-10-29 MED ORDER — FENTANYL CITRATE 0.05 MG/ML IJ SOLN
25.0000 ug | INTRAMUSCULAR | Status: DC | PRN
  Administered 2013-10-29: 50 ug via INTRAVENOUS

## 2013-10-29 MED ORDER — LIDOCAINE HCL (CARDIAC) 20 MG/ML IV SOLN
INTRAVENOUS | Status: DC | PRN
  Administered 2013-10-29: 80 mg via INTRAVENOUS

## 2013-10-29 MED ORDER — KETOROLAC TROMETHAMINE 30 MG/ML IJ SOLN
INTRAMUSCULAR | Status: DC | PRN
  Administered 2013-10-29: 30 mg via INTRAVENOUS

## 2013-10-29 MED ORDER — LACTATED RINGERS IV SOLN
INTRAVENOUS | Status: DC
  Administered 2013-10-29 (×2): via INTRAVENOUS

## 2013-10-29 MED ORDER — PROPOFOL 10 MG/ML IV BOLUS
INTRAVENOUS | Status: DC | PRN
  Administered 2013-10-29: 150 mg via INTRAVENOUS

## 2013-10-29 MED ORDER — MIDAZOLAM HCL 2 MG/2ML IJ SOLN
INTRAMUSCULAR | Status: AC
  Filled 2013-10-29: qty 2

## 2013-10-29 MED ORDER — GLYCOPYRROLATE 0.2 MG/ML IJ SOLN
INTRAMUSCULAR | Status: AC
  Filled 2013-10-29: qty 3

## 2013-10-29 MED ORDER — KETOROLAC TROMETHAMINE 30 MG/ML IJ SOLN: 15.0000 mg | Freq: Once | INTRAMUSCULAR | Status: DC | PRN

## 2013-10-29 MED ORDER — PROPOFOL 10 MG/ML IV EMUL
INTRAVENOUS | Status: AC
  Filled 2013-10-29: qty 20

## 2013-10-29 MED ORDER — BUPIVACAINE HCL (PF) 0.25 % IJ SOLN
INTRAMUSCULAR | Status: AC
  Filled 2013-10-29: qty 30

## 2013-10-29 MED ORDER — KETOROLAC TROMETHAMINE 30 MG/ML IJ SOLN
INTRAMUSCULAR | Status: AC
  Filled 2013-10-29: qty 1

## 2013-10-29 MED ORDER — FAMOTIDINE 20 MG PO TABS
ORAL_TABLET | ORAL | Status: AC
  Administered 2013-10-29: 12:00:00 20 mg via ORAL
  Filled 2013-10-29: qty 1

## 2013-10-29 MED ORDER — FENTANYL CITRATE 0.05 MG/ML IJ SOLN
INTRAMUSCULAR | Status: DC | PRN
  Administered 2013-10-29 (×5): 50 ug via INTRAVENOUS

## 2013-10-29 MED ORDER — ROCURONIUM BROMIDE 100 MG/10ML IV SOLN
INTRAVENOUS | Status: DC | PRN
  Administered 2013-10-29: 40 mg via INTRAVENOUS

## 2013-10-29 MED ORDER — ACETAMINOPHEN 160 MG/5ML PO SOLN
ORAL | Status: AC
  Filled 2013-10-29: qty 40.6

## 2013-10-29 MED ORDER — ROCURONIUM BROMIDE 100 MG/10ML IV SOLN
INTRAVENOUS | Status: AC
  Filled 2013-10-29: qty 1

## 2013-10-29 MED ORDER — DEXAMETHASONE SODIUM PHOSPHATE 10 MG/ML IJ SOLN
INTRAMUSCULAR | Status: AC
  Filled 2013-10-29: qty 1

## 2013-10-29 MED ORDER — GLYCOPYRROLATE 0.2 MG/ML IJ SOLN
INTRAMUSCULAR | Status: DC | PRN
  Administered 2013-10-29: 0.6 mg via INTRAVENOUS
  Administered 2013-10-29: 0.2 mg via INTRAVENOUS

## 2013-10-29 MED ORDER — METOCLOPRAMIDE HCL 5 MG/ML IJ SOLN: 10.0000 mg | Freq: Once | INTRAMUSCULAR | Status: DC | PRN

## 2013-10-29 MED ORDER — FENTANYL CITRATE 0.05 MG/ML IJ SOLN
INTRAMUSCULAR | Status: AC
  Filled 2013-10-29: qty 5

## 2013-10-29 MED ORDER — DEXAMETHASONE SODIUM PHOSPHATE 10 MG/ML IJ SOLN
INTRAMUSCULAR | Status: DC | PRN
  Administered 2013-10-29: 10 mg via INTRAVENOUS

## 2013-10-29 MED ORDER — NEOSTIGMINE METHYLSULFATE 10 MG/10ML IV SOLN
INTRAVENOUS | Status: AC
  Filled 2013-10-29: qty 1

## 2013-10-29 MED ORDER — MEPERIDINE HCL 25 MG/ML IJ SOLN: 6.2500 mg | INTRAMUSCULAR | Status: DC | PRN

## 2013-10-29 MED ORDER — FENTANYL CITRATE 0.05 MG/ML IJ SOLN
INTRAMUSCULAR | Status: AC
  Administered 2013-10-29: 50 ug via INTRAVENOUS
  Filled 2013-10-29: qty 2

## 2013-10-29 MED ORDER — BUPIVACAINE HCL (PF) 0.25 % IJ SOLN
INTRAMUSCULAR | Status: DC | PRN
  Administered 2013-10-29: 14 mL

## 2013-10-29 MED ORDER — FAMOTIDINE 20 MG PO TABS
20.0000 mg | ORAL_TABLET | Freq: Once | ORAL | Status: AC
  Administered 2013-10-29: 20 mg via ORAL

## 2013-10-29 MED ORDER — ONDANSETRON HCL 4 MG/2ML IJ SOLN
INTRAMUSCULAR | Status: AC
Start: 2013-10-29 — End: 2013-10-29
  Filled 2013-10-29: qty 2

## 2013-10-29 MED ORDER — ONDANSETRON HCL 4 MG/2ML IJ SOLN
INTRAMUSCULAR | Status: DC | PRN
  Administered 2013-10-29: 4 mg via INTRAVENOUS

## 2013-10-29 MED ORDER — LACTATED RINGERS IV SOLN: INTRAVENOUS | Status: DC

## 2013-10-29 MED ORDER — OXYCODONE-ACETAMINOPHEN 5-325 MG PO TABS: 1.0000 | ORAL_TABLET | Freq: Four times a day (QID) | ORAL | Status: DC | PRN

## 2013-10-29 MED ORDER — ACETAMINOPHEN 160 MG/5ML PO SOLN
975.0000 mg | Freq: Once | ORAL | Status: AC
  Administered 2013-10-29: 975 mg via ORAL

## 2013-10-29 SURGICAL SUPPLY — 23 items
CABLE HIGH FREQUENCY MONO STRZ (ELECTRODE) IMPLANT
CATH ROBINSON RED A/P 16FR (CATHETERS) IMPLANT
CHLORAPREP W/TINT 26ML (MISCELLANEOUS) ×3 IMPLANT
CLOTH BEACON ORANGE TIMEOUT ST (SAFETY) ×3 IMPLANT
DRSG COVADERM PLUS 2X2 (GAUZE/BANDAGES/DRESSINGS) ×4 IMPLANT
GLOVE BIOGEL PI IND STRL 7.0 (GLOVE) ×1 IMPLANT
GLOVE BIOGEL PI INDICATOR 7.0 (GLOVE) ×2
GLOVE ECLIPSE 7.0 STRL STRAW (GLOVE) ×6 IMPLANT
GOWN STRL REUS W/TWL LRG LVL3 (GOWN DISPOSABLE) ×9 IMPLANT
NS IRRIG 1000ML POUR BTL (IV SOLUTION) ×3 IMPLANT
PACK LAPAROSCOPY BASIN (CUSTOM PROCEDURE TRAY) ×3 IMPLANT
PROTECTOR NERVE ULNAR (MISCELLANEOUS) ×3 IMPLANT
SET IRRIG TUBING LAPAROSCOPIC (IRRIGATION / IRRIGATOR) IMPLANT
SUT VIC AB 3-0 X1 27 (SUTURE) ×3 IMPLANT
SUT VICRYL 0 UR6 27IN ABS (SUTURE) ×6 IMPLANT
SUT VICRYL 4-0 PS2 18IN ABS (SUTURE) ×3 IMPLANT
TOWEL OR 17X24 6PK STRL BLUE (TOWEL DISPOSABLE) ×6 IMPLANT
TRAY FOLEY CATH 14FR (SET/KITS/TRAYS/PACK) ×3 IMPLANT
TROCAR BALLN 12MMX100 BLUNT (TROCAR) IMPLANT
TROCAR OPTI TIP 5M 100M (ENDOMECHANICALS) ×6 IMPLANT
TROCAR XCEL DIL TIP R 11M (ENDOMECHANICALS) IMPLANT
WARMER LAPAROSCOPE (MISCELLANEOUS) ×3 IMPLANT
WATER STERILE IRR 1000ML POUR (IV SOLUTION) ×3 IMPLANT

## 2013-10-29 NOTE — Discharge Instructions (Signed)
DISCHARGE INSTRUCTIONS: Laparoscopy  The following instructions have been prepared to help you care for yourself upon your return home today.  MAY TAKE IBUPROFEN (MOTRIN, ADVIL) OR ALEVE AFTER 8:45 PM FOR CRAMPS!!!  Wound care:  Do not get the incision wet for the first 24 hours. The incision should be kept clean and dry.  The Band-Aids or dressings may be removed the day after surgery.  Should the incision become sore, red, and swollen after the first week, check with your doctor.  Personal hygiene:  Shower the day after your procedure.  Activity and limitations:  Do NOT drive or operate any equipment today.  Do NOT lift anything more than 15 pounds for 2-3 weeks after surgery.  Do NOT rest in bed all day.  Walking is encouraged. Walk each day, starting slowly with 5-minute walks 3 or 4 times a day. Slowly increase the length of your walks.  Walk up and down stairs slowly.  Do NOT do strenuous activities, such as golfing, playing tennis, bowling, running, biking, weight lifting, gardening, mowing, or vacuuming for 2-4 weeks. Ask your doctor when it is okay to start.  Diet: Eat a light meal as desired this evening. You may resume your usual diet tomorrow.  Return to work: This is dependent on the type of work you do. For the most part you can return to a desk job within a week of surgery. If you are more active at work, please discuss this with your doctor.  What to expect after your surgery: You may have a slight burning sensation when you urinate on the first day. You may have a very small amount of blood in the urine. Expect to have a small amount of vaginal discharge/light bleeding for 1-2 weeks. It is not unusual to have abdominal soreness and bruising for up to 2 weeks. You may be tired and need more rest for about 1 week. You may experience shoulder pain for 24-72 hours. Lying flat in bed may relieve it.  Call your doctor for any of the following:  Develop a fever of  100.4 or greater  Inability to urinate 6 hours after discharge from hospital  Severe pain not relieved by pain medications  Persistent of heavy bleeding at incision site  Redness or swelling around incision site after a week  Increasing nausea or vomiting  Patient Signature________________________________________ Nurse Signature_________________________________________

## 2013-10-29 NOTE — Anesthesia Preprocedure Evaluation (Addendum)
Anesthesia Evaluation  Patient identified by MRN, date of birth, ID band Patient awake    Reviewed: Allergy & Precautions, H&P , NPO status , Patient's Chart, lab work & pertinent test results, reviewed documented beta blocker date and time   History of Anesthesia Complications Negative for: history of anesthetic complications  Airway Mallampati: II TM Distance: >3 FB Neck ROM: limited    Dental  (+) Teeth Intact   Pulmonary Current Smoker,  Occasional albuterol for "bronchitis" and nasonex for allergies breath sounds clear to auscultation  Pulmonary exam normal       Cardiovascular negative cardio ROS  Rhythm:regular Rate:Normal     Neuro/Psych  Headaches, PSYCHIATRIC DISORDERS (OCD, panic attacks) Anxiety Depression Bipolar Disorder scoliosis  Neuromuscular disease (carpal tunnel) negative psych ROS   GI/Hepatic negative GI ROS, GERD-  Medicated,(+)       marijuana use,   Endo/Other  diabetes (borderline), Type 2  Renal/GU negative Renal ROS  Female GU complaint (chronic pelvic pain)     Musculoskeletal  (+) Arthritis -, Fibromyalgia -  Abdominal   Peds  Hematology negative hematology ROS (+) Sickle cell trait ,   Anesthesia Other Findings Not taking narcotic pain medicines - per pt Not taking remeron or lyrica for several months - per pt  Reproductive/Obstetrics negative OB ROS                          Anesthesia Physical Anesthesia Plan  ASA: II  Anesthesia Plan: General ETT   Post-op Pain Management:    Induction:   Airway Management Planned:   Additional Equipment:   Intra-op Plan:   Post-operative Plan:   Informed Consent: I have reviewed the patients History and Physical, chart, labs and discussed the procedure including the risks, benefits and alternatives for the proposed anesthesia with the patient or authorized representative who has indicated his/her  understanding and acceptance.   Dental Advisory Given  Plan Discussed with: CRNA and Surgeon  Anesthesia Plan Comments:         Anesthesia Quick Evaluation

## 2013-10-29 NOTE — Interval H&P Note (Signed)
History and Physical Interval Note:  10/29/2013 1:27 PM  Kristen [name]  has presented today for surgery, with the diagnosis of cpt [phone] - Very long history of pelvic pain and fibroid  The various methods of treatment have been discussed with the patient and family. After consideration of risks, benefits and other options for treatment, the patient has consented to  Procedure(s): LAPAROSCOPY DIAGNOSTIC (N/A) as a surgical intervention .  The patient's history has been reviewed, patient examined, no change in status, stable for surgery.  I have reviewed the patient's chart and labs.  Questions were answered to the patient's satisfaction.     Donnamae Jude

## 2013-10-29 NOTE — Transfer of Care (Signed)
Immediate Anesthesia Transfer of Care Note  Patient: Kristen [name]  Procedure(s) Performed: Procedure(s): LAPAROSCOPY DIAGNOSTIC (N/A)  Patient Location: PACU  Anesthesia Type:General  Level of Consciousness: awake, alert  and oriented  Airway & Oxygen Therapy: Patient Spontanous Breathing and Patient connected to nasal cannula oxygen  Post-op Assessment: Report given to PACU RN, Post -op Vital signs reviewed and stable and Patient moving all extremities  Post vital signs: Reviewed and stable  Complications: No apparent anesthesia complications

## 2013-10-29 NOTE — Op Note (Signed)
Preoperative diagnosis: Pelvic pain, fibroid uterus, infertility   Postoperative diagnosis: Same + possible endometriosis  Procedure: Diagnostic laparoscopy  Surgeon: Darron Doom, MD  Anesthesia: Tina Griffiths, MD  Findings: Rochele Raring disease, large multi-lobulated uterus with multiple fibroids, blunted tubal ends with adhesions of tubes to ovaries, sidewalls and uterus. Normal appearing appendix and ovaries. Darkened area on right tube possibly consistent with endometriosis  Estimated blood loss: Minimal  Complications: None known  Specimens: Right tubal biopsy  Disposition of Specimen:  Pathology  Reason for procedure: 33 y.o. G0P0 with h/o chronic pelvic pain and known fibroid uterus and chronic pain.  Procedure: Patient was taken to the operating room was placed in dorsal lithotomy in [place]. She was prepped and draped in the usual sterile fashion. A timeout was performed. The patient had SCDs in place. Red Rubber catheter is used to drain bladder. Speculum was placed inside the vagina. The cervix was visualized and grasped anteriorly with a single-tooth tenaculum. A Hulka tenaculum was placed through the cervix for uterine manipulation. The single tooth tenaculum and speculum were removed from the vagina.  Attention was then turned to the abdomen. Four cc of 0.25% Marcaine was injected at the umbilicus. Two Allis clamps were used to tent up the skin of the umbilicus a vertical one half centimeter incision was made here. The fascia was incised with the knife  And the peritoneum was entered sharply with this incision. Two edges of the fascia were tagged with a 0 Vicryl suture on a UR 6 needle. A Hassan trocar was placed through this incision and a pneumoperitoneum was created. The patient was then placed in Trendelenburg. The pelvis was inspected in a systematic fashion. The above findings were noted.  An area of possible endometriosis was biopsied. The patient was not  consented to further surgery, so the procedure was terminated. All instrument, needle  and lap counts were correct x 2. The patient was awakened to recovery in stable condition.  Standley Dakins PrattMD 10/29/2013 2:53 PM

## 2013-10-30 ENCOUNTER — Encounter (HOSPITAL_COMMUNITY): Payer: Self-pay | Admitting: Family Medicine

## 2013-10-31 NOTE — Anesthesia Postprocedure Evaluation (Signed)
  Anesthesia Post-op Note  Patient: Kristen [name]  Procedure(s) Performed: Procedure(s): LAPAROSCOPY DIAGNOSTIC (N/A) Patient is awake and responsive. Pain and nausea are reasonably well controlled. Vital signs are stable and clinically acceptable. Oxygen saturation is clinically acceptable. There are no apparent anesthetic complications at this time. Patient is ready for discharge.

## 2013-11-20 ENCOUNTER — Encounter: Payer: Self-pay | Admitting: Family Medicine

## 2013-11-20 ENCOUNTER — Ambulatory Visit (INDEPENDENT_AMBULATORY_CARE_PROVIDER_SITE_OTHER): Payer: Medicare Other | Admitting: Family Medicine

## 2013-11-20 VITALS — BP 134/85 | HR 83 | Ht 66.5 in | Wt 148.2 lb

## 2013-11-20 DIAGNOSIS — N949 Unspecified condition associated with female genital organs and menstrual cycle: Secondary | ICD-10-CM | POA: Diagnosis not present

## 2013-11-20 DIAGNOSIS — R102 Pelvic and perineal pain: Secondary | ICD-10-CM

## 2013-11-20 DIAGNOSIS — N809 Endometriosis, unspecified: Secondary | ICD-10-CM | POA: Diagnosis not present

## 2013-11-20 NOTE — Assessment & Plan Note (Signed)
Findings at surgery discussed at length. Options of treatment including hysterectomy +/- oophorectomy, implications of menopause, trial of Depo-Lupron, continued OC's, at length. Discussed impact on fertility, possibility of continued pain despite pelvic clean out, role of hysterectomy and possible need for subsequent oophorectomy related to continued pain.  30 mins were spent in consultation with this patient. Pt. Elected for hysterectomy. Risks include but are not limited to bleeding, infection, injury to surrounding structures, including bowel, bladder and ureters, blood clots, and death. Risks/benefits of ovarian removal discussed.  Risk of bleeding with surgery is increased. Immediate menopause, increased risk of all cause mortality, mood swings, night sweats, hot flashes, bone loss, cardioprotection all discussed.

## 2013-11-20 NOTE — Patient Instructions (Signed)
Menopause Menopause is the normal time of life when menstrual periods stop completely. Menopause is complete when you have missed 12 consecutive menstrual periods. It usually occurs between the ages of 8 years and 48 years. Very rarely does a woman develop menopause before the age of 60 years. At menopause, your ovaries stop producing the female hormones estrogen and progesterone. This can cause undesirable symptoms and also affect your health. Sometimes the symptoms may occur 4 5 years before the menopause begins. There is no relationship between menopause and:  Oral contraceptives.  Number of children you had.  Race.  The age your menstrual periods started (menarche). Heavy smokers and very thin women may develop menopause earlier in life. CAUSES  The ovaries stop producing the female hormones estrogen and progesterone.  Other causes include:  Surgery to remove both ovaries.  The ovaries stop functioning for no known reason.  Tumors of the pituitary gland in the brain.  Medical disease that affects the ovaries and hormone production.  Radiation treatment to the abdomen or pelvis.  Chemotherapy that affects the ovaries. SYMPTOMS   Hot flashes.  Night sweats.  Decrease in sex drive.  Vaginal dryness and thinning of the vagina causing painful intercourse.  Dryness of the skin and developing wrinkles.  Headaches.  Tiredness.  Irritability.  Memory problems.  Weight gain.  Bladder infections.  Hair growth of the face and chest.  Infertility. More serious symptoms include:  Loss of bone (osteoporosis) causing breaks (fractures).  Depression.  Hardening and narrowing of the arteries (atherosclerosis) causing heart attacks and strokes. DIAGNOSIS   When the menstrual periods have stopped for 12 straight months.  Physical exam.  Hormone studies of the blood. TREATMENT  There are many treatment choices and nearly as many questions about them. The  decisions to treat or not to treat menopausal changes is an individual choice made with your health care provider. Your health care provider can discuss the treatments with you. Together, you can decide which treatment will work best for you. Your treatment choices may include:   Hormone therapy (estrogen and progesterone).  Non-hormonal medicines.  Treating the individual symptoms with medicine (for example antidepressants for depression).  Herbal medicines that may help specific symptoms.  Counseling by a psychiatrist or psychologist.  Group therapy.  Lifestyle changes including:  Eating healthy.  Regular exercise.  Limiting caffeine and alcohol.  Stress management and meditation.  No treatment. HOME CARE INSTRUCTIONS   Take the medicine your health care provider gives you as directed.  Get plenty of sleep and rest.  Exercise regularly.  Eat a diet that contains calcium (good for the bones) and soy products (acts like estrogen hormone).  Avoid alcoholic beverages.  Do not smoke.  If you have hot flashes, dress in layers.  Take supplements, calcium, and vitamin D to strengthen bones.  You can use over-the-counter lubricants or moisturizers for vaginal dryness.  Group therapy is sometimes very helpful.  Acupuncture may be helpful in some cases. SEEK MEDICAL CARE IF:   You are not sure you are in menopause.  You are having menopausal symptoms and need advice and treatment.  You are still having menstrual periods after age 71 years.  You have pain with intercourse.  Menopause is complete (no menstrual period for 12 months) and you develop vaginal bleeding.  You need a referral to a specialist (gynecologist, psychiatrist, or psychologist) for treatment. SEEK IMMEDIATE MEDICAL CARE IF:   You have severe depression.  You have excessive vaginal bleeding.  You fell and think you have a broken bone.  You have pain when you urinate.  You develop leg or  chest pain.  You have a fast pounding heart beat (palpitations).  You have severe headaches.  You develop vision problems.  You feel a lump in your breast.  You have abdominal pain or severe indigestion. Document Released: 08/26/2003 Document Revised: 02/05/2013 Document Reviewed: 01/02/2013 [facility] Patient Information 2014 [place], [place]. Hysterectomy Information  A hysterectomy is a surgery in which your uterus is removed. This surgery may be done to treat various medical problems. After the surgery, you will no longer have menstrual periods. The surgery will also make you unable to become pregnant (sterile). The fallopian tubes and ovaries can be removed (bilateral salpingo-oophorectomy) during this surgery as well.  REASONS FOR A HYSTERECTOMY  Persistent, abnormal bleeding.  Lasting (chronic) pelvic pain or infection.  The lining of the uterus (endometrium) starts growing outside the uterus (endometriosis).  The endometrium starts growing in the muscle of the uterus (adenomyosis).  The uterus falls down into the vagina (pelvic organ prolapse).  Noncancerous growths in the uterus (uterine fibroids) that cause symptoms.  Precancerous cells.  Cervical cancer or uterine cancer. TYPES OF HYSTERECTOMIES  Supracervical hysterectomy In this type, the top part of the uterus is removed, but not the cervix.  Total hysterectomy The uterus and cervix are removed.  Radical hysterectomy The uterus, the cervix, and the fibrous tissue that holds the uterus in place in the pelvis (parametrium) are removed. WAYS A HYSTERECTOMY CAN BE PERFORMED  Abdominal hysterectomy A large surgical cut (incision) is made in the abdomen. The uterus is removed through this incision.  Vaginal hysterectomy An incision is made in the vagina. The uterus is removed through this incision. There are no abdominal incisions.  Conventional laparoscopic hysterectomy Three or four small incisions are made in the  abdomen. A thin, lighted tube with a camera (laparoscope) is inserted into one of the incisions. Other tools are put through the other incisions. The uterus is cut into small pieces. The small pieces are removed through the incisions, or they are removed through the vagina.  Laparoscopically assisted vaginal hysterectomy (LAVH) Three or four small incisions are made in the abdomen. Part of the surgery is performed laparoscopically and part vaginally. The uterus is removed through the vagina.  Robot-assisted laparoscopic hysterectomy A laparoscope and other tools are inserted into 3 or 4 small incisions in the abdomen. A computer-controlled device is used to give the surgeon a 3D image and to help control the surgical instruments. This allows for more precise movements of surgical instruments. The uterus is cut into small pieces and removed through the incisions or removed through the vagina. RISKS AND COMPLICATIONS  Possible complications associated with this procedure include:  Bleeding and risk of blood transfusion. Tell your health care provider if you do not want to receive any blood products.  Blood clots in the legs or lung.  Infection.  Injury to surrounding organs.  Problems or side effects related to anesthesia.  Conversion to an abdominal hysterectomy from one of the other techniques. WHAT TO EXPECT AFTER A HYSTERECTOMY  You will be given pain medicine.  You will need to have someone with you for the first 3 5 days after you go home.  You will need to follow up with your surgeon in 2 4 weeks after surgery to evaluate your progress.  You may have early menopause symptoms such as hot flashes, night sweats, and insomnia.  If you had a hysterectomy for a problem that was not cancer or not a condition that could lead to cancer, then you no longer need Pap tests. However, even if you no longer need a Pap test, a regular exam is a good idea to make sure no other problems are  starting. Document Released: 11/29/2000 Document Revised: 03/26/2013 Document Reviewed: 02/10/2013 [facility] Patient Information 2014 [place]. Endometriosis Endometriosis is a condition in which the tissue that lines the uterus (endometrium) grows outside of its normal location. The tissue may grow in many locations close to the uterus, but it commonly grows on the ovaries, fallopian tubes, vagina, or bowel. Because the uterus expels, or sheds, its lining every menstrual cycle, there is bleeding wherever the endometrial tissue is located. This can cause pain because blood is irritating to tissues not normally exposed to it.  CAUSES  The cause of endometriosis is not known.  SIGNS AND SYMPTOMS  Often, there are no symptoms. When symptoms are present, they can vary with the location of the displaced tissue. Various symptoms can occur at different times. Although symptoms occur mainly during a woman's menstrual period, they can also occur midcycle and usually stop with menopause. Some people may go months with no symptoms at all. Symptoms may include:   Back or abdominal pain.   Heavier bleeding during periods.   Pain during intercourse.   Painful bowel movements.   Infertility. DIAGNOSIS  Your health care provider will do a physical exam and ask about your symptoms. Various tests may be done, such as:   Blood tests and urine tests. These are done to help rule out other problems.   Ultrasound. This test is done to look for abnormal tissue.   An X-ray of the lower bowel (barium enema).  Laparoscopy. In this procedure, a thin, lighted tube with a tiny camera on the end (laparoscope) is inserted into your abdomen. This helps your health care provider look for abnormal tissue to confirm the diagnosis. The health care provider may also remove a small piece of tissue (biopsy) from any abnormal tissue found. This tissue sample can then be sent to a lab so it can be looked at under a  microscope. TREATMENT  Treatment will vary and may include:   Medicines to relieve pain. Nonsteroidal anti-inflammatory drugs (NSAIDs) are a type of pain medicine that can help to relieve the pain caused by endometriosis.  Hormonal therapy. When using hormonal therapy, periods are eliminated. This eliminates the monthly exposure to blood by the displaced endometrial tissue.   Surgery. Surgery may sometimes be done to remove the abnormal endometrial tissue. In severe cases, surgery may be done to remove the fallopian tubes, uterus, and ovaries (hysterectomy). HOME CARE INSTRUCTIONS   Only take over-the-counter or prescription medicines for pain, discomfort, or fever as directed by your health care provider. Do not take aspirin because it may increase bleeding when you are not on hormonal therapy.   Avoid activities that produce pain, including sexual activity. SEEK MEDICAL CARE IF:  You have pelvic pain before, after, or during your periods.  You have pelvic pain between periods that gets worse during your period.  You have pelvic pain during or after sex.  You have pelvic pain with bowel movements or urination, especially during your period.  You have problems getting pregnant. SEEK IMMEDIATE MEDICAL CARE IF:   Your pain is severe and is not responding to pain medicine.   You have severe nausea and vomiting, or you  cannot keep foods down.   You have pain that is limited to the right lower part of your abdomen.   You have swelling or increasing pain in your abdomen.   You see blood in your stool.   You have a fever or persistent symptoms for more than 2 3 days.   You have a fever and your symptoms suddenly get worse. MAKE SURE YOU:   Understand these instructions.  Will watch your condition.  Will get help right away if you are not doing well or get worse. Document Released: 06/02/2000 Document Revised: 03/26/2013 Document Reviewed: 01/31/2013 [facility] Patient  Information 2014 [place].

## 2013-11-20 NOTE — Progress Notes (Signed)
    Subjective:    Patient ID: Kristen [name] is a 33 y.o. female presenting with Routine Post Op  on 11/20/2013  HPI:  Here for f/u following laparoscopy.  She has a long h/o pelvic pain.  Found to have large multi-nodular fibroid uterus with endometriosis, proven on biopsy.  Also had pelvic adhesive disease with [employer].  Review of Systems  Constitutional: Negative for fever and chills.  Respiratory: Negative for shortness of breath.   Cardiovascular: Negative for chest pain.  Gastrointestinal: Negative for abdominal pain.  Genitourinary: Positive for pelvic pain and dyspareunia. Negative for dysuria.      Objective:    BP 134/85  Pulse 83  Ht 5' 6.5" (1.689 m)  Wt 148 lb 3.2 oz (67.223 kg)  BMI 23.56 kg/m2 Physical Exam  Constitutional: She appears well-developed and well-nourished. No distress.  HENT:  Head: Normocephalic and atraumatic.  Neck: Neck supple.  Cardiovascular: Normal rate.   Pulmonary/Chest: Effort normal.  Abdominal: Soft. There is tenderness.        Assessment & Plan:   Pelvic pain in female Findings at surgery discussed at length. Options of treatment including hysterectomy +/- oophorectomy, implications of menopause, trial of Depo-Lupron, continued OC's, at length. Discussed impact on fertility, possibility of continued pain despite pelvic clean out, role of hysterectomy and possible need for subsequent oophorectomy related to continued pain.  30 mins were spent in consultation with this patient. Pt. Elected for hysterectomy. Risks include but are not limited to bleeding, infection, injury to surrounding structures, including bowel, bladder and ureters, blood clots, and death. Risks/benefits of ovarian removal discussed.  Risk of bleeding with surgery is increased. Immediate menopause, increased risk of all cause mortality, mood swings, night sweats, hot flashes, bone loss, cardioprotection all discussed.         Return in about 4 weeks  (around 12/18/2013) for a follow-up.

## 2013-12-22 ENCOUNTER — Ambulatory Visit: Payer: Medicare Other | Admitting: Obstetrics & Gynecology

## 2013-12-22 ENCOUNTER — Ambulatory Visit (INDEPENDENT_AMBULATORY_CARE_PROVIDER_SITE_OTHER): Payer: Medicare Other | Admitting: Family Medicine

## 2013-12-22 ENCOUNTER — Encounter: Payer: Self-pay | Admitting: Obstetrics & Gynecology

## 2013-12-22 VITALS — BP 119/77 | HR 76 | Temp 97.6°F | Ht 66.0 in | Wt 147.2 lb

## 2013-12-22 DIAGNOSIS — N736 Female pelvic peritoneal adhesions (postinfective): Secondary | ICD-10-CM

## 2013-12-22 DIAGNOSIS — N809 Endometriosis, unspecified: Secondary | ICD-10-CM

## 2013-12-22 MED ORDER — KETOROLAC TROMETHAMINE 60 MG/2ML IM SOLN
60.0000 mg | Freq: Once | INTRAMUSCULAR | Status: AC
  Administered 2013-12-22: 60 mg via INTRAMUSCULAR

## 2013-12-22 NOTE — Progress Notes (Signed)
Pt. Comes in today for a lengthy discussion about her upcoming hysterectomy.  She continues to have issues with surgery.    She has questions about pain, recurrence of pain post-op, which cannot be assessed fully nor can absence of pain be guaranteed. Offered pt. A trial of Depo Lupron as a barometer of potential benefits of surgery.  Discussed indications for surgery, including pelvic adhesive disease, endometriosis, fibroid uterus.  Risks of not having surgery and continued medical treatment discussed. Risks of OCP's in a smoker after age 23 discussed. Including blood clots.--Less of a concern with HRT.  She has questions about further problems associated with removal of ovaries. Risks/benefits of ovarian removal discussed.  Risk of bleeding with surgery is increased. Immediate menopause, increased risk of all cause mortality, mood swings, night sweats, hot flashes, bone loss, cardioprotection all discussed. Discussed HRT and probable use of less hormone than OC's and in a transdermal form.  Complications of surgery discussed.  Risks of surgery include but are not limited to bleeding, infection, injury to surrounding structures, including bowel. Possible need for colostomy, removal of organs, injury of bladder and ureters, blood clots, and death.     Asked to return to room to discuss minimally invasive techniques for hysterectomy.  Given pelvic adhesive disease, would not recommend TLH. I do not do robotic hysterectomy and recommend open approach, if I am doing her surgery. She is more than welcome to seek a second opinion.  She agrees to proceed with planned surgery of TAH/BSO.

## 2013-12-22 NOTE — Patient Instructions (Signed)
Bilateral Salpingo-Oophorectomy Bilateral salpingo-oophorectomy is the surgical removal of both fallopian tubes and both ovaries. The ovaries are Wismer organs that produce eggs in women. The fallopian tubes transport the egg from the ovary to the womb (uterus). Usually, when this surgery is done, the uterus was previously removed. A bilateral salpingo-oophorectomy may be done to treat cancer or to reduce the risk of cancer in women who are at high risk. Removing both fallopian tubes and both ovaries will make you unable to become pregnant (sterile). It will also put you into menopause so that you will no longer have menstrual periods and may have menopausal symptoms such as hot flashes, night sweats, and mood changes. It will not affect your sex drive. LET [facility] CARE PROVIDER KNOW ABOUT:  Any allergies you have.  All medicines you are taking, including vitamins, herbs, eye drops, creams, and over-the-counter medicines.  Previous problems you or members of your family have had with the use of anesthetics.  Any blood disorders you have.  Previous surgeries you have had.  Medical conditions you have. RISKS AND COMPLICATIONS Generally, this is a safe procedure. However, as with any procedure, complications can occur. Possible complications include:  Injury to surrounding organs.  Bleeding.  Infection.  Blood clots in the legs or lungs.  Problems related to anesthesia. BEFORE THE PROCEDURE  Ask your health care provider about changing or stopping your regular medicines. You may need to stop taking certain medicines, such as aspirin or blood thinners, at least 1 week before the surgery.  Do not eat or drink anything for at least 8 hours before the surgery.  If you smoke, do not smoke for at least 2 weeks before the surgery.  Make plans to have someone drive you home after the procedure or after your hospital stay. Also arrange for someone to help you with activities during  recovery. PROCEDURE   You will be given medicine to help you relax before the procedure (sedative). You will then be given medicine to make you sleep through the procedure (general anesthetic). These medicines will be given through an IV access tube that is put into one of your veins.  Once you are asleep, your lower abdomen will be shaved and cleaned. A thin, flexible tube (catheter) will be placed in your bladder.  The surgeon may use a laparoscopic, robotic, or open technique for this surgery:  In the laparoscopic technique, the surgery is done through two Regnier cuts (incisions) in the abdomen. A thin, lighted tube with a tiny camera on the end (laparoscope) is inserted into one of the incisions. The tools needed for the procedure are put through the other incision.  A robotic technique may be chosen to perform complex surgery in a Yaw space. In the robotic technique, Ficek incisions will be made. A camera and surgical instruments are passed through the incisions. Surgical instruments will be controlled with the help of a robotic arm.  In the open technique, the surgery is done through one large incision in the abdomen.  Using any of these techniques, the surgeon removes the fallopian tubes and ovaries. The blood vessels will be clamped and tied.  The surgeon then uses staples or stitches to close the incision or incisions. AFTER THE PROCEDURE  You will be taken to a recovery area where you will be monitored for 1 to 3 hours. Your blood pressure, pulse, and temperature will be checked often. You will remain in the recovery area until you are stable and waking  up.  If the laparoscopic technique was used, you may be allowed to go home after several hours. You may have some shoulder pain after the laparoscopic procedure. This is normal and usually goes away in a day or two.  If the open technique was used, you will be admitted to the hospital for a couple of days.  You will be given pain  medicine as needed.  The IV access tube and catheter will be removed before you are discharged. Document Released: 06/05/2005 Document Revised: 06/10/2013 Document Reviewed: 11/27/2012 [facility] Patient Information 2015 [place], [place]. This information is not intended to replace advice given to you by your health care provider. Make sure you discuss any questions you have with your health care provider. Abdominal Hysterectomy Abdominal hysterectomy is a surgical procedure to remove your womb (uterus). Your uterus is the muscular organ that contains a developing baby. This surgery is done for many reasons. You may need an abdominal hysterectomy if you have cancer, growths (tumors), long-term pain, or bleeding. You may also have this procedure if your uterus has slipped down into your vagina (uterine prolapse). Depending on why you need an abdominal hysterectomy, you may also have other reproductive organs removed. These could include the part of your vagina that connects with your uterus (cervix), the organs that make eggs (ovaries), and the tubes that connect the ovaries to the uterus (fallopian tubes). LET [facility] CARE PROVIDER KNOW ABOUT:   Any allergies you have.  All medicines you are taking, including vitamins, herbs, eye drops, creams, and over-the-counter medicines.  Previous problems you or members of your family have had with the use of anesthetics.  Any blood disorders you have.  Previous surgeries you have had.  Medical conditions you have. RISKS AND COMPLICATIONS Generally, this is a safe procedure. However, as with any procedure, problems can occur. Infection is the most common problem after an abdominal hysterectomy. Other possible problems include:  Bleeding.  Formation of blood clots that may break free and travel to your lungs.  Injury to other organs near your uterus.  Nerve injury causing nerve pain.  Decreased interest in sex or pain during sexual  intercourse. BEFORE THE PROCEDURE  Abdominal hysterectomy is a major surgical procedure. It can affect the way you feel about yourself. Talk to your health care provider about the physical and emotional changes hysterectomy may cause.  You may need to have blood work and X-rays done before surgery.  Quit smoking if you smoke. Ask your health care provider for help if you are struggling to quit.  Stop taking medicines that thin your blood as directed by your health care provider.  You may be instructed to take antibiotic medicines or laxatives before surgery.  Do not eat or drink anything for 6-8 hours before surgery.  Take your regular medicines with a small sip of water.  Bathe or shower the night or morning before surgery. PROCEDURE  Abdominal hysterectomy is done in the operating room at the hospital.  In most cases, you will be given a medicine that makes you go to sleep (general anesthetic).  The surgeon will make a cut (incision) through the skin in your lower belly.  The incision may be about 5-7 inches long. It may go side-to-side or up-and-down.  The surgeon will move aside the body tissue that covers your uterus. The surgeon will then carefully take out your uterus along with any of your other reproductive organs that need to be removed.  Bleeding will be  controlled with clamps or sutures.  The surgeon will close your incision with sutures or metal clips. AFTER THE PROCEDURE  You will have some pain immediately after the procedure.  You will be given pain medicine in the recovery room.  You will be taken to your hospital room when you have recovered from the anesthesia.  You may need to stay in the hospital for 2-5 days.  You will be given instructions for recovery at home. Document Released: 06/10/2013 Document Reviewed: 03/28/2013 [facility] Patient Information 2015 [place], [place]. This information is not intended to replace advice given to you by your health  care provider. Make sure you discuss any questions you have with your health care provider.

## 2013-12-26 ENCOUNTER — Other Ambulatory Visit: Payer: Self-pay | Admitting: Family Medicine

## 2014-01-12 ENCOUNTER — Telehealth: Payer: Self-pay | Admitting: General Practice

## 2014-01-12 MED ORDER — OMEPRAZOLE 20 MG PO CPDR: DELAYED_RELEASE_CAPSULE | ORAL | Status: DC

## 2014-01-12 MED ORDER — IBUPROFEN 600 MG PO TABS: ORAL_TABLET | ORAL | Status: DC

## 2014-01-12 MED ORDER — ONDANSETRON 8 MG PO TBDP: 8.0000 mg | ORAL_TABLET | Freq: Three times a day (TID) | ORAL | Status: DC | PRN

## 2014-01-12 NOTE — Telephone Encounter (Signed)
Called patient, left message that prescription refills were sent to pharmacy.

## 2014-01-12 NOTE — Telephone Encounter (Signed)
Patient called and left message stating she needs a refill on her omeprazole, the nausea pills zofran, also needs the 600mg  Ibuprofen because the 800 is too strong during the day and also needs a refill on her birth control pills as well.

## 2014-01-15 NOTE — H&P (Signed)
Kristen [name] is an 33 y.o. G0P0 Unknown female.   Chief Complaint: Pelvic pain  HPI: She has a long h/o pelvic pain. Found to have large multi-nodular fibroid uterus with endometriosis, proven on biopsy. Also had pelvic adhesive disease with [name] noted at lparscopy.  Here for definitive treatment.   Past Medical History  Diagnosis Date  . Sickle cell trait   . Borderline diabetes     no meds  . Scoliosis   . Hernia, umbilical   . Depression   . Anxiety   . Panic attack   . OCD (obsessive compulsive disorder)   . Fibromyalgia   . Carpal tunnel syndrome on both sides   . Fibroid, uterine   . GERD (gastroesophageal reflux disease)   . Bipolar disorder   . Hx of bronchitis   . Headache(784.0)     otc meds prn  . Arthritis     lower back, hands, feet  . Anemia     history    Past Surgical History  Procedure Laterality Date  . Wisdom tooth extraction    . Back surgery      2 rods in back -scoliosis  . Laparoscopy N/A 10/29/2013    Procedure: LAPAROSCOPY DIAGNOSTIC;  Surgeon: Donnamae Jude, MD;  Location: [facility];  Service: Gynecology;  Laterality: N/A;    Family History  Problem Relation Age of Onset  . Hypertension Mother   . Diabetes Mother   . Fibromyalgia Mother   . Stroke Mother   . Cancer Maternal Grandmother    Social History:  reports that she has been smoking Cigarettes.  She has a 4.75 pack-year smoking history. She has never used smokeless tobacco. She reports that she drinks alcohol. She reports that she uses illicit drugs (Marijuana).  Allergies:  Allergies  Allergen Reactions  . Tramadol Hcl Hives and Itching    No current facility-administered medications on file prior to encounter.   Current Outpatient Prescriptions on File Prior to Encounter  Medication Sig Dispense Refill  . albuterol (PROVENTIL HFA;VENTOLIN HFA) 108 (90 BASE) MCG/ACT inhaler Inhale 1 puff into the lungs every 6 (six) hours as needed for wheezing or shortness of  breath.      . cyclobenzaprine (FLEXERIL) 5 MG tablet Take 1 tablet (5 mg total) by mouth 3 (three) times daily as needed for muscle spasms.  30 tablet  0  . mirtazapine (REMERON) 15 MG tablet Take 15 mg by mouth at bedtime.      . Norethindrone-Ethinyl Estradiol Biphasic (NECON 10/11, 28,) 0.5-35/1-35 MG-MCG tablet TAKE ACTIVE PILLS CONTINUOUSLY- DON'T TAKE THE PLACEBOS.  28 tablet  12  . oxyCODONE-acetaminophen (PERCOCET/ROXICET) 5-325 MG per tablet Take 1-2 tablets by mouth every 6 (six) hours as needed.  20 tablet  0  . pregabalin (LYRICA) 50 MG capsule Take 50 mg by mouth 2 (two) times daily.        Pertinent items are noted in HPI.  There were no vitals taken for this visit. There were no vitals taken for this visit. General appearance: alert, cooperative and appears stated age Head: Normocephalic, without obvious abnormality, atraumatic Neck: supple, symmetrical, trachea midline Lungs: normal effort Heart: regular rate and rhythm Abdomen: soft, tender over lower abdomen Extremities: extremities normal, atraumatic, no cyanosis or edema Skin: Skin color, texture, turgor normal. No rashes or lesions Neurologic: Grossly normal   Lab Results  Component Value Date   WBC 7.1 10/28/2013   HGB 11.7* 10/28/2013   HCT 33.2* 10/28/2013  MCV 88.1 10/28/2013   PLT 280 10/28/2013   Lab Results  Component Value Date   PREGTESTUR NEGATIVE 10/29/2013     Assessment/Plan Patient Active Problem List   Diagnosis Date Noted  . Pelvic pain in female 09/18/2013    Priority: Medium  . FIBROIDS, UTERUS 07/13/2009    Priority: Medium  . Endometriosis 11/20/2013  . Infertility of tubal origin 10/29/2013  . Pelvic adhesive disease 10/29/2013  . Dermoid cyst of left ovary 11/15/2011  . DEPRESSION 09/07/2010  . BIPOLAR DISORDER UNSPECIFIED 07/13/2009  . ABDOMINAL WALL HERNIA 06/17/2009  . TREMOR 06/17/2009  . ACUTE CYSTITIS 03/08/2009  . SICKLE CELL TRAIT 03/04/2009  . NECK PAIN 03/04/2009   . ABDOMINAL PAIN, GENERALIZED 03/04/2009  . HYPERGLYCEMIA 12/11/2008  . SCOLIOSIS , IDIOPATHIC 11/24/2008  . CHRONIC PAIN SYNDROME 11/09/2008  . INSOMNIA 11/09/2008  . ARTHRALGIA 10/30/2008   For TAHBSO Risks include but are not limited to bleeding, infection, injury to surrounding structures, including bowel, bladder and ureters, blood clots, and death.  Likelihood of success is moderate. Risks/benefits of ovarian removal discussed.  Risk of bleeding with surgery is increased. Immediate menopause, increased risk of all cause mortality, mood swings, night sweats, hot flashes, bone loss, cardioprotection all discussed.  Pt. Desires removal of ovaries.  Likelihood of success of surgery in ending pelvic pain is roughly 60%.   Carilyn Woolston S 01/15/2014, 9:03 AM

## 2014-01-28 ENCOUNTER — Other Ambulatory Visit: Payer: Self-pay | Admitting: Family Medicine

## 2014-02-05 ENCOUNTER — Encounter (HOSPITAL_COMMUNITY)
Admission: RE | Admit: 2014-02-05 | Discharge: 2014-02-05 | Disposition: A | Discharge status: Home / Self Care | Payer: Medicare Other | Source: Ambulatory Visit | Attending: Family Medicine | Admitting: Family Medicine

## 2014-02-05 ENCOUNTER — Encounter (HOSPITAL_COMMUNITY): Payer: Self-pay

## 2014-02-05 DIAGNOSIS — Z01818 Encounter for other preprocedural examination: Secondary | ICD-10-CM | POA: Diagnosis present

## 2014-02-05 DIAGNOSIS — D259 Leiomyoma of uterus, unspecified: Secondary | ICD-10-CM | POA: Insufficient documentation

## 2014-02-05 DIAGNOSIS — N949 Unspecified condition associated with female genital organs and menstrual cycle: Secondary | ICD-10-CM | POA: Insufficient documentation

## 2014-02-05 DIAGNOSIS — N803 Endometriosis of pelvic peritoneum, unspecified: Secondary | ICD-10-CM | POA: Insufficient documentation

## 2014-02-05 DIAGNOSIS — N856 Intrauterine synechiae: Secondary | ICD-10-CM | POA: Insufficient documentation

## 2014-02-05 HISTORY — DX: Umbilical hernia without obstruction or gangrene: K42.9

## 2014-02-05 LAB — CBC
HCT: 33 % — ABNORMAL LOW (ref 36.0–46.0)
Hemoglobin: 11.7 g/dL — ABNORMAL LOW (ref 12.0–15.0)
MCH: 31 pg (ref 26.0–34.0)
MCHC: 35.5 g/dL (ref 30.0–36.0)
MCV: 87.5 fL (ref 78.0–100.0)
PLATELETS: 312 10*3/uL (ref 150–400)
RBC: 3.77 MIL/uL — ABNORMAL LOW (ref 3.87–5.11)
RDW: 13 % (ref 11.5–15.5)
WBC: 7.3 10*3/uL (ref 4.0–10.5)

## 2014-02-05 LAB — BASIC METABOLIC PANEL
ANION GAP: 10 (ref 5–15)
BUN: 13 mg/dL (ref 6–23)
CALCIUM: 9.3 mg/dL (ref 8.4–10.5)
CO2: 25 mEq/L (ref 19–32)
CREATININE: 0.93 mg/dL (ref 0.50–1.10)
Chloride: 101 mEq/L (ref 96–112)
GFR calc Af Amer: >90 mL/min (ref >90)
GFR calc non Af Amer: 80 mL/min — ABNORMAL LOW (ref >90)
Glucose, Bld: 116 mg/dL — ABNORMAL HIGH (ref 70–99)
Potassium: 4.2 mEq/L (ref 3.7–5.3)
Sodium: 136 mEq/L — ABNORMAL LOW (ref 137–147)

## 2014-02-05 NOTE — Patient Instructions (Signed)
20 Kristen [name]  02/05/2014   Your procedure is scheduled on:  02/10/14  Enter through the Main Entrance of [facility] [place] up the phone at the desk and dial [phone].   Call this number if you have problems the morning of surgery: [phone]   Remember:   Do not eat food:After Midnight.  Do not drink clear liquids: After Midnight.  Take these medicines the morning of surgery with A SIP OF WATER: Lyrica, Prilosec, bring inhaler if possible   Do not wear jewelry, make-up or nail polish.  Do not wear lotions, powders, or perfumes. You may wear deodorant.  Do not shave 48 hours prior to surgery.  Do not bring valuables to the hospital.  [facility] is not   responsible for any belongings or valuables brought to the hospital.  Contacts, dentures or bridgework may not be worn into surgery.  Leave suitcase in the car. After surgery it may be brought to your room.  For patients admitted to the hospital, checkout time is 11:00 AM the day of              discharge.   Patients discharged the day of surgery will not be allowed to drive             home.  Name and phone number of your driver: NA  Special Instructions:      Please read over the following fact sheets that you were given:   Surgical Site Infection Prevention

## 2014-02-09 ENCOUNTER — Other Ambulatory Visit: Payer: Self-pay | Admitting: Family Medicine

## 2014-02-10 ENCOUNTER — Inpatient Hospital Stay (HOSPITAL_COMMUNITY): Payer: Medicare Other | Admitting: Certified Registered Nurse Anesthetist

## 2014-02-10 ENCOUNTER — Encounter (HOSPITAL_COMMUNITY)
Admission: RE | Disposition: A | Discharge status: Home / Self Care | Payer: Self-pay | Source: Ambulatory Visit | Attending: Family Medicine

## 2014-02-10 ENCOUNTER — Encounter (HOSPITAL_COMMUNITY): Payer: Self-pay | Admitting: Registered Nurse

## 2014-02-10 ENCOUNTER — Inpatient Hospital Stay (HOSPITAL_COMMUNITY)
Admission: RE | Admit: 2014-02-10 | Discharge: 2014-02-12 | DRG: 742 | Disposition: A | Discharge status: Home / Self Care | Payer: Medicare Other | Source: Ambulatory Visit | Attending: Family Medicine | Admitting: Family Medicine

## 2014-02-10 ENCOUNTER — Encounter (HOSPITAL_COMMUNITY): Payer: Medicare Other | Admitting: Certified Registered Nurse Anesthetist

## 2014-02-10 DIAGNOSIS — M4120 Other idiopathic scoliosis, site unspecified: Secondary | ICD-10-CM | POA: Diagnosis present

## 2014-02-10 DIAGNOSIS — D252 Subserosal leiomyoma of uterus: Secondary | ICD-10-CM | POA: Diagnosis present

## 2014-02-10 DIAGNOSIS — D251 Intramural leiomyoma of uterus: Secondary | ICD-10-CM | POA: Diagnosis present

## 2014-02-10 DIAGNOSIS — K439 Ventral hernia without obstruction or gangrene: Secondary | ICD-10-CM

## 2014-02-10 DIAGNOSIS — D573 Sickle-cell trait: Secondary | ICD-10-CM | POA: Diagnosis present

## 2014-02-10 DIAGNOSIS — Z833 Family history of diabetes mellitus: Secondary | ICD-10-CM | POA: Diagnosis not present

## 2014-02-10 DIAGNOSIS — K219 Gastro-esophageal reflux disease without esophagitis: Secondary | ICD-10-CM | POA: Diagnosis present

## 2014-02-10 DIAGNOSIS — D259 Leiomyoma of uterus, unspecified: Secondary | ICD-10-CM | POA: Diagnosis present

## 2014-02-10 DIAGNOSIS — Z8249 Family history of ischemic heart disease and other diseases of the circulatory system: Secondary | ICD-10-CM

## 2014-02-10 DIAGNOSIS — N8300 Follicular cyst of ovary, unspecified side: Secondary | ICD-10-CM | POA: Diagnosis present

## 2014-02-10 DIAGNOSIS — N840 Polyp of corpus uteri: Secondary | ICD-10-CM | POA: Diagnosis present

## 2014-02-10 DIAGNOSIS — N760 Acute vaginitis: Secondary | ICD-10-CM

## 2014-02-10 DIAGNOSIS — A5485 Gonococcal peritonitis: Secondary | ICD-10-CM | POA: Diagnosis present

## 2014-02-10 DIAGNOSIS — N80109 Endometriosis of ovary, unspecified side, unspecified depth: Secondary | ICD-10-CM | POA: Diagnosis present

## 2014-02-10 DIAGNOSIS — N736 Female pelvic peritoneal adhesions (postinfective): Secondary | ICD-10-CM

## 2014-02-10 DIAGNOSIS — D250 Submucous leiomyoma of uterus: Secondary | ICD-10-CM

## 2014-02-10 DIAGNOSIS — G8929 Other chronic pain: Secondary | ICD-10-CM | POA: Diagnosis present

## 2014-02-10 DIAGNOSIS — Z823 Family history of stroke: Secondary | ICD-10-CM | POA: Diagnosis not present

## 2014-02-10 DIAGNOSIS — N809 Endometriosis, unspecified: Secondary | ICD-10-CM

## 2014-02-10 DIAGNOSIS — IMO0001 Reserved for inherently not codable concepts without codable children: Secondary | ICD-10-CM | POA: Diagnosis present

## 2014-02-10 DIAGNOSIS — N801 Endometriosis of ovary: Secondary | ICD-10-CM | POA: Diagnosis present

## 2014-02-10 DIAGNOSIS — N949 Unspecified condition associated with female genital organs and menstrual cycle: Principal | ICD-10-CM

## 2014-02-10 DIAGNOSIS — F17200 Nicotine dependence, unspecified, uncomplicated: Secondary | ICD-10-CM | POA: Diagnosis present

## 2014-02-10 HISTORY — PX: SALPINGOOPHORECTOMY: SHX82

## 2014-02-10 HISTORY — PX: ABDOMINAL HYSTERECTOMY: SHX81

## 2014-02-10 HISTORY — PX: UMBILICAL HERNIA REPAIR: SHX196

## 2014-02-10 LAB — PREGNANCY, URINE: Preg Test, Ur: NEGATIVE

## 2014-02-10 SURGERY — HYSTERECTOMY, ABDOMINAL
Anesthesia: General | Site: Abdomen

## 2014-02-10 MED ORDER — GLYCOPYRROLATE 0.2 MG/ML IJ SOLN
INTRAMUSCULAR | Status: DC | PRN
  Administered 2014-02-10: 0.6 mg via INTRAVENOUS

## 2014-02-10 MED ORDER — SODIUM CHLORIDE 0.9 % IJ SOLN: 9.0000 mL | INTRAMUSCULAR | Status: DC | PRN

## 2014-02-10 MED ORDER — CYCLOBENZAPRINE HCL 5 MG PO TABS
5.0000 mg | ORAL_TABLET | Freq: Three times a day (TID) | ORAL | Status: DC | PRN
  Filled 2014-02-10: qty 1

## 2014-02-10 MED ORDER — HYDROMORPHONE HCL PF 1 MG/ML IJ SOLN
INTRAMUSCULAR | Status: DC | PRN
  Administered 2014-02-10: 1 mg via INTRAVENOUS

## 2014-02-10 MED ORDER — ONDANSETRON HCL 4 MG/2ML IJ SOLN
4.0000 mg | Freq: Four times a day (QID) | INTRAMUSCULAR | Status: DC | PRN
  Filled 2014-02-10: qty 2

## 2014-02-10 MED ORDER — DIPHENHYDRAMINE HCL 12.5 MG/5ML PO ELIX: 12.5000 mg | ORAL_SOLUTION | Freq: Four times a day (QID) | ORAL | Status: DC | PRN

## 2014-02-10 MED ORDER — KETOROLAC TROMETHAMINE 30 MG/ML IJ SOLN: 30.0000 mg | Freq: Once | INTRAMUSCULAR | Status: DC

## 2014-02-10 MED ORDER — PROPOFOL 10 MG/ML IV EMUL
INTRAVENOUS | Status: AC
  Filled 2014-02-10: qty 20

## 2014-02-10 MED ORDER — LIDOCAINE HCL (CARDIAC) 20 MG/ML IV SOLN
INTRAVENOUS | Status: AC
  Filled 2014-02-10: qty 5

## 2014-02-10 MED ORDER — MIDAZOLAM HCL 2 MG/2ML IJ SOLN
INTRAMUSCULAR | Status: AC
  Filled 2014-02-10: qty 2

## 2014-02-10 MED ORDER — KETOROLAC TROMETHAMINE 30 MG/ML IJ SOLN
30.0000 mg | Freq: Four times a day (QID) | INTRAMUSCULAR | Status: DC
Start: 2014-02-10 — End: 2014-02-12

## 2014-02-10 MED ORDER — LACTATED RINGERS IV SOLN
INTRAVENOUS | Status: DC
  Administered 2014-02-10 (×3): via INTRAVENOUS

## 2014-02-10 MED ORDER — CEFAZOLIN SODIUM-DEXTROSE 2-3 GM-% IV SOLR
INTRAVENOUS | Status: AC
  Filled 2014-02-10: qty 50

## 2014-02-10 MED ORDER — DEXAMETHASONE SODIUM PHOSPHATE 4 MG/ML IJ SOLN
INTRAMUSCULAR | Status: DC | PRN
  Administered 2014-02-10: 4 mg via INTRAVENOUS

## 2014-02-10 MED ORDER — HYDROMORPHONE 0.3 MG/ML IV SOLN
INTRAVENOUS | Status: DC
  Administered 2014-02-10: 0.2 mg via INTRAVENOUS
  Administered 2014-02-10: 15:00:00 via INTRAVENOUS
  Administered 2014-02-10: 1.79 mg via INTRAVENOUS
  Administered 2014-02-11: 0.799 mg via INTRAVENOUS
  Administered 2014-02-11: 0.8 mg via INTRAVENOUS
  Filled 2014-02-10: qty 25

## 2014-02-10 MED ORDER — ONDANSETRON HCL 4 MG/2ML IJ SOLN
4.0000 mg | Freq: Four times a day (QID) | INTRAMUSCULAR | Status: DC | PRN
  Administered 2014-02-10: 4 mg via INTRAVENOUS

## 2014-02-10 MED ORDER — ONDANSETRON HCL 4 MG/2ML IJ SOLN
INTRAMUSCULAR | Status: AC
  Filled 2014-02-10: qty 2

## 2014-02-10 MED ORDER — CEFAZOLIN SODIUM-DEXTROSE 2-3 GM-% IV SOLR
2.0000 g | INTRAVENOUS | Status: AC
  Administered 2014-02-10: 2 g via INTRAVENOUS

## 2014-02-10 MED ORDER — DEXAMETHASONE SODIUM PHOSPHATE 4 MG/ML IJ SOLN
INTRAMUSCULAR | Status: AC
  Filled 2014-02-10: qty 1

## 2014-02-10 MED ORDER — ROCURONIUM BROMIDE 100 MG/10ML IV SOLN
INTRAVENOUS | Status: DC | PRN
  Administered 2014-02-10: 50 mg via INTRAVENOUS
  Administered 2014-02-10 (×2): 10 mg via INTRAVENOUS

## 2014-02-10 MED ORDER — ONDANSETRON HCL 4 MG/2ML IJ SOLN
INTRAMUSCULAR | Status: DC | PRN
  Administered 2014-02-10: 4 mg via INTRAVENOUS

## 2014-02-10 MED ORDER — NEOSTIGMINE METHYLSULFATE 10 MG/10ML IV SOLN
INTRAVENOUS | Status: DC | PRN
  Administered 2014-02-10: 4 mg via INTRAVENOUS

## 2014-02-10 MED ORDER — MENTHOL 3 MG MT LOZG: 1.0000 | LOZENGE | OROMUCOSAL | Status: DC | PRN

## 2014-02-10 MED ORDER — DIPHENHYDRAMINE HCL 50 MG/ML IJ SOLN: 12.5000 mg | Freq: Four times a day (QID) | INTRAMUSCULAR | Status: DC | PRN

## 2014-02-10 MED ORDER — ONDANSETRON 8 MG PO TBDP
8.0000 mg | ORAL_TABLET | Freq: Three times a day (TID) | ORAL | Status: DC | PRN
  Filled 2014-02-10: qty 1

## 2014-02-10 MED ORDER — HYDROMORPHONE HCL PF 1 MG/ML IJ SOLN
INTRAMUSCULAR | Status: AC
  Filled 2014-02-10: qty 1

## 2014-02-10 MED ORDER — IBUPROFEN 600 MG PO TABS
600.0000 mg | ORAL_TABLET | Freq: Four times a day (QID) | ORAL | Status: DC | PRN
  Administered 2014-02-11 – 2014-02-12 (×3): 600 mg via ORAL
  Filled 2014-02-10 (×3): qty 1

## 2014-02-10 MED ORDER — LIDOCAINE HCL (CARDIAC) 20 MG/ML IV SOLN
INTRAVENOUS | Status: DC | PRN
Start: 2014-02-10 — End: 2014-02-10
  Administered 2014-02-10: 50 mg via INTRAVENOUS

## 2014-02-10 MED ORDER — FENTANYL CITRATE 0.05 MG/ML IJ SOLN
INTRAMUSCULAR | Status: DC | PRN
  Administered 2014-02-10: 50 ug via INTRAVENOUS
  Administered 2014-02-10: 150 ug via INTRAVENOUS
  Administered 2014-02-10 (×3): 50 ug via INTRAVENOUS
  Administered 2014-02-10: 100 ug via INTRAVENOUS
  Administered 2014-02-10: 50 ug via INTRAVENOUS

## 2014-02-10 MED ORDER — MIRTAZAPINE 15 MG PO TABS
15.0000 mg | ORAL_TABLET | Freq: Every day | ORAL | Status: DC
  Administered 2014-02-10 – 2014-02-11 (×2): 15 mg via ORAL
  Filled 2014-02-10 (×3): qty 1

## 2014-02-10 MED ORDER — KETOROLAC TROMETHAMINE 30 MG/ML IJ SOLN
30.0000 mg | Freq: Four times a day (QID) | INTRAMUSCULAR | Status: DC
Start: 2014-02-10 — End: 2014-02-12
  Administered 2014-02-10 – 2014-02-11 (×3): 30 mg via INTRAVENOUS
  Filled 2014-02-10 (×3): qty 1

## 2014-02-10 MED ORDER — SCOPOLAMINE 1 MG/3DAYS TD PT72
MEDICATED_PATCH | TRANSDERMAL | Status: AC
  Administered 2014-02-10: 1.5 mg via TRANSDERMAL
  Filled 2014-02-10: qty 1

## 2014-02-10 MED ORDER — HYDROMORPHONE HCL PF 1 MG/ML IJ SOLN
0.2500 mg | INTRAMUSCULAR | Status: DC | PRN
Start: 2014-02-10 — End: 2014-02-10
  Administered 2014-02-10: 0.5 mg via INTRAVENOUS
  Administered 2014-02-10 (×2): 0.25 mg via INTRAVENOUS

## 2014-02-10 MED ORDER — GLYCOPYRROLATE 0.2 MG/ML IJ SOLN
INTRAMUSCULAR | Status: AC
  Filled 2014-02-10: qty 3

## 2014-02-10 MED ORDER — NALOXONE HCL 0.4 MG/ML IJ SOLN: 0.4000 mg | INTRAMUSCULAR | Status: DC | PRN

## 2014-02-10 MED ORDER — PANTOPRAZOLE SODIUM 40 MG PO TBEC
40.0000 mg | DELAYED_RELEASE_TABLET | Freq: Every day | ORAL | Status: DC
  Administered 2014-02-11 – 2014-02-12 (×2): 40 mg via ORAL
  Filled 2014-02-10 (×2): qty 1

## 2014-02-10 MED ORDER — PREGABALIN 25 MG PO CAPS
75.0000 mg | ORAL_CAPSULE | Freq: Two times a day (BID) | ORAL | Status: DC
  Administered 2014-02-10 – 2014-02-11 (×2): 75 mg via ORAL
  Filled 2014-02-10 (×2): qty 3

## 2014-02-10 MED ORDER — SCOPOLAMINE 1 MG/3DAYS TD PT72
1.0000 | MEDICATED_PATCH | Freq: Once | TRANSDERMAL | Status: DC
  Administered 2014-02-10: 1.5 mg via TRANSDERMAL

## 2014-02-10 MED ORDER — MIDAZOLAM HCL 5 MG/5ML IJ SOLN
INTRAMUSCULAR | Status: DC | PRN
  Administered 2014-02-10: 2 mg via INTRAVENOUS

## 2014-02-10 MED ORDER — DEXTROSE IN LACTATED RINGERS 5 % IV SOLN
INTRAVENOUS | Status: DC
  Administered 2014-02-10 – 2014-02-11 (×3): via INTRAVENOUS

## 2014-02-10 MED ORDER — BUPIVACAINE HCL (PF) 0.25 % IJ SOLN
INTRAMUSCULAR | Status: DC | PRN
  Administered 2014-02-10: 30 mL

## 2014-02-10 MED ORDER — ROCURONIUM BROMIDE 100 MG/10ML IV SOLN
INTRAVENOUS | Status: AC
  Filled 2014-02-10: qty 1

## 2014-02-10 MED ORDER — OXYCODONE-ACETAMINOPHEN 5-325 MG PO TABS
1.0000 | ORAL_TABLET | ORAL | Status: DC | PRN
  Administered 2014-02-11 – 2014-02-12 (×8): 2 via ORAL
  Filled 2014-02-10 (×8): qty 2

## 2014-02-10 MED ORDER — PROPOFOL 10 MG/ML IV BOLUS
INTRAVENOUS | Status: DC | PRN
  Administered 2014-02-10: 200 mg via INTRAVENOUS

## 2014-02-10 MED ORDER — ALBUTEROL SULFATE (2.5 MG/3ML) 0.083% IN NEBU: 2.5000 mg | INHALATION_SOLUTION | Freq: Four times a day (QID) | RESPIRATORY_TRACT | Status: DC | PRN

## 2014-02-10 MED ORDER — LACTATED RINGERS IV SOLN: INTRAVENOUS | Status: DC

## 2014-02-10 MED ORDER — BUPIVACAINE HCL (PF) 0.25 % IJ SOLN
INTRAMUSCULAR | Status: AC
  Filled 2014-02-10: qty 30

## 2014-02-10 MED ORDER — KETOROLAC TROMETHAMINE 30 MG/ML IJ SOLN
INTRAMUSCULAR | Status: AC
  Filled 2014-02-10: qty 1

## 2014-02-10 MED ORDER — 0.9 % SODIUM CHLORIDE (POUR BTL) OPTIME
TOPICAL | Status: DC | PRN
  Administered 2014-02-10: 1000 mL

## 2014-02-10 MED ORDER — FENTANYL 10 MCG/ML IV SOLN: INTRAVENOUS | Status: DC

## 2014-02-10 MED ORDER — FENTANYL CITRATE 0.05 MG/ML IJ SOLN
INTRAMUSCULAR | Status: AC
  Filled 2014-02-10: qty 5

## 2014-02-10 MED ORDER — KETOROLAC TROMETHAMINE 30 MG/ML IJ SOLN
INTRAMUSCULAR | Status: DC | PRN
  Administered 2014-02-10: 30 mg via INTRAVENOUS

## 2014-02-10 SURGICAL SUPPLY — 49 items
APL SKNCLS STERI-STRIP NONHPOA (GAUZE/BANDAGES/DRESSINGS) ×2
BARRIER ADHS 3X4 INTERCEED (GAUZE/BANDAGES/DRESSINGS) IMPLANT
BENZOIN TINCTURE PRP APPL 2/3 (GAUZE/BANDAGES/DRESSINGS) ×2 IMPLANT
BLADE SURG 10 STRL SS (BLADE) ×8 IMPLANT
BRR ADH 4X3 ABS CNTRL BYND (GAUZE/BANDAGES/DRESSINGS)
CANISTER SUCT 3000ML (MISCELLANEOUS) ×4 IMPLANT
CELLS DAT CNTRL 66122 CELL SVR (MISCELLANEOUS) IMPLANT
CLOSURE WOUND 1/2 X4 (GAUZE/BANDAGES/DRESSINGS) ×1
CLOTH BEACON ORANGE TIMEOUT ST (SAFETY) ×4 IMPLANT
CONT PATH 16OZ SNAP LID 3702 (MISCELLANEOUS) ×4 IMPLANT
DECANTER SPIKE VIAL GLASS SM (MISCELLANEOUS) ×2 IMPLANT
DRAPE WARM FLUID 44X44 (DRAPE) ×2 IMPLANT
DRSG OPSITE POSTOP 4X10 (GAUZE/BANDAGES/DRESSINGS) ×4 IMPLANT
DURAPREP 26ML APPLICATOR (WOUND CARE) ×4 IMPLANT
GAUZE SPONGE 4X4 16PLY XRAY LF (GAUZE/BANDAGES/DRESSINGS) ×4 IMPLANT
GLOVE BIOGEL PI IND STRL 7.0 (GLOVE) ×6 IMPLANT
GLOVE BIOGEL PI INDICATOR 7.0 (GLOVE) ×6
GLOVE ECLIPSE 7.0 STRL STRAW (GLOVE) ×8 IMPLANT
GOWN STRL REUS W/TWL LRG LVL3 (GOWN DISPOSABLE) ×12 IMPLANT
NDL HYPO 25X1 1.5 SAFETY (NEEDLE) ×2 IMPLANT
NEEDLE HYPO 25X1 1.5 SAFETY (NEEDLE) ×4 IMPLANT
NS IRRIG 1000ML POUR BTL (IV SOLUTION) ×4 IMPLANT
PACK ABDOMINAL GYN (CUSTOM PROCEDURE TRAY) ×4 IMPLANT
PAD OB MATERNITY 4.3X12.25 (PERSONAL CARE ITEMS) ×4 IMPLANT
PROTECTOR NERVE ULNAR (MISCELLANEOUS) ×4 IMPLANT
RETRACTOR WND ALEXIS 18 MED (MISCELLANEOUS) IMPLANT
RETRACTOR WND ALEXIS 25 LRG (MISCELLANEOUS) IMPLANT
RTRCTR WOUND ALEXIS 18CM MED (MISCELLANEOUS)
RTRCTR WOUND ALEXIS 25CM LRG (MISCELLANEOUS)
SPONGE LAP 18X18 X RAY DECT (DISPOSABLE) ×6 IMPLANT
STAPLER VISISTAT 35W (STAPLE) ×4 IMPLANT
STRIP CLOSURE SKIN 1/2X4 (GAUZE/BANDAGES/DRESSINGS) ×1 IMPLANT
SUT PROLENE 0 CT 1 30 (SUTURE) ×4 IMPLANT
SUT VIC AB 0 CT1 18XCR BRD8 (SUTURE) ×6 IMPLANT
SUT VIC AB 0 CT1 27 (SUTURE) ×16
SUT VIC AB 0 CT1 27XBRD ANBCTR (SUTURE) ×8 IMPLANT
SUT VIC AB 0 CT1 8-18 (SUTURE) ×8
SUT VIC AB 1 CTX 36 (SUTURE) ×8
SUT VIC AB 1 CTX36XBRD ANBCTRL (SUTURE) ×4 IMPLANT
SUT VIC AB 3-0 SH 27 (SUTURE)
SUT VIC AB 3-0 SH 27X BRD (SUTURE) IMPLANT
SUT VIC AB 4-0 KS 27 (SUTURE) ×2 IMPLANT
SUT VICRYL 0 TIES 12 18 (SUTURE) ×4 IMPLANT
SYRINGE CONTROL L 12CC (SYRINGE) ×4 IMPLANT
SYRINGE CONTROL LL 12CC (SYRINGE) ×2 IMPLANT
TOWEL OR 17X24 6PK STRL BLUE (TOWEL DISPOSABLE) ×8 IMPLANT
TRAY FOLEY CATH 14FR (SET/KITS/TRAYS/PACK) ×4 IMPLANT
WATER STERILE IRR 1000ML POUR (IV SOLUTION) ×2 IMPLANT
YANKAUER SUCT BULB TIP NO VENT (SUCTIONS) ×2 IMPLANT

## 2014-02-10 NOTE — Anesthesia Postprocedure Evaluation (Signed)
  Anesthesia Post-op Note  Patient: Kristen [name]  Procedure(s) Performed: Procedure(s): HYSTERECTOMY ABDOMINAL (N/A) SALPINGO OOPHORECTOMY (Bilateral) HERNIA REPAIR UMBILICAL ADULT (N/A) Patient is awake and responsive. Pain and nausea are reasonably well controlled. Vital signs are stable and clinically acceptable. Oxygen saturation is clinically acceptable. There are no apparent anesthetic complications at this time. Patient is ready for discharge.

## 2014-02-10 NOTE — Anesthesia Preprocedure Evaluation (Signed)
Anesthesia Evaluation  Patient identified by MRN, date of birth, ID band Patient awake    Reviewed: Allergy & Precautions, H&P , Patient's Chart, lab work & pertinent test results, reviewed documented beta blocker date and time   Airway Mallampati: II TM Distance: >3 FB Neck ROM: full    Dental no notable dental hx.    Pulmonary asthma (Chest clear, no inhaler x 3 month) , Current Smoker,  breath sounds clear to auscultation  Pulmonary exam normal       Cardiovascular Rhythm:regular Rate:Normal     Neuro/Psych PSYCHIATRIC DISORDERS    GI/Hepatic GERD-  Medicated and Controlled,  Endo/Other    Renal/GU      Musculoskeletal   Abdominal   Peds  Hematology   Anesthesia Other Findings   Reproductive/Obstetrics                           Anesthesia Physical Anesthesia Plan  ASA: II  Anesthesia Plan: General   Post-op Pain Management:    Induction: Intravenous  Airway Management Planned: Oral ETT  Additional Equipment:   Intra-op Plan:   Post-operative Plan: Extubation in OR  Informed Consent: I have reviewed the patients History and Physical, chart, labs and discussed the procedure including the risks, benefits and alternatives for the proposed anesthesia with the patient or authorized representative who has indicated his/her understanding and acceptance.   Dental Advisory Given and Dental advisory given  Plan Discussed with: CRNA and Surgeon  Anesthesia Plan Comments: (  Discussed general anesthesia, including possible nausea, instrumentation of airway, sore throat,pulmonary aspiration, etc. I asked if the were any outstanding questions, or  concerns before we proceeded. )        Anesthesia Quick Evaluation

## 2014-02-10 NOTE — Progress Notes (Signed)
UR completed 

## 2014-02-10 NOTE — Op Note (Signed)
Preoperative diagnosis: Fbroid uterus, chronic pelvic pain, endometriosis, ventral hernia  Postoperative diagnosis: Same  Procedure: Total abdominal hysterectomy, bilateral salpingo-oophorectomy, ventral hernia repair  Surgeon: Standley Dakins. Kennon Rounds, M.D.  Asst.: Lavonia Drafts, MD  Anesthesia: Lucita Lora, MD  Findings: Fbroid uterus, adhesions of tubs and ovaries to pelvic sidewalls, small 1 cm ventral hernia  Estimated blood loss: 250 cc  Specimen: Uterus and tubes to pathology  Reason for procedure: 33 y.o. G0P0 with history of fibroids, chronic pelvic pain, endometriosis and long standing infertility. The patient desired definitive treatment.  Risks of repeat hysterectomy reviewed.  Risks include but are not limited to bleeding, infection, injury to surrounding structures, including bowel, bladder and ureters, blood clots, and death.  Procedure:  She was taken to the operating room and placed in a supine position. She received 2 gm Ancef and had SCD's in place. Her abdomen and vagina were prepped and draped in the usual sterile fashion. A Foley catheter was placed which drained clear urine throughout the case. After adequate anesthesia was assured a transverse incision was made approximately 2 cm above her symphysis pubis. The incision was carried down through the subcutaneous tissue to the fascia. Bleeding encountered was cauterized with the Bovie. The fascia was scored the midline and the fascial incision was extended bilaterally. The fascia was dissected off the underlying rectus superiorly and inferiorly. The rectus muscles were separated in a transverse fashion. Hemostasis was maintained. The peritoneum was entered with hemostats and the peritoneal incision was extended bilaterally bluntly, taking care to avoid bowel and bladder. The patient was placed in Trendelenburg position and her bowel was packed out of the operative field. The pelvis was inspected. A Balfour retractor was  inserted and the bladder blade applied. Kelley clamps were used to elevate the uterus. The round ligaments were identified clamped, cut and ligated. A bladder flap was created anteriorly and the bladder was pushed out of the operative site with a moist lap sponge. The infundibulopelvic ligaments were identified bilaterally. They were clamped, cut, and doubly ligated with removal of tubes in this process. The uterine vessels were clamped, cut and ligated. The remainder of the cervix was separated from its pelvic attachments using the same clamp, cut, ligate technique until the cervix was encountered. A knife was used to enter the vagina and Jergensen scissors to remove the specimen.  The vaginal cuff was closed and was noted to be hemostatic. The pelvis was irrigated with 1 L of warm normal saline. All pedicles were noted to be hemostatic. The tubes were adherent and the ovaries had to be dissected off the pelvic sidewalls.  They were clamed, cut and doubly ligated and the tubes and ovaries were removed. The sponges were removed. Attention turned to there hernia.  It was 12 cm above the incision, midline and above the umbilicus.  As the patient really wanted it fixed a small incision of 1.25 cm was used and Kocher clamps used to grasp the fascia.  The fascia was closed with Prolene in a figure of eight. The rectus muscles were inspected and hemostasis was assured. The fascia was closed with a 0 Vicryl running nonlocking suture. The subcutaneous tissue was irrigated, clean, dry. It was then infiltrated with 30 mL of 0.5% Marcaine. A skin was closed with done with 4-0 vicryl on a keith needle. Steri-strips and sterile dressing applied. She tolerated the procedure well and was taken to the recovery room in stable condition. Her Foley catheter drained clear urine throughout.  Donnamae Jude MD 10/20/2013, 10:54 AM

## 2014-02-10 NOTE — Transfer of Care (Signed)
Immediate Anesthesia Transfer of Care Note  Patient: Kristen [name]  Procedure(s) Performed: Procedure(s): HYSTERECTOMY ABDOMINAL (N/A) SALPINGO OOPHORECTOMY (Bilateral) HERNIA REPAIR UMBILICAL ADULT (N/A)  Patient Location: PACU  Anesthesia Type:General  Level of Consciousness: awake, alert  and oriented  Airway & Oxygen Therapy: Patient Spontanous Breathing and Patient connected to nasal cannula oxygen  Post-op Assessment: Report given to PACU RN, Post -op Vital signs reviewed and stable and Patient moving all extremities  Post vital signs: Reviewed and stable  Complications: No apparent anesthesia complications

## 2014-02-10 NOTE — Interval H&P Note (Signed)
History and Physical Interval Note:  02/10/2014 9:01 AM  Kristen [name]  has presented today for surgery, with the diagnosis of cpt 58150 - Large multi-nodular fibroid uterus with endometriosis and long history of pelvic pain  The various methods of treatment have been discussed with the patient and family. After consideration of risks, benefits and other options for treatment, the patient has consented to  Procedure(s) with comments: HYSTERECTOMY ABDOMINAL (N/A) - Hysterectomy with BSO and possible umbilical hernia repair as a surgical intervention .  The patient's history has been reviewed, patient examined, no change in status, stable for surgery.  I have reviewed the patient's chart and labs.  Questions were answered to the patient's satisfaction.     Gays

## 2014-02-11 ENCOUNTER — Encounter (HOSPITAL_COMMUNITY): Payer: Self-pay | Admitting: Family Medicine

## 2014-02-11 LAB — CBC
HCT: 27.4 % — ABNORMAL LOW (ref 36.0–46.0)
Hemoglobin: 9.5 g/dL — ABNORMAL LOW (ref 12.0–15.0)
MCH: 30.5 pg (ref 26.0–34.0)
MCHC: 34.7 g/dL (ref 30.0–36.0)
MCV: 88.1 fL (ref 78.0–100.0)
PLATELETS: 240 10*3/uL (ref 150–400)
RBC: 3.11 MIL/uL — ABNORMAL LOW (ref 3.87–5.11)
RDW: 12.9 % (ref 11.5–15.5)
WBC: 10.9 10*3/uL — ABNORMAL HIGH (ref 4.0–10.5)

## 2014-02-11 MED ORDER — PREGABALIN 50 MG PO CAPS
75.0000 mg | ORAL_CAPSULE | Freq: Two times a day (BID) | ORAL | Status: DC
  Administered 2014-02-11 – 2014-02-12 (×2): 75 mg via ORAL
  Filled 2014-02-11 (×4): qty 1

## 2014-02-11 NOTE — Progress Notes (Signed)
1936 - Pt displaying hostile behavior towards nursing staff and MD provider requesting pain med. Stating she was supposed to receive pain med at 1850 and did not get it. Pain med administered to pt at 1940 and will continue to monitor

## 2014-02-11 NOTE — Progress Notes (Signed)
1 Day Post-Op Procedure(s) (LRB): HYSTERECTOMY ABDOMINAL (N/A) SALPINGO OOPHORECTOMY (Bilateral) HERNIA REPAIR UMBILICAL ADULT (N/A)  Subjective: Patient reports incisional pain and tolerating PO.    Objective: I have reviewed patient's vital signs, intake and output, medications and labs.  General: alert, cooperative and appears stated age Resp: normal effort Cardio: regular rate and rhythm GI: incision: clean, dry, intact and bandage in place Extremities: extremities normal, atraumatic, no cyanosis or edema  Assessment: s/p Procedure(s): HYSTERECTOMY ABDOMINAL (N/A) SALPINGO OOPHORECTOMY (Bilateral) HERNIA REPAIR UMBILICAL ADULT (N/A): stable  Plan: Advance diet Encourage ambulation Discontinue IV fluids  LOS: 1 day    PRATT,TANYA S 02/11/2014, 10:10 AM

## 2014-02-11 NOTE — Addendum Note (Signed)
Addendum created 02/11/14 0850 by Tobin Chad, CRNA   Modules edited: Notes Section   Notes Section:  File: [id number]

## 2014-02-11 NOTE — Anesthesia Postprocedure Evaluation (Signed)
  Anesthesia Post-op Note  Patient: Kristen [name]  Procedure(s) Performed: Procedure(s): HYSTERECTOMY ABDOMINAL (N/A) SALPINGO OOPHORECTOMY (Bilateral) HERNIA REPAIR UMBILICAL ADULT (N/A)  Patient Location: [facility]  Anesthesia Type:General  Level of Consciousness: awake, alert , oriented and patient cooperative  Airway and Oxygen Therapy: Patient Spontanous Breathing  Post-op Pain: none  Post-op Assessment: Patient's Cardiovascular Status Stable and Respiratory Function Stable  Post-op Vital Signs: Reviewed and stable  Last Vitals:  Filed Vitals:   02/11/14 0539  BP: 110/66  Pulse: 61  Temp: 36.8 C  Resp: 15    Complications: No apparent anesthesia complications

## 2014-02-12 MED ORDER — IBUPROFEN 600 MG PO TABS
ORAL_TABLET | ORAL | Status: DC
Start: 2014-02-12 — End: 2014-08-24

## 2014-02-12 MED ORDER — OXYCODONE-ACETAMINOPHEN 5-325 MG PO TABS: 1.0000 | ORAL_TABLET | Freq: Four times a day (QID) | ORAL | Status: DC | PRN

## 2014-02-12 NOTE — Discharge Instructions (Signed)
Abdominal Hysterectomy Abdominal hysterectomy is a surgical procedure to remove your womb (uterus). Your uterus is the muscular organ that contains a developing baby. This surgery is done for many reasons. You may need an abdominal hysterectomy if you have cancer, growths (tumors), long-term pain, or bleeding. You may also have this procedure if your uterus has slipped down into your vagina (uterine prolapse). Depending on why you need an abdominal hysterectomy, you may also have other reproductive organs removed. These could include the part of your vagina that connects with your uterus (cervix), the organs that make eggs (ovaries), and the tubes that connect the ovaries to the uterus (fallopian tubes). LET [facility] CARE PROVIDER KNOW ABOUT:   Any allergies you have.  All medicines you are taking, including vitamins, herbs, eye drops, creams, and over-the-counter medicines.  Previous problems you or members of your family have had with the use of anesthetics.  Any blood disorders you have.  Previous surgeries you have had.  Medical conditions you have. RISKS AND COMPLICATIONS Generally, this is a safe procedure. However, as with any procedure, problems can occur. Infection is the most common problem after an abdominal hysterectomy. Other possible problems include:  Bleeding.  Formation of blood clots that may break free and travel to your lungs.  Injury to other organs near your uterus.  Nerve injury causing nerve pain.  Decreased interest in sex or pain during sexual intercourse. BEFORE THE PROCEDURE  Abdominal hysterectomy is a major surgical procedure. It can affect the way you feel about yourself. Talk to your health care provider about the physical and emotional changes hysterectomy may cause.  You may need to have blood work and X-rays done before surgery.  Quit smoking if you smoke. Ask your health care provider for help if you are struggling to quit.  Stop taking  medicines that thin your blood as directed by your health care provider.  You may be instructed to take antibiotic medicines or laxatives before surgery.  Do not eat or drink anything for 6-8 hours before surgery.  Take your regular medicines with a small sip of water.  Bathe or shower the night or morning before surgery. PROCEDURE  Abdominal hysterectomy is done in the operating room at the hospital.  In most cases, you will be given a medicine that makes you go to sleep (general anesthetic).  The surgeon will make a cut (incision) through the skin in your lower belly.  The incision may be about 5-7 inches long. It may go side-to-side or up-and-down.  The surgeon will move aside the body tissue that covers your uterus. The surgeon will then carefully take out your uterus along with any of your other reproductive organs that need to be removed.  Bleeding will be controlled with clamps or sutures.  The surgeon will close your incision with sutures or metal clips. AFTER THE PROCEDURE  You will have some pain immediately after the procedure.  You will be given pain medicine in the recovery room.  You will be taken to your hospital room when you have recovered from the anesthesia.  You may need to stay in the hospital for 2-5 days.  You will be given instructions for recovery at home. Document Released: 06/10/2013 Document Reviewed: 06/10/2013 [facility] Patient Information 2015 [place], [place]. This information is not intended to replace advice given to you by your health care provider. Make sure you discuss any questions you have with your health care provider.  Endometriosis Endometriosis is a condition in  which the tissue that lines the uterus (endometrium) grows outside of its normal location. The tissue may grow in many locations close to the uterus, but it commonly grows on the ovaries, fallopian tubes, vagina, or bowel. Because the uterus expels, or sheds, its lining every  menstrual cycle, there is bleeding wherever the endometrial tissue is located. This can cause pain because blood is irritating to tissues not normally exposed to it.  CAUSES  The cause of endometriosis is not known.  SIGNS AND SYMPTOMS  Often, there are no symptoms. When symptoms are present, they can vary with the location of the displaced tissue. Various symptoms can occur at different times. Although symptoms occur mainly during a woman's menstrual period, they can also occur midcycle and usually stop with menopause. Some people may go months with no symptoms at all. Symptoms may include:   Back or abdominal pain.   Heavier bleeding during periods.   Pain during intercourse.   Painful bowel movements.   Infertility. DIAGNOSIS  Your health care provider will do a physical exam and ask about your symptoms. Various tests may be done, such as:   Blood tests and urine tests. These are done to help rule out other problems.   Ultrasound. This test is done to look for abnormal tissue.   An X-ray of the lower bowel (barium enema).  Laparoscopy. In this procedure, a thin, lighted tube with a tiny camera on the end (laparoscope) is inserted into your abdomen. This helps your health care provider look for abnormal tissue to confirm the diagnosis. The health care provider may also remove a small piece of tissue (biopsy) from any abnormal tissue found. This tissue sample can then be sent to a lab so it can be looked at under a microscope. TREATMENT  Treatment will vary and may include:   Medicines to relieve pain. Nonsteroidal anti-inflammatory drugs (NSAIDs) are a type of pain medicine that can help to relieve the pain caused by endometriosis.  Hormonal therapy. When using hormonal therapy, periods are eliminated. This eliminates the monthly exposure to blood by the displaced endometrial tissue.   Surgery. Surgery may sometimes be done to remove the abnormal endometrial tissue. In  severe cases, surgery may be done to remove the fallopian tubes, uterus, and ovaries (hysterectomy). HOME CARE INSTRUCTIONS   Take all medicines as directed by your health care provider. Do not take aspirin because it may increase bleeding when you are not on hormonal therapy.   Avoid activities that produce pain, including sexual activity. SEEK MEDICAL CARE IF:  You have pelvic pain before, after, or during your periods.  You have pelvic pain between periods that gets worse during your period.  You have pelvic pain during or after sex.  You have pelvic pain with bowel movements or urination, especially during your period.  You have problems getting pregnant.  You have a fever. SEEK IMMEDIATE MEDICAL CARE IF:   Your pain is severe and is not responding to pain medicine.   You have severe nausea and vomiting, or you cannot keep foods down.   You have pain that is limited to the right lower part of your abdomen.   You have swelling or increasing pain in your abdomen.   You see blood in your stool.  MAKE SURE YOU:   Understand these instructions.  Will watch your condition.  Will get help right away if you are not doing well or get worse. Document Released: 06/02/2000 Document Revised: 10/20/2013 Document Reviewed: 01/31/2013  [employer]® Patient Information ©2015 [employer]. This information is not intended to replace advice given to you by your health care provider. Make sure you discuss any questions you have with your health care provider. ° °

## 2014-02-12 NOTE — Discharge Summary (Signed)
Gynecology Physician Discharge Summary  Patient ID: Kristen [name] MRN: [id number] DOB/AGE: 33-Jul-1982 33 y.o.  Admit date: 02/10/2014 Discharge date: 02/12/2014  Preoperative Diagnoses:  Patient Active Problem List   Diagnosis Date Noted  . Pelvic pain in female 09/18/2013    Priority: Medium  . FIBROIDS, UTERUS 07/13/2009    Priority: Medium  . Fibroid uterus 02/10/2014  . Endometriosis 11/20/2013  . Infertility of tubal origin 10/29/2013  . Pelvic adhesive disease 10/29/2013  . Dermoid cyst of left ovary 11/15/2011  . DEPRESSION 09/07/2010  . BIPOLAR DISORDER UNSPECIFIED 07/13/2009  . ABDOMINAL WALL HERNIA 06/17/2009  . TREMOR 06/17/2009  . ACUTE CYSTITIS 03/08/2009  . SICKLE CELL TRAIT 03/04/2009  . NECK PAIN 03/04/2009  . ABDOMINAL PAIN, GENERALIZED 03/04/2009  . HYPERGLYCEMIA 12/11/2008  . SCOLIOSIS , IDIOPATHIC 11/24/2008  . CHRONIC PAIN SYNDROME 11/09/2008  . INSOMNIA 11/09/2008  . ARTHRALGIA 10/30/2008     Procedures: Procedure(s) (LRB): HYSTERECTOMY ABDOMINAL (N/A) SALPINGO OOPHORECTOMY (Bilateral) HERNIA REPAIR UMBILICAL ADULT (N/A)  CBC Latest Ref Rng 02/11/2014 02/05/2014 10/28/2013  WBC 4.0 - 10.5 K/uL 10.9(H) 7.3 7.1  Hemoglobin 12.0 - 15.0 g/dL 9.5(L) 11.7(L) 11.7(L)  Hematocrit 36.0 - 46.0 % 27.4(L) 33.0(L) 33.2(L)  Platelets 150 - 400 K/uL 240 312 280    Hospital Course:  Kristen [name] is a 33 y.o. G0P0  admitted for scheduled surgery.  She underwent the procedures as mentioned above, her operation was uncomplicated. For further details about surgery, please refer to the operative report. Patient had an uncomplicated postoperative course. By time of discharge on POD#2, her pain was controlled on oral pain medications; she was ambulating, voiding without difficulty, tolerating regular diet and passing flatus. She was deemed stable for discharge to home.   Discharge Exam: Blood pressure 104/57, pulse 63, temperature 98.4 F (36.9 C), temperature  source Oral, resp. rate 16, height 5' 6.5" (1.689 m), weight 150 lb 12.8 oz (68.402 kg), SpO2 98.00%. General appearance: alert and no distress  Resp: normal effort Cardio: RRR  GI: soft, non-tender; bowel sounds normal; no masses, no organomegaly.  Incisions: C/D/I, no erythema, no drainage noted Extremities: extremities normal, atraumatic, no cyanosis or edema and Homans sign is negative, no sign of DVT  Discharged Condition: Stable  Disposition: 01-Home or Self Care  Discharge Instructions   Call MD for:  difficulty breathing, headache or visual disturbances    Complete by:  As directed      Call MD for:  persistant nausea and vomiting    Complete by:  As directed      Call MD for:  redness, tenderness, or signs of infection (pain, swelling, redness, odor or green/yellow discharge around incision site)    Complete by:  As directed      Call MD for:  severe uncontrolled pain    Complete by:  As directed      Call MD for:  temperature >100.4    Complete by:  As directed      Diet - low sodium heart healthy    Complete by:  As directed      Discharge wound care:    Complete by:  As directed   Leave dressing on x 7 days, unless falls off sooner, then leave uncovered     Increase activity slowly    Complete by:  As directed      Lifting restrictions    Complete by:  As directed   Nothing > 20 lbs x 6-8 wks  May shower / Bathe    Complete by:  As directed      Sexual Activity Restrictions    Complete by:  As directed   None x 4 wks            Medication List    STOP taking these medications       NECON 10/11 (28) 0.5-35/1-35 MG-MCG tablet  Generic drug:  Norethindrone-Ethinyl Estradiol Biphasic     Norethindrone-Ethinyl Estradiol Biphasic 0.5-35/1-35 MG-MCG tablet  Commonly known as:  NECON 10/11 (28)      TAKE these medications       albuterol 108 (90 BASE) MCG/ACT inhaler  Commonly known as:  PROVENTIL HFA;VENTOLIN HFA  Inhale 1 puff into the lungs every 6  (six) hours as needed for wheezing or shortness of breath.     cyclobenzaprine 5 MG tablet  Commonly known as:  FLEXERIL  Take 1 tablet (5 mg total) by mouth 3 (three) times daily as needed for muscle spasms.     ibuprofen 600 MG tablet  Commonly known as:  ADVIL,MOTRIN  TAKE 1 TABLET (600 MG TOTAL) BY MOUTH EVERY 6 (SIX) HOURS AS NEEDED FOR PAIN.     mirtazapine 15 MG tablet  Commonly known as:  REMERON  Take 15 mg by mouth at bedtime.     omeprazole 20 MG capsule  Commonly known as:  PRILOSEC  TAKE 1 CAPSULE EVERY DAY     ondansetron 8 MG disintegrating tablet  Commonly known as:  ZOFRAN ODT  Take 1 tablet (8 mg total) by mouth every 8 (eight) hours as needed for nausea.     oxyCODONE-acetaminophen 5-325 MG per tablet  Commonly known as:  PERCOCET/ROXICET  Take 1-2 tablets by mouth every 6 (six) hours as needed.     pregabalin 75 MG capsule  Commonly known as:  LYRICA  Take 75 mg by mouth 2 (two) times daily.           Follow-up Information   Follow up with [facility] In 2 weeks.   Specialty:  Obstetrics and Gynecology   Contact information:   [place] [address] [phone]      Signed:  Donnamae Jude 02/12/2014 8:52 AM

## 2014-02-12 NOTE — Progress Notes (Signed)
Pt ambulated out teaching complete  

## 2014-02-13 ENCOUNTER — Telehealth: Payer: Self-pay

## 2014-02-13 NOTE — Telephone Encounter (Signed)
Called [facility] and [facility]-- patient has medicare and medicaid. [employer] states because patient has medicaid, if she chose to use that as well, she will need to be referred from PCP on card. [facility] also states because of patient's medicaid she will need to be referred from PCP [facility] listed on medicaid card). Called patient and informed her due to insurance she will need to get referral from [facility]. Patient verbalized understanding and stated she was in bed, could not get out,and did not have a pen on her nor does she remember things well. Requested that we mail her the information to these offices and what I have explained to her. Informed patient I could sent her a letter of our discussion and office information but reiterated that she will need to have appointment with [facility] to be referred. Patient verbalized understanding. No further questions or concerns. Letter sent.

## 2014-02-13 NOTE — Telephone Encounter (Signed)
Message copied by Geanie Logan on Fri Feb 13, 2014 11:10 AM ------      Message from: Kristen [name]      Created: Thu Feb 12, 2014  8:49 AM       This pt. Needs a podiatry referral for possible ganglion on left foot and referral to Derm for alopecia arreata.--Please schedule and call pt. With appointment dates and times. ------

## 2014-02-24 ENCOUNTER — Telehealth: Payer: Self-pay | Admitting: *Deleted

## 2014-02-24 NOTE — Telephone Encounter (Signed)
Pt called nurse line stating she out of oxycodone mediation.    Contacted patient and patient states she is doing better and is taking Motrin.  Pt states if she gets worse she will come to [facility].

## 2014-03-05 ENCOUNTER — Ambulatory Visit (INDEPENDENT_AMBULATORY_CARE_PROVIDER_SITE_OTHER): Payer: Self-pay | Admitting: Family Medicine

## 2014-03-05 ENCOUNTER — Encounter: Payer: Self-pay | Admitting: *Deleted

## 2014-03-05 ENCOUNTER — Other Ambulatory Visit: Payer: Self-pay | Admitting: Family Medicine

## 2014-03-05 ENCOUNTER — Encounter: Payer: Self-pay | Admitting: Family Medicine

## 2014-03-05 VITALS — BP 112/62 | HR 75 | Temp 98.2°F | Ht 66.5 in | Wt 154.7 lb

## 2014-03-05 DIAGNOSIS — Z09 Encounter for follow-up examination after completed treatment for conditions other than malignant neoplasm: Secondary | ICD-10-CM

## 2014-03-05 NOTE — Progress Notes (Signed)
    Subjective:    Patient ID: Kristen [name] is a 33 y.o. female presenting with Routine Post Op  on 03/05/2014  HPI: Doing well.  She is having pain from incision but actually feels other pain is gone.  Review of Systems  Constitutional: Negative for fever and chills.  Respiratory: Negative for shortness of breath.   Cardiovascular: Negative for chest pain.  Gastrointestinal: Negative for nausea, vomiting and abdominal pain.  Genitourinary: Negative for dysuria.  Skin: Negative for rash.      Objective:    BP 112/62  Pulse 75  Temp(Src) 98.2 F (36.8 C) (Oral)  Ht 5' 6.5" (1.689 m)  Wt 154 lb 11.2 oz (70.171 kg)  BMI 24.60 kg/m2  LMP 12/31/2012 Physical Exam  Constitutional: She is oriented to person, place, and time. She appears well-developed and well-nourished. No distress.  HENT:  Head: Normocephalic and atraumatic.  Eyes: No scleral icterus.  Neck: Neck supple.  Cardiovascular: Normal rate.   Pulmonary/Chest: Effort normal.  Abdominal: Soft.  Neurological: She is alert and oriented to person, place, and time.  Skin: Skin is warm and dry.  Incision is well healed  Psychiatric: She has a normal mood and affect.        Assessment & Plan:  Post op visit--healing well  Rtn in 4 wks for f/u and release to normal activity. Will need to begin HRT with transdermal estrogen.

## 2014-03-24 ENCOUNTER — Other Ambulatory Visit: Payer: Self-pay | Admitting: Family Medicine

## 2014-04-20 ENCOUNTER — Other Ambulatory Visit: Payer: Self-pay | Admitting: Family Medicine

## 2014-06-25 ENCOUNTER — Encounter (HOSPITAL_COMMUNITY): Payer: Self-pay | Admitting: *Deleted

## 2014-06-25 ENCOUNTER — Emergency Department (HOSPITAL_COMMUNITY): Payer: Medicare Other

## 2014-06-25 ENCOUNTER — Emergency Department (HOSPITAL_COMMUNITY)
Admission: EM | Admit: 2014-06-25 | Discharge: 2014-06-25 | Disposition: A | Discharge status: Home / Self Care | Payer: Medicare Other | Attending: Emergency Medicine | Admitting: Emergency Medicine

## 2014-06-25 DIAGNOSIS — Y9389 Activity, other specified: Secondary | ICD-10-CM | POA: Insufficient documentation

## 2014-06-25 DIAGNOSIS — M47816 Spondylosis without myelopathy or radiculopathy, lumbar region: Secondary | ICD-10-CM | POA: Diagnosis not present

## 2014-06-25 DIAGNOSIS — M797 Fibromyalgia: Secondary | ICD-10-CM | POA: Diagnosis not present

## 2014-06-25 DIAGNOSIS — Z862 Personal history of diseases of the blood and blood-forming organs and certain disorders involving the immune mechanism: Secondary | ICD-10-CM | POA: Diagnosis not present

## 2014-06-25 DIAGNOSIS — R52 Pain, unspecified: Secondary | ICD-10-CM

## 2014-06-25 DIAGNOSIS — S199XXA Unspecified injury of neck, initial encounter: Secondary | ICD-10-CM | POA: Diagnosis present

## 2014-06-25 DIAGNOSIS — M19042 Primary osteoarthritis, left hand: Secondary | ICD-10-CM | POA: Diagnosis not present

## 2014-06-25 DIAGNOSIS — S99912A Unspecified injury of left ankle, initial encounter: Secondary | ICD-10-CM | POA: Diagnosis not present

## 2014-06-25 DIAGNOSIS — S8991XA Unspecified injury of right lower leg, initial encounter: Secondary | ICD-10-CM | POA: Insufficient documentation

## 2014-06-25 DIAGNOSIS — Y92410 Unspecified street and highway as the place of occurrence of the external cause: Secondary | ICD-10-CM | POA: Diagnosis not present

## 2014-06-25 DIAGNOSIS — S99921A Unspecified injury of right foot, initial encounter: Secondary | ICD-10-CM | POA: Insufficient documentation

## 2014-06-25 DIAGNOSIS — Z79899 Other long term (current) drug therapy: Secondary | ICD-10-CM | POA: Insufficient documentation

## 2014-06-25 DIAGNOSIS — Z720 Tobacco use: Secondary | ICD-10-CM | POA: Insufficient documentation

## 2014-06-25 DIAGNOSIS — F329 Major depressive disorder, single episode, unspecified: Secondary | ICD-10-CM | POA: Insufficient documentation

## 2014-06-25 DIAGNOSIS — S99922A Unspecified injury of left foot, initial encounter: Secondary | ICD-10-CM | POA: Insufficient documentation

## 2014-06-25 DIAGNOSIS — S8992XA Unspecified injury of left lower leg, initial encounter: Secondary | ICD-10-CM | POA: Insufficient documentation

## 2014-06-25 DIAGNOSIS — Z8719 Personal history of other diseases of the digestive system: Secondary | ICD-10-CM | POA: Diagnosis not present

## 2014-06-25 DIAGNOSIS — K219 Gastro-esophageal reflux disease without esophagitis: Secondary | ICD-10-CM | POA: Insufficient documentation

## 2014-06-25 DIAGNOSIS — S299XXA Unspecified injury of thorax, initial encounter: Secondary | ICD-10-CM | POA: Insufficient documentation

## 2014-06-25 DIAGNOSIS — M19079 Primary osteoarthritis, unspecified ankle and foot: Secondary | ICD-10-CM | POA: Diagnosis not present

## 2014-06-25 DIAGNOSIS — Z8742 Personal history of other diseases of the female genital tract: Secondary | ICD-10-CM | POA: Insufficient documentation

## 2014-06-25 DIAGNOSIS — S99911A Unspecified injury of right ankle, initial encounter: Secondary | ICD-10-CM | POA: Insufficient documentation

## 2014-06-25 DIAGNOSIS — Y998 Other external cause status: Secondary | ICD-10-CM | POA: Diagnosis not present

## 2014-06-25 DIAGNOSIS — M19041 Primary osteoarthritis, right hand: Secondary | ICD-10-CM | POA: Insufficient documentation

## 2014-06-25 DIAGNOSIS — F419 Anxiety disorder, unspecified: Secondary | ICD-10-CM | POA: Insufficient documentation

## 2014-06-25 MED ORDER — HYDROCODONE-ACETAMINOPHEN 5-325 MG PO TABS: 2.0000 | ORAL_TABLET | ORAL | Status: DC | PRN

## 2014-06-25 MED ORDER — HYDROCODONE-ACETAMINOPHEN 5-325 MG PO TABS
2.0000 | ORAL_TABLET | Freq: Once | ORAL | Status: AC
  Administered 2014-06-25: 2 via ORAL
  Filled 2014-06-25: qty 2

## 2014-06-25 MED ORDER — HYDROCODONE-ACETAMINOPHEN 5-325 MG PO TABS: 1.0000 | ORAL_TABLET | Freq: Four times a day (QID) | ORAL | Status: DC | PRN
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
## 2014-06-25 NOTE — ED Notes (Addendum)
Pt now c/o headache and R eye pain.  Dr Winfred Leeds notified.
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
## 2014-06-25 NOTE — ED Notes (Signed)
Patient taken to toilet in a wheelchair.

## 2014-06-25 NOTE — ED Notes (Addendum)
Agricultural consultant at bedside.  Pt upset and called [facility]'s office.  Pt states she hates [facility] because her nephew and grandmother died here.
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
## 2014-06-25 NOTE — ED Notes (Signed)
Kristen [name] came in.  Dr Cathleen Fears assessed her immediately before she went into the room.  All her vitals were normal.  The paramedics transferred her to the bed.  Sanda Klein and I helped her undress, gowned her and took her vitals.  Nothing was out of the ordinary.  The Kristen [name] was not treated poorly in any way.

## 2014-06-30 ENCOUNTER — Telehealth: Payer: Self-pay
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
## 2014-07-02 ENCOUNTER — Ambulatory Visit: Payer: Medicare Other | Admitting: Obstetrics and Gynecology

## 2014-07-03 ENCOUNTER — Encounter: Payer: Self-pay | Admitting: Obstetrics and Gynecology

## 2014-07-03 ENCOUNTER — Ambulatory Visit: Payer: Medicare Other | Admitting: Obstetrics and Gynecology

## 2014-07-03 VITALS — BP 175/141 | Temp 98.6°F | Ht 66.5 in | Wt 156.7 lb

## 2014-07-03 DIAGNOSIS — Z9889 Other specified postprocedural states: Secondary | ICD-10-CM

## 2014-07-03 MED ORDER — MEDROXYPROGESTERONE ACETATE 10 MG PO TABS: 10.0000 mg | ORAL_TABLET | Freq: Every day | ORAL | Status: DC

## 2014-07-03 MED ORDER — ESTRADIOL 0.5 MG PO TABS: 0.5000 mg | ORAL_TABLET | Freq: Every day | ORAL | Status: DC
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
## 2014-07-05 ENCOUNTER — Emergency Department (HOSPITAL_COMMUNITY): Payer: Medicare Other

## 2014-07-05 ENCOUNTER — Encounter (HOSPITAL_COMMUNITY): Payer: Self-pay | Admitting: Emergency Medicine

## 2014-07-05 ENCOUNTER — Emergency Department (HOSPITAL_COMMUNITY)
Admission: EM | Admit: 2014-07-05 | Discharge: 2014-07-05 | Disposition: A | Discharge status: Home / Self Care | Payer: Medicare Other | Attending: Emergency Medicine | Admitting: Emergency Medicine

## 2014-07-05 DIAGNOSIS — Z862 Personal history of diseases of the blood and blood-forming organs and certain disorders involving the immune mechanism: Secondary | ICD-10-CM | POA: Insufficient documentation

## 2014-07-05 DIAGNOSIS — Z8742 Personal history of other diseases of the female genital tract: Secondary | ICD-10-CM | POA: Diagnosis not present

## 2014-07-05 DIAGNOSIS — Z7952 Long term (current) use of systemic steroids: Secondary | ICD-10-CM | POA: Insufficient documentation

## 2014-07-05 DIAGNOSIS — R51 Headache: Secondary | ICD-10-CM | POA: Insufficient documentation

## 2014-07-05 DIAGNOSIS — Z8669 Personal history of other diseases of the nervous system and sense organs: Secondary | ICD-10-CM | POA: Insufficient documentation

## 2014-07-05 DIAGNOSIS — M549 Dorsalgia, unspecified: Secondary | ICD-10-CM | POA: Diagnosis present

## 2014-07-05 DIAGNOSIS — M1389 Other specified arthritis, multiple sites: Secondary | ICD-10-CM | POA: Insufficient documentation

## 2014-07-05 DIAGNOSIS — F329 Major depressive disorder, single episode, unspecified: Secondary | ICD-10-CM | POA: Diagnosis not present

## 2014-07-05 DIAGNOSIS — Z720 Tobacco use: Secondary | ICD-10-CM | POA: Insufficient documentation

## 2014-07-05 DIAGNOSIS — M797 Fibromyalgia: Secondary | ICD-10-CM | POA: Insufficient documentation

## 2014-07-05 DIAGNOSIS — R519 Headache, unspecified: Secondary | ICD-10-CM

## 2014-07-05 DIAGNOSIS — Z793 Long term (current) use of hormonal contraceptives: Secondary | ICD-10-CM | POA: Diagnosis not present

## 2014-07-05 DIAGNOSIS — K219 Gastro-esophageal reflux disease without esophagitis: Secondary | ICD-10-CM | POA: Insufficient documentation

## 2014-07-05 DIAGNOSIS — F419 Anxiety disorder, unspecified: Secondary | ICD-10-CM | POA: Diagnosis not present

## 2014-07-05 DIAGNOSIS — G8911 Acute pain due to trauma: Secondary | ICD-10-CM | POA: Diagnosis not present

## 2014-07-05 DIAGNOSIS — Z8709 Personal history of other diseases of the respiratory system: Secondary | ICD-10-CM | POA: Insufficient documentation

## 2014-07-05 DIAGNOSIS — M542 Cervicalgia: Secondary | ICD-10-CM | POA: Insufficient documentation

## 2014-07-05 DIAGNOSIS — R52 Pain, unspecified: Secondary | ICD-10-CM

## 2014-07-05 DIAGNOSIS — Z79899 Other long term (current) drug therapy: Secondary | ICD-10-CM | POA: Diagnosis not present

## 2014-07-05 MED ORDER — LORAZEPAM 1 MG PO TABS: 1.0000 mg | ORAL_TABLET | Freq: Three times a day (TID) | ORAL | Status: DC | PRN

## 2014-07-05 MED ORDER — LORAZEPAM 1 MG PO TABS
1.0000 mg | ORAL_TABLET | Freq: Once | ORAL | Status: AC
  Administered 2014-07-05: 1 mg via ORAL
  Filled 2014-07-05: qty 1

## 2014-07-05 MED ORDER — MORPHINE SULFATE 4 MG/ML IJ SOLN
4.0000 mg | Freq: Once | INTRAMUSCULAR | Status: AC
  Administered 2014-07-05: 4 mg via INTRAMUSCULAR
  Filled 2014-07-05: qty 1

## 2014-07-05 MED ORDER — ONDANSETRON 4 MG PO TBDP
4.0000 mg | ORAL_TABLET | Freq: Once | ORAL | Status: AC
  Administered 2014-07-05: 4 mg via ORAL
  Filled 2014-07-05: qty 1

## 2014-07-05 NOTE — Discharge Instructions (Signed)
Back Pain, Adult Low back pain is very common. About 1 in 5 people have back pain.The cause of low back pain is rarely dangerous. The pain often gets better over time.About half of people with a sudden onset of back pain feel better in just 2 weeks. About 8 in 10 people feel better by 6 weeks.  CAUSES Some common causes of back pain include:  Strain of the muscles or ligaments supporting the spine.  Wear and tear (degeneration) of the spinal discs.  Arthritis.  Direct injury to the back. DIAGNOSIS Most of the time, the direct cause of low back pain is not known.However, back pain can be treated effectively even when the exact cause of the pain is unknown.Answering your caregiver's questions about your overall health and symptoms is one of the most accurate ways to make sure the cause of your pain is not dangerous. If your caregiver needs more information, he or she may order lab work or imaging tests (X-rays or MRIs).However, even if imaging tests show changes in your back, this usually does not require surgery. HOME CARE INSTRUCTIONS For many people, back pain returns.Since low back pain is rarely dangerous, it is often a condition that people can learn to [facility] their own.   Remain active. It is stressful on the back to sit or stand in one place. Do not sit, drive, or stand in one place for more than 30 minutes at a time. Take short walks on level surfaces as soon as pain allows.Try to increase the length of time you walk each day.  Do not stay in bed.Resting more than 1 or 2 days can delay your recovery.  Do not avoid exercise or work.Your body is made to move.It is not dangerous to be active, even though your back may hurt.Your back will likely heal faster if you return to being active before your pain is gone.  Pay attention to your body when you bend and lift. Many people have less discomfortwhen lifting if they bend their knees, keep the load close to their bodies,and  avoid twisting. Often, the most comfortable positions are those that put less stress on your recovering back.  Find a comfortable position to sleep. Use a firm mattress and lie on your side with your knees slightly bent. If you lie on your back, put a pillow under your knees.  Only take over-the-counter or prescription medicines as directed by your caregiver. Over-the-counter medicines to reduce pain and inflammation are often the most helpful.Your caregiver may prescribe muscle relaxant drugs.These medicines help dull your pain so you can more quickly return to your normal activities and healthy exercise.  Put ice on the injured area.  Put ice in a plastic bag.  Place a towel between your skin and the bag.  Leave the ice on for 15-20 minutes, 03-04 times a day for the first 2 to 3 days. After that, ice and heat may be alternated to reduce pain and spasms.  Ask your caregiver about trying back exercises and gentle massage. This may be of some benefit.  Avoid feeling anxious or stressed.Stress increases muscle tension and can worsen back pain.It is important to recognize when you are anxious or stressed and learn ways to manage it.Exercise is a great option. SEEK MEDICAL CARE IF:  You have pain that is not relieved with rest or medicine.  You have pain that does not improve in 1 week.  You have new symptoms.  You are generally not feeling well. SEEK  IMMEDIATE MEDICAL CARE IF:   You have pain that radiates from your back into your legs.  You develop new bowel or bladder control problems.  You have unusual weakness or numbness in your arms or legs.  You develop nausea or vomiting.  You develop abdominal pain.  You feel faint. Document Released: 06/05/2005 Document Revised: 12/05/2011 Document Reviewed: 10/07/2013 [facility] Patient Information 2015 [place], [place]. This information is not intended to replace advice given to you by your health care provider. Make sure you  discuss any questions you have with your health care provider. Chronic Pain Chronic pain can be defined as pain that is off and on and lasts for 3-6 months or longer. Many things cause chronic pain, which can make it difficult to make a diagnosis. There are many treatment options available for chronic pain. However, finding a treatment that works well for you may require trying various approaches until the right one is found. Many people benefit from a combination of two or more types of treatment to control their pain. SYMPTOMS  Chronic pain can occur anywhere in the body and can range from mild to very severe. Some types of chronic pain include:  Headache.  Low back pain.  Cancer pain.  Arthritis pain.  Neurogenic pain. This is pain resulting from damage to nerves. People with chronic pain may also have other symptoms such as:  Depression.  Anger.  Insomnia.  Anxiety. DIAGNOSIS  Your health care provider will help diagnose your condition over time. In many cases, the initial focus will be on excluding possible conditions that could be causing the pain. Depending on your symptoms, your health care provider may order tests to diagnose your condition. Some of these tests may include:   Blood tests.   CT scan.   MRI.   X-rays.   Ultrasounds.   Nerve conduction studies.  You may need to see a specialist.  TREATMENT  Finding treatment that works well may take time. You may be referred to a pain specialist. He or she may prescribe medicine or therapies, such as:   Mindful meditation or yoga.  Shots (injections) of numbing or pain-relieving medicines into the spine or area of pain.  Local electrical stimulation.  Acupuncture.   Massage therapy.   Aroma, color, light, or sound therapy.   Biofeedback.   Working with a physical therapist to keep from getting stiff.   Regular, gentle exercise.   Cognitive or behavioral therapy.   Group support.   Sometimes, surgery may be recommended.  HOME CARE INSTRUCTIONS   Take all medicines as directed by your health care provider.   Lessen stress in your life by relaxing and doing things such as listening to calming music.   Exercise or be active as directed by your health care provider.   Eat a healthy diet and include things such as vegetables, fruits, fish, and lean meats in your diet.   Keep all follow-up appointments with your health care provider.   Attend a support group with others suffering from chronic pain. SEEK MEDICAL CARE IF:   Your pain gets worse.   You develop a new pain that was not there before.   You cannot tolerate medicines given to you by your health care provider.   You have new symptoms since your last visit with your health care provider.  SEEK IMMEDIATE MEDICAL CARE IF:   You feel weak.   You have decreased sensation or numbness.   You lose control of bowel or  bladder function.   °· Your pain suddenly gets much worse.   °· You develop shaking. °· You develop chills. °· You develop confusion. °· You develop chest pain. °· You develop shortness of breath.   °MAKE SURE YOU: °· Understand these instructions. °· Will watch your condition. °· Will get help right away if you are not doing well or get worse. °Document Released: 02/25/2002 Document Revised: 02/05/2013 Document Reviewed: 11/29/2012 °[employer]® Patient Information ©2015 [employer]. This information is not intended to replace advice given to you by your health care provider. Make sure you discuss any questions you have with your health care provider. ° °
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
## 2014-07-21 ENCOUNTER — Telehealth: Payer: Self-pay

## 2014-07-21 ENCOUNTER — Ambulatory Visit: Payer: Medicare Other | Admitting: Advanced Practice Midwife

## 2014-07-21 NOTE — Telephone Encounter (Signed)
Called pt in question of missed appt.  Pt stated that she has had a lot of appointments today and forgot about the one she had today at the Clinics.  I attempted to ask pt the questions that she had for Dr. Kennon Rounds while Dr. Kennon Rounds was here in house.  Pt stated that she would just reschedule the appt.  I informed pt that her appt with Dr. Kennon Rounds may a ways out.  Pt stated that is fine, I'll just reschedule.  I informed pt that the front desk will call her with an appt.  Pt agreed.  Sent in basket message to the front desk to reschedule appt on Dr.Pratt's schedule.

## 2014-07-22 ENCOUNTER — Encounter: Payer: Self-pay | Admitting: Family Medicine

## 2014-08-24 ENCOUNTER — Encounter: Payer: Self-pay | Admitting: Family Medicine

## 2014-08-24 ENCOUNTER — Ambulatory Visit (INDEPENDENT_AMBULATORY_CARE_PROVIDER_SITE_OTHER): Payer: Medicare Other | Admitting: Family Medicine

## 2014-08-24 VITALS — BP 74/53 | HR 71 | Temp 97.9°F | Ht 65.0 in | Wt 160.5 lb

## 2014-08-24 DIAGNOSIS — R102 Pelvic and perineal pain: Secondary | ICD-10-CM | POA: Diagnosis not present

## 2014-08-24 DIAGNOSIS — N809 Endometriosis, unspecified: Secondary | ICD-10-CM

## 2014-08-24 DIAGNOSIS — G894 Chronic pain syndrome: Secondary | ICD-10-CM

## 2014-08-24 NOTE — Assessment & Plan Note (Signed)
Should no longer be causing pain.
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
## 2014-08-24 NOTE — Patient Instructions (Signed)

## 2014-08-24 NOTE — Assessment & Plan Note (Signed)
Continues but no longer has pelvic organs.

## 2014-08-24 NOTE — Assessment & Plan Note (Signed)
Continues and i wonder if she isn't somewhat over medicated, given her slowness of speech and tendency to fall asleep.

## 2014-11-11 ENCOUNTER — Emergency Department (HOSPITAL_COMMUNITY)
Admission: EM | Admit: 2014-11-11 | Discharge: 2014-11-11 | Disposition: A | Discharge status: Home / Self Care | Payer: Medicare Other | Attending: Emergency Medicine | Admitting: Emergency Medicine

## 2014-11-11 ENCOUNTER — Encounter (HOSPITAL_COMMUNITY): Payer: Self-pay | Admitting: Emergency Medicine

## 2014-11-11 DIAGNOSIS — Y999 Unspecified external cause status: Secondary | ICD-10-CM | POA: Insufficient documentation

## 2014-11-11 DIAGNOSIS — Z7952 Long term (current) use of systemic steroids: Secondary | ICD-10-CM | POA: Insufficient documentation

## 2014-11-11 DIAGNOSIS — F410 Panic disorder [episodic paroxysmal anxiety] without agoraphobia: Secondary | ICD-10-CM | POA: Insufficient documentation

## 2014-11-11 DIAGNOSIS — Z8709 Personal history of other diseases of the respiratory system: Secondary | ICD-10-CM | POA: Insufficient documentation

## 2014-11-11 DIAGNOSIS — Z8669 Personal history of other diseases of the nervous system and sense organs: Secondary | ICD-10-CM | POA: Insufficient documentation

## 2014-11-11 DIAGNOSIS — K219 Gastro-esophageal reflux disease without esophagitis: Secondary | ICD-10-CM | POA: Diagnosis not present

## 2014-11-11 DIAGNOSIS — M1990 Unspecified osteoarthritis, unspecified site: Secondary | ICD-10-CM | POA: Diagnosis not present

## 2014-11-11 DIAGNOSIS — Z23 Encounter for immunization: Secondary | ICD-10-CM | POA: Diagnosis not present

## 2014-11-11 DIAGNOSIS — Z720 Tobacco use: Secondary | ICD-10-CM | POA: Diagnosis not present

## 2014-11-11 DIAGNOSIS — Y929 Unspecified place or not applicable: Secondary | ICD-10-CM | POA: Insufficient documentation

## 2014-11-11 DIAGNOSIS — F329 Major depressive disorder, single episode, unspecified: Secondary | ICD-10-CM | POA: Insufficient documentation

## 2014-11-11 DIAGNOSIS — F42 Obsessive-compulsive disorder: Secondary | ICD-10-CM | POA: Diagnosis not present

## 2014-11-11 DIAGNOSIS — Y939 Activity, unspecified: Secondary | ICD-10-CM | POA: Insufficient documentation

## 2014-11-11 DIAGNOSIS — Z862 Personal history of diseases of the blood and blood-forming organs and certain disorders involving the immune mechanism: Secondary | ICD-10-CM | POA: Diagnosis not present

## 2014-11-11 DIAGNOSIS — S61012A Laceration without foreign body of left thumb without damage to nail, initial encounter: Secondary | ICD-10-CM | POA: Diagnosis present

## 2014-11-11 DIAGNOSIS — Z79899 Other long term (current) drug therapy: Secondary | ICD-10-CM | POA: Diagnosis not present

## 2014-11-11 DIAGNOSIS — Z86018 Personal history of other benign neoplasm: Secondary | ICD-10-CM | POA: Diagnosis not present

## 2014-11-11 MED ORDER — TETANUS-DIPHTH-ACELL PERTUSSIS 5-2.5-18.5 LF-MCG/0.5 IM SUSP
0.5000 mL | Freq: Once | INTRAMUSCULAR | Status: AC
  Administered 2014-11-11: 0.5 mL via INTRAMUSCULAR
  Filled 2014-11-11: qty 0.5

## 2014-11-11 MED ORDER — LIDOCAINE HCL (PF) 1 % IJ SOLN
5.0000 mL | Freq: Once | INTRAMUSCULAR | Status: AC
  Administered 2014-11-11: 5 mL
  Filled 2014-11-11: qty 5
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
## 2014-11-11 NOTE — Discharge Instructions (Signed)
Sutured Wound Care °Sutures are stitches that can be used to close wounds. Wound care helps prevent pain and infection.  °HOME CARE INSTRUCTIONS  °· Rest and elevate the injured area until all the pain and swelling are gone. °· Only take over-the-counter or prescription medicines for pain, discomfort, or fever as directed by your caregiver. °· After 48 hours, gently wash the area with mild soap and water once a day, or as directed. Rinse off the soap. Pat the area dry with a clean towel. Do not rub the wound. This may cause bleeding. °· Follow your caregiver's instructions for how often to change the bandage (dressing). Stop using a dressing after 2 days or after the wound stops draining. °· If the dressing sticks, moisten it with soapy water and gently remove it. °· Apply ointment on the wound as directed. °· Avoid stretching a sutured wound. °· Drink enough fluids to keep your urine clear or pale yellow. °· Follow up with your caregiver for suture removal as directed. °· Use sunscreen on your wound for the next 3 to 6 months so the scar will not darken. °SEEK IMMEDIATE MEDICAL CARE IF:  °· Your wound becomes red, swollen, hot, or tender. °· You have increasing pain in the wound. °· You have a red streak that extends from the wound. °· There is pus coming from the wound. °· You have a fever. °· You have shaking chills. °· There is a bad smell coming from the wound. °· You have persistent bleeding from the wound. °MAKE SURE YOU:  °· Understand these instructions. °· Will watch your condition. °· Will get help right away if you are not doing well or get worse. °Document Released: 07/13/2004 Document Revised: 08/28/2011 Document Reviewed: 10/09/2010 °ExitCare® Patient Information ©2015 [employer]. This information is not intended to replace advice given to you by your health care provider. Make sure you discuss any questions you have with your health care provider. ° °

## 2014-12-02 ENCOUNTER — Ambulatory Visit (INDEPENDENT_AMBULATORY_CARE_PROVIDER_SITE_OTHER): Payer: Medicare Other | Admitting: Neurology

## 2014-12-02 ENCOUNTER — Ambulatory Visit (INDEPENDENT_AMBULATORY_CARE_PROVIDER_SITE_OTHER): Payer: Self-pay | Admitting: Neurology

## 2014-12-02 DIAGNOSIS — M79671 Pain in right foot: Secondary | ICD-10-CM

## 2014-12-02 DIAGNOSIS — R202 Paresthesia of skin: Secondary | ICD-10-CM

## 2014-12-02 DIAGNOSIS — M79672 Pain in left foot: Secondary | ICD-10-CM | POA: Diagnosis not present

## 2014-12-02 NOTE — Procedures (Signed)
[facility]    Provider:  Dr Jaynee Eagles Referring Provider: Sofie Rower, PA-C Primary Care Physician:  Meredith Leeds   HPI:  KATTALEYA ALIA is a 34 y.o. female here as a referral from Dr. Altha Harm for neuropathy. She has pain below the knees, tingling, sharp shooting and stabbing pain. Worse when standing and walking but also bad when laying down. Sore and sensitive to touch. Paresthesias are symmetric.   Summary  Nerve conduction studies were performed on the bilateral lower extremities:  Bilateral Peroneal motor nerves showed normal conductions. Bilateral Tibial motor nerves showed normal conductions. Bilateral Superficial Peroneal sensory nerves were within normal limits Bilateral H Reflexes showed normal latencies  EMG Needle study was performed on selected right-sided muscles:   The Biceps Femoris(long head), Gluteus Maximus, Vastus Medialis, Anterior Tibialis, Medial Gastrocnemius, Extensor Hallucis Longus and Abductor Hallucis muscles and the L5/S1 paraspinals were within normal limits.  Conclusion: This is a normal study. No electrophysiologic evidence for peripheral polyneuropathy or radiculopathy.     Sarina Ill, MD  [facility] [address] [place], [place] [address]  Phone [phone] Fax [phone]

## 2014-12-02 NOTE — Progress Notes (Signed)
  [facility]    Provider:  Dr Jaynee Eagles Referring Provider: Sofie Rower, PA-C Primary Care Physician:  Meredith Leeds   HPI:  Kristen [name] is a 34 y.o. female here as a referral from Dr. Altha Harm for neuropathy. She has pain below the knees, tingling, sharp shooting and stabbing pain. Worse when standing and walking but also bad when laying down. Sore and sensitive to touch. Paresthesias are symmetric.   Summary  Nerve conduction studies were performed on the bilateral lower extremities:  Bilateral Peroneal motor nerves showed normal conductions. Bilateral Tibial motor nerves showed normal conductions. Bilateral Superficial Peroneal sensory nerves were within normal limits Bilateral H Reflexes showed normal latencies  EMG Needle study was performed on selected right-sided muscles:   The Biceps Femoris(long head), Gluteus Maximus, Vastus Medialis, Anterior Tibialis, Medial Gastrocnemius, Extensor Hallucis Longus and Abductor Hallucis muscles and the L5/S1 paraspinals were within normal limits.  Conclusion: This is a normal study. No electrophysiologic evidence for peripheral polyneuropathy or radiculopathy.     Sarina Ill, MD  [facility] [address] [place], [place] [address]  Phone [phone] Fax [phone]

## 2014-12-02 NOTE — Progress Notes (Signed)
See procedure note.

## 2014-12-24 DIAGNOSIS — M545 Low back pain: Secondary | ICD-10-CM | POA: Diagnosis not present

## 2014-12-24 DIAGNOSIS — M542 Cervicalgia: Secondary | ICD-10-CM | POA: Diagnosis not present

## 2014-12-25 DIAGNOSIS — M545 Low back pain: Secondary | ICD-10-CM | POA: Diagnosis not present

## 2014-12-25 DIAGNOSIS — M62838 Other muscle spasm: Secondary | ICD-10-CM | POA: Diagnosis not present

## 2014-12-25 DIAGNOSIS — F419 Anxiety disorder, unspecified: Secondary | ICD-10-CM | POA: Diagnosis not present

## 2014-12-25 DIAGNOSIS — G894 Chronic pain syndrome: Secondary | ICD-10-CM | POA: Diagnosis not present

## 2014-12-29 DIAGNOSIS — M545 Low back pain: Secondary | ICD-10-CM | POA: Diagnosis not present

## 2014-12-29 DIAGNOSIS — M542 Cervicalgia: Secondary | ICD-10-CM | POA: Diagnosis not present

## 2015-01-04 DIAGNOSIS — M545 Low back pain: Secondary | ICD-10-CM | POA: Diagnosis not present

## 2015-01-04 DIAGNOSIS — M542 Cervicalgia: Secondary | ICD-10-CM | POA: Diagnosis not present

## 2015-01-05 ENCOUNTER — Other Ambulatory Visit: Payer: Self-pay | Admitting: Obstetrics and Gynecology

## 2015-01-07 DIAGNOSIS — M545 Low back pain: Secondary | ICD-10-CM | POA: Diagnosis not present

## 2015-01-07 DIAGNOSIS — M542 Cervicalgia: Secondary | ICD-10-CM | POA: Diagnosis not present

## 2015-01-12 DIAGNOSIS — M545 Low back pain: Secondary | ICD-10-CM | POA: Diagnosis not present

## 2015-01-12 DIAGNOSIS — M542 Cervicalgia: Secondary | ICD-10-CM | POA: Diagnosis not present

## 2015-01-13 DIAGNOSIS — M542 Cervicalgia: Secondary | ICD-10-CM | POA: Diagnosis not present

## 2015-01-13 DIAGNOSIS — M545 Low back pain: Secondary | ICD-10-CM | POA: Diagnosis not present

## 2015-01-26 ENCOUNTER — Encounter: Payer: Self-pay | Admitting: Neurology

## 2015-01-26 ENCOUNTER — Other Ambulatory Visit: Payer: Self-pay | Admitting: Neurology

## 2015-01-26 ENCOUNTER — Ambulatory Visit (INDEPENDENT_AMBULATORY_CARE_PROVIDER_SITE_OTHER): Payer: Medicare Other | Admitting: Neurology

## 2015-01-26 VITALS — BP 104/62 | HR 69 | Ht 66.0 in | Wt 168.0 lb

## 2015-01-26 DIAGNOSIS — H5713 Ocular pain, bilateral: Secondary | ICD-10-CM | POA: Diagnosis not present

## 2015-01-26 DIAGNOSIS — H538 Other visual disturbances: Secondary | ICD-10-CM | POA: Insufficient documentation

## 2015-01-26 DIAGNOSIS — G894 Chronic pain syndrome: Secondary | ICD-10-CM | POA: Diagnosis not present

## 2015-01-26 DIAGNOSIS — R519 Headache, unspecified: Secondary | ICD-10-CM | POA: Insufficient documentation

## 2015-01-26 DIAGNOSIS — R202 Paresthesia of skin: Secondary | ICD-10-CM | POA: Diagnosis not present

## 2015-01-26 DIAGNOSIS — R531 Weakness: Secondary | ICD-10-CM | POA: Diagnosis not present

## 2015-01-26 DIAGNOSIS — R51 Headache: Secondary | ICD-10-CM

## 2015-01-26 DIAGNOSIS — G441 Vascular headache, not elsewhere classified: Secondary | ICD-10-CM

## 2015-01-26 DIAGNOSIS — H5710 Ocular pain, unspecified eye: Secondary | ICD-10-CM | POA: Insufficient documentation

## 2015-01-26 NOTE — Patient Instructions (Addendum)
Remember to drink plenty of fluid, eat healthy meals and do not skip any meals. Try to eat protein with a every meal and eat a healthy snack such as fruit or nuts in between meals. Try to keep a regular sleep-wake schedule and try to exercise daily, particularly in the form of walking, 20-30 minutes a day, if you can.   As far as your medications are concerned, I would like to suggest: continue current medications  As far as diagnostic testing: MRi of the brain, labwork
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
## 2015-01-27 LAB — BASIC METABOLIC PANEL
BUN / CREAT RATIO: 25
BUN: 21 mg/dL
CHLORIDE: 103 mmol/L (ref 97–108)
CO2: 24 mmol/L (ref 18–29)
Calcium: 9.8 mg/dL
Creatinine, Ser: 0.83 mg/dL
Glucose: 106 mg/dL — ABNORMAL HIGH (ref 65–99)
Potassium: 5.2 mmol/L (ref 3.5–5.2)
SODIUM: 141 mmol/L (ref 134–144)

## 2015-02-01 ENCOUNTER — Telehealth: Payer: Self-pay | Admitting: *Deleted

## 2015-02-01 NOTE — Telephone Encounter (Signed)
-----   Message from Leia Alf, [place] sent at 02/01/2015  8:32 AM EDT ----- Did not see these Friday! Sorry ----- Message -----    From: Hope Pigeon, RN    Sent: 01/28/2015   5:44 PM      To: Leia Alf, CMA    ----- Message -----    From: Melvenia Beam, MD    Sent: 01/28/2015  11:21 AM      To: Hope Pigeon, RN  Let patient know labs wnl

## 2015-02-01 NOTE — Telephone Encounter (Signed)
Left detailed VM about normal labs. Gave [facility] phone number and office hours if she had further questions.

## 2015-02-10 ENCOUNTER — Inpatient Hospital Stay: Admission: RE | Admit: 2015-02-10 | Payer: Medicare Other | Source: Ambulatory Visit

## 2015-02-16 ENCOUNTER — Telehealth: Payer: Self-pay | Admitting: General Practice

## 2015-02-16 DIAGNOSIS — Z780 Asymptomatic menopausal state: Secondary | ICD-10-CM

## 2015-02-16 MED ORDER — ESTRADIOL 0.5 MG PO TABS: 0.5000 mg | ORAL_TABLET | Freq: Every day | ORAL | Status: AC

## 2015-02-16 NOTE — Telephone Encounter (Signed)
[facility] pharmacy called stating the patient is requesting a refill on her estradiol. Per chart review, one refill was sent in July. Called Dr Harolyn Rutherford covering house, who stated patient can have one year's worth of refills. Med reordered and sent to pharmacy.

## 2015-02-20 ENCOUNTER — Ambulatory Visit
Admission: RE | Admit: 2015-02-20 | Discharge: 2015-02-20 | Disposition: A | Discharge status: Home / Self Care | Payer: Medicare Other | Source: Ambulatory Visit | Attending: Neurology | Admitting: Neurology

## 2015-02-20 DIAGNOSIS — G441 Vascular headache, not elsewhere classified: Secondary | ICD-10-CM | POA: Diagnosis not present

## 2015-02-20 DIAGNOSIS — R202 Paresthesia of skin: Secondary | ICD-10-CM | POA: Diagnosis not present

## 2015-02-20 DIAGNOSIS — H538 Other visual disturbances: Secondary | ICD-10-CM

## 2015-02-20 DIAGNOSIS — H5713 Ocular pain, bilateral: Secondary | ICD-10-CM

## 2015-02-20 DIAGNOSIS — R531 Weakness: Secondary | ICD-10-CM | POA: Diagnosis not present

## 2015-02-20 MED ORDER — GADOBENATE DIMEGLUMINE 529 MG/ML IV SOLN
15.0000 mL | Freq: Once | INTRAVENOUS | Status: AC | PRN
  Administered 2015-02-20: 15 mL via INTRAVENOUS

## 2015-02-23 ENCOUNTER — Telehealth: Payer: Self-pay

## 2015-02-23 NOTE — Telephone Encounter (Signed)
Informed patient that her MRI of the brian was normal.  Patient verbalized understanding.

## 2015-03-04 ENCOUNTER — Emergency Department (HOSPITAL_COMMUNITY)
Admission: EM | Admit: 2015-03-04 | Discharge: 2015-03-04 | Disposition: A | Discharge status: Home / Self Care | Payer: Medicare Other | Attending: Emergency Medicine | Admitting: Emergency Medicine

## 2015-03-04 ENCOUNTER — Encounter (HOSPITAL_COMMUNITY): Payer: Self-pay | Admitting: Emergency Medicine

## 2015-03-04 DIAGNOSIS — Z86018 Personal history of other benign neoplasm: Secondary | ICD-10-CM | POA: Insufficient documentation

## 2015-03-04 DIAGNOSIS — Z791 Long term (current) use of non-steroidal anti-inflammatories (NSAID): Secondary | ICD-10-CM | POA: Insufficient documentation

## 2015-03-04 DIAGNOSIS — K219 Gastro-esophageal reflux disease without esophagitis: Secondary | ICD-10-CM | POA: Insufficient documentation

## 2015-03-04 DIAGNOSIS — G8929 Other chronic pain: Secondary | ICD-10-CM | POA: Insufficient documentation

## 2015-03-04 DIAGNOSIS — F329 Major depressive disorder, single episode, unspecified: Secondary | ICD-10-CM | POA: Insufficient documentation

## 2015-03-04 DIAGNOSIS — Z720 Tobacco use: Secondary | ICD-10-CM | POA: Insufficient documentation

## 2015-03-04 DIAGNOSIS — Z79899 Other long term (current) drug therapy: Secondary | ICD-10-CM | POA: Insufficient documentation

## 2015-03-04 DIAGNOSIS — Z79818 Long term (current) use of other agents affecting estrogen receptors and estrogen levels: Secondary | ICD-10-CM | POA: Insufficient documentation

## 2015-03-04 DIAGNOSIS — Z862 Personal history of diseases of the blood and blood-forming organs and certain disorders involving the immune mechanism: Secondary | ICD-10-CM | POA: Insufficient documentation

## 2015-03-04 DIAGNOSIS — M545 Low back pain: Secondary | ICD-10-CM | POA: Insufficient documentation

## 2015-03-04 DIAGNOSIS — R200 Anesthesia of skin: Secondary | ICD-10-CM | POA: Insufficient documentation

## 2015-03-04 DIAGNOSIS — M158 Other polyosteoarthritis: Secondary | ICD-10-CM | POA: Insufficient documentation

## 2015-03-04 DIAGNOSIS — F410 Panic disorder [episodic paroxysmal anxiety] without agoraphobia: Secondary | ICD-10-CM | POA: Insufficient documentation

## 2015-03-04 LAB — URINALYSIS, ROUTINE W REFLEX MICROSCOPIC
BILIRUBIN URINE: NEGATIVE
Glucose, UA: NEGATIVE mg/dL
HGB URINE DIPSTICK: NEGATIVE
KETONES UR: NEGATIVE mg/dL
NITRITE: NEGATIVE
PROTEIN: NEGATIVE mg/dL
SPECIFIC GRAVITY, URINE: 1.016 (ref 1.005–1.030)
UROBILINOGEN UA: 0.2 mg/dL (ref 0.0–1.0)
pH: 5.5 (ref 5.0–8.0)

## 2015-03-04 LAB — URINE MICROSCOPIC-ADD ON

## 2015-03-04 MED ORDER — KETOROLAC TROMETHAMINE 60 MG/2ML IM SOLN
60.0000 mg | Freq: Once | INTRAMUSCULAR | Status: AC
  Administered 2015-03-04: 60 mg via INTRAMUSCULAR
  Filled 2015-03-04: qty 2

## 2015-03-04 MED ORDER — OXYCODONE-ACETAMINOPHEN 5-325 MG PO TABS: 2.0000 | ORAL_TABLET | Freq: Once | ORAL | Status: DC
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
## 2015-03-04 NOTE — Discharge Instructions (Signed)
Back Pain, Adult °Back pain is very common. The pain often gets better over time. The cause of back pain is usually not dangerous. Most people can learn to manage their back pain on their own.  °HOME CARE  °· Stay active. Start with short walks on flat ground if you can. Try to walk farther each day. °· Do not sit, drive, or stand in one place for more than 30 minutes. Do not stay in bed. °· Do not avoid exercise or work. Activity can help your back heal faster. °· Be careful when you bend or lift an object. Bend at your knees, keep the object close to you, and do not twist. °· Sleep on a firm mattress. Lie on your side, and bend your knees. If you lie on your back, put a pillow under your knees. °· Only take medicines as told by your doctor. °· Put ice on the injured area. °¨ Put ice in a plastic bag. °¨ Place a towel between your skin and the bag. °¨ Leave the ice on for 15-20 minutes, 03-04 times a day for the first 2 to 3 days. After that, you can switch between ice and heat packs. °· Ask your doctor about back exercises or massage. °· Avoid feeling anxious or stressed. Find good ways to deal with stress, such as exercise. °GET HELP RIGHT AWAY IF:  °· Your pain does not go away with rest or medicine. °· Your pain does not go away in 1 week. °· You have new problems. °· You do not feel well. °· The pain spreads into your legs. °· You cannot control when you poop (bowel movement) or pee (urinate). °· Your arms or legs feel weak or lose feeling (numbness). °· You feel sick to your stomach (nauseous) or throw up (vomit). °· You have belly (abdominal) pain. °· You feel like you may pass out (faint). °MAKE SURE YOU:  °· Understand these instructions. °· Will watch your condition. °· Will get help right away if you are not doing well or get worse. °Document Released: 11/22/2007 Document Revised: 08/28/2011 Document Reviewed: 10/07/2013 °[employer]® Patient Information ©2015 [employer]. This information is not intended  to replace advice given to you by your health care provider. Make sure you discuss any questions you have with your health care provider. ° °

## 2015-03-28 ENCOUNTER — Emergency Department (HOSPITAL_COMMUNITY)
Admission: EM | Admit: 2015-03-28 | Discharge: 2015-03-29 | Disposition: A | Discharge status: Home / Self Care | Payer: Medicare Other | Attending: Emergency Medicine | Admitting: Emergency Medicine

## 2015-03-28 ENCOUNTER — Encounter (HOSPITAL_COMMUNITY): Payer: Self-pay | Admitting: Emergency Medicine

## 2015-03-28 ENCOUNTER — Emergency Department (HOSPITAL_COMMUNITY): Payer: Medicare Other

## 2015-03-28 DIAGNOSIS — Z79899 Other long term (current) drug therapy: Secondary | ICD-10-CM | POA: Insufficient documentation

## 2015-03-28 DIAGNOSIS — Z86018 Personal history of other benign neoplasm: Secondary | ICD-10-CM | POA: Insufficient documentation

## 2015-03-28 DIAGNOSIS — M158 Other polyosteoarthritis: Secondary | ICD-10-CM | POA: Insufficient documentation

## 2015-03-28 DIAGNOSIS — Z862 Personal history of diseases of the blood and blood-forming organs and certain disorders involving the immune mechanism: Secondary | ICD-10-CM | POA: Insufficient documentation

## 2015-03-28 DIAGNOSIS — Y9289 Other specified places as the place of occurrence of the external cause: Secondary | ICD-10-CM | POA: Insufficient documentation

## 2015-03-28 DIAGNOSIS — Z8709 Personal history of other diseases of the respiratory system: Secondary | ICD-10-CM | POA: Diagnosis not present

## 2015-03-28 DIAGNOSIS — Y998 Other external cause status: Secondary | ICD-10-CM | POA: Insufficient documentation

## 2015-03-28 DIAGNOSIS — S8992XA Unspecified injury of left lower leg, initial encounter: Secondary | ICD-10-CM | POA: Diagnosis not present

## 2015-03-28 DIAGNOSIS — S299XXA Unspecified injury of thorax, initial encounter: Secondary | ICD-10-CM | POA: Diagnosis not present

## 2015-03-28 DIAGNOSIS — F419 Anxiety disorder, unspecified: Secondary | ICD-10-CM | POA: Insufficient documentation

## 2015-03-28 DIAGNOSIS — S3992XA Unspecified injury of lower back, initial encounter: Secondary | ICD-10-CM | POA: Diagnosis not present

## 2015-03-28 DIAGNOSIS — Y9389 Activity, other specified: Secondary | ICD-10-CM | POA: Diagnosis not present

## 2015-03-28 DIAGNOSIS — Z720 Tobacco use: Secondary | ICD-10-CM | POA: Insufficient documentation

## 2015-03-28 DIAGNOSIS — S161XXA Strain of muscle, fascia and tendon at neck level, initial encounter: Secondary | ICD-10-CM | POA: Insufficient documentation

## 2015-03-28 DIAGNOSIS — S199XXA Unspecified injury of neck, initial encounter: Secondary | ICD-10-CM | POA: Diagnosis present

## 2015-03-28 DIAGNOSIS — S3991XA Unspecified injury of abdomen, initial encounter: Secondary | ICD-10-CM | POA: Insufficient documentation

## 2015-03-28 DIAGNOSIS — W130XXA Fall from, out of or through balcony, initial encounter: Secondary | ICD-10-CM | POA: Insufficient documentation

## 2015-03-28 DIAGNOSIS — Z79818 Long term (current) use of other agents affecting estrogen receptors and estrogen levels: Secondary | ICD-10-CM | POA: Insufficient documentation

## 2015-03-28 DIAGNOSIS — M419 Scoliosis, unspecified: Secondary | ICD-10-CM | POA: Diagnosis not present

## 2015-03-28 DIAGNOSIS — R911 Solitary pulmonary nodule: Secondary | ICD-10-CM | POA: Diagnosis not present

## 2015-03-28 DIAGNOSIS — S0990XA Unspecified injury of head, initial encounter: Secondary | ICD-10-CM | POA: Diagnosis not present

## 2015-03-28 DIAGNOSIS — Z8669 Personal history of other diseases of the nervous system and sense organs: Secondary | ICD-10-CM | POA: Diagnosis not present

## 2015-03-28 DIAGNOSIS — W19XXXA Unspecified fall, initial encounter: Secondary | ICD-10-CM

## 2015-03-28 DIAGNOSIS — S6992XA Unspecified injury of left wrist, hand and finger(s), initial encounter: Secondary | ICD-10-CM | POA: Insufficient documentation

## 2015-03-28 DIAGNOSIS — S6991XA Unspecified injury of right wrist, hand and finger(s), initial encounter: Secondary | ICD-10-CM | POA: Insufficient documentation

## 2015-03-28 DIAGNOSIS — F329 Major depressive disorder, single episode, unspecified: Secondary | ICD-10-CM | POA: Diagnosis not present

## 2015-03-28 DIAGNOSIS — K219 Gastro-esophageal reflux disease without esophagitis: Secondary | ICD-10-CM | POA: Diagnosis not present

## 2015-03-28 DIAGNOSIS — S8991XA Unspecified injury of right lower leg, initial encounter: Secondary | ICD-10-CM | POA: Diagnosis not present

## 2015-03-28 DIAGNOSIS — Z791 Long term (current) use of non-steroidal anti-inflammatories (NSAID): Secondary | ICD-10-CM | POA: Insufficient documentation

## 2015-03-28 DIAGNOSIS — T148XXA Other injury of unspecified body region, initial encounter: Secondary | ICD-10-CM

## 2015-03-28 LAB — CBC WITH DIFFERENTIAL/PLATELET
Basophils Absolute: 0 10*3/uL (ref 0.0–0.1)
Basophils Relative: 0 %
EOS ABS: 0.2 10*3/uL (ref 0.0–0.7)
EOS PCT: 2 %
HCT: 33.3 % — ABNORMAL LOW (ref 36.0–46.0)
Hemoglobin: 11.7 g/dL — ABNORMAL LOW (ref 12.0–15.0)
LYMPHS ABS: 3.7 10*3/uL (ref 0.7–4.0)
LYMPHS PCT: 36 %
MCH: 30 pg (ref 26.0–34.0)
MCHC: 35.1 g/dL (ref 30.0–36.0)
MCV: 85.4 fL (ref 78.0–100.0)
MONO ABS: 0.7 10*3/uL (ref 0.1–1.0)
Monocytes Relative: 7 %
Neutro Abs: 5.6 10*3/uL (ref 1.7–7.7)
Neutrophils Relative %: 55 %
PLATELETS: 293 10*3/uL (ref 150–400)
RBC: 3.9 MIL/uL (ref 3.87–5.11)
RDW: 13.6 % (ref 11.5–15.5)
WBC: 10.2 10*3/uL (ref 4.0–10.5)

## 2015-03-28 LAB — COMPREHENSIVE METABOLIC PANEL
ALT: 13 U/L — ABNORMAL LOW (ref 14–54)
ANION GAP: 6 (ref 5–15)
AST: 16 U/L (ref 15–41)
Albumin: 4.3 g/dL (ref 3.5–5.0)
Alkaline Phosphatase: 46 U/L (ref 38–126)
BUN: 19 mg/dL (ref 6–20)
CHLORIDE: 105 mmol/L (ref 101–111)
CO2: 27 mmol/L (ref 22–32)
CREATININE: 0.85 mg/dL (ref 0.44–1.00)
Calcium: 9.4 mg/dL (ref 8.9–10.3)
Glucose, Bld: 92 mg/dL (ref 65–99)
POTASSIUM: 3.6 mmol/L (ref 3.5–5.1)
Sodium: 138 mmol/L (ref 135–145)
Total Bilirubin: 0.6 mg/dL (ref 0.3–1.2)
Total Protein: 7.5 g/dL (ref 6.5–8.1)

## 2015-03-28 LAB — I-STAT BETA HCG BLOOD, ED (MC, WL, AP ONLY)

## 2015-03-28 MED ORDER — IOHEXOL 300 MG/ML  SOLN
100.0000 mL | Freq: Once | INTRAMUSCULAR | Status: AC | PRN
  Administered 2015-03-28: 100 mL via INTRAVENOUS

## 2015-03-28 MED ORDER — FENTANYL CITRATE (PF) 100 MCG/2ML IJ SOLN
25.0000 ug | Freq: Once | INTRAMUSCULAR | Status: AC
  Administered 2015-03-28: 25 ug via INTRAVENOUS
  Filled 2015-03-28: qty 2

## 2015-03-28 MED ORDER — SODIUM CHLORIDE 0.9 % IV BOLUS (SEPSIS)
1000.0000 mL | Freq: Once | INTRAVENOUS | Status: AC
  Administered 2015-03-28: 1000 mL via INTRAVENOUS

## 2015-03-28 NOTE — ED Provider Notes (Signed)
CSN: [id number]     Arrival date & time 03/28/15  1828 History   First MD Initiated Contact with Patient 03/28/15 1850     Chief Complaint  Patient presents with  . Fall     (Consider location/radiation/quality/duration/timing/severity/associated sxs/prior Treatment) HPI Comments: 3rd story balcony caved in and fell, 10 people fell on top of her last night at midnight Hurting everywhere Both legs Right hand Back pain Headache, left side Neck Lightheaded when bending down, sitting up Tip of toes hurting Severe pain, sharp aching    Patient is a 34 y.o. female presenting with fall.  Fall This is a new problem. The problem has not changed since onset.Associated symptoms include chest pain, abdominal pain and headaches. Pertinent negatives include no shortness of breath.    Past Medical History  Diagnosis Date  . Sickle cell trait ([place])   . Borderline diabetes     no meds  . Scoliosis   . Hernia, umbilical   . Depression   . Anxiety   . Panic attack   . OCD (obsessive compulsive disorder)   . Fibromyalgia   . Carpal tunnel syndrome on both sides   . Fibroid, uterine   . GERD (gastroesophageal reflux disease)   . Bipolar disorder ([place])   . Hx of bronchitis   . Headache(784.0)     otc meds prn  . Arthritis     lower back, hands, feet  . Anemia     history  . Umbilical hernia    Past Surgical History  Procedure Laterality Date  . Wisdom tooth extraction    . Back surgery      2 rods in back -scoliosis  . Laparoscopy N/A 10/29/2013    Procedure: LAPAROSCOPY DIAGNOSTIC;  Surgeon: Donnamae Jude, MD;  Location: [facility];  Service: Gynecology;  Laterality: N/A;  . Abdominal hysterectomy N/A 02/10/2014    Procedure: HYSTERECTOMY ABDOMINAL;  Surgeon: Donnamae Jude, MD;  Location: [facility];  Service: Gynecology;  Laterality: N/A;  . Salpingoophorectomy Bilateral 02/10/2014    Procedure: SALPINGO OOPHORECTOMY;  Surgeon: Donnamae Jude, MD;  Location: [facility];  Service:  Gynecology;  Laterality: Bilateral;  . Umbilical hernia repair N/A 02/10/2014    Procedure: HERNIA REPAIR UMBILICAL ADULT;  Surgeon: Donnamae Jude, MD;  Location: [facility];  Service: Gynecology;  Laterality: N/A;   Family History  Problem Relation Age of Onset  . Hypertension Mother   . Diabetes Mother   . Fibromyalgia Mother   . Stroke Mother   . Cancer Maternal Grandmother    Social History  Substance Use Topics  . Smoking status: Current Every Day Smoker -- 0.25 packs/day for 19 years    Types: Cigarettes  . Smokeless tobacco: Never Used  . Alcohol Use: 0.0 oz/week    0 Standard drinks or equivalent per week   OB History    Gravida Para Term Preterm AB TAB SAB Ectopic Multiple Living   0              Review of Systems  Constitutional: Negative for fever.  HENT: Negative for sore throat.   Eyes: Negative for visual disturbance.  Respiratory: Negative for cough and shortness of breath.   Cardiovascular: Positive for chest pain.  Gastrointestinal: Positive for abdominal pain. Negative for nausea and vomiting.  Genitourinary: Negative for difficulty urinating.  Musculoskeletal: Positive for myalgias, back pain, joint swelling, arthralgias and neck pain.  Skin: Negative for rash.  Neurological: Positive for light-headedness and headaches. Negative  for syncope and numbness.      Allergies  Tramadol hcl  Home Medications   Prior to Admission medications   Medication Sig Start Date End Date Taking? Authorizing Provider  estradiol (ESTRACE) 0.5 MG tablet Take 1 tablet (0.5 mg total) by mouth daily. 02/16/15  Yes Osborne Oman, MD  fexofenadine (ALLEGRA) 180 MG tablet Take 180 mg by mouth daily.   Yes Historical Provider, MD  meloxicam (MOBIC) 15 MG tablet Take 15 mg by mouth daily. 11/23/14  Yes Historical Provider, MD  mirtazapine (REMERON) 15 MG tablet Take 15 mg by mouth at bedtime.   Yes Historical Provider, MD  omeprazole (PRILOSEC) 20 MG capsule TAKE 1 CAPSULE EVERY  DAY 04/20/14  Yes Donnamae Jude, MD  ondansetron (ZOFRAN ODT) 8 MG disintegrating tablet Take 1 tablet (8 mg total) by mouth every 8 (eight) hours as needed for nausea. 01/12/14  Yes Donnamae Jude, MD  pregabalin (LYRICA) 75 MG capsule Take 75 mg by mouth 2 (two) times daily.   Yes Historical Provider, MD  albuterol (PROVENTIL HFA;VENTOLIN HFA) 108 (90 BASE) MCG/ACT inhaler Inhale 1 puff into the lungs every 6 (six) hours as needed for wheezing or shortness of breath. 03/29/15   Gareth Morgan, MD  AMRIX 15 MG 24 hr capsule Take 15 mg by mouth daily as needed for muscle spasms.  12/28/14   Historical Provider, MD  HYDROcodone-acetaminophen (NORCO) 5-325 MG per tablet Take 1-2 tablets by mouth every 6 (six) hours as needed for moderate pain or severe pain. Patient not taking: Reported on 01/26/2015 06/25/14   Orlie Dakin, MD  ibuprofen (ADVIL,MOTRIN) 800 MG tablet Take 800 mg by mouth every 6 (six) hours as needed for moderate pain.  08/23/14   Historical Provider, MD  LORazepam (ATIVAN) 1 MG tablet Take 1 tablet (1 mg total) by mouth 3 (three) times daily as needed for anxiety. Patient not taking: Reported on 03/04/2015 07/05/14   Delos Haring, PA-C  tiZANidine (ZANAFLEX) 2 MG tablet Take 2 mg by mouth 2 (two) times daily as needed for muscle spasms.  11/23/14   Historical Provider, MD   BP 129/92 mmHg  Pulse 61  Temp(Src) 98.2 F (36.8 C) (Oral)  Resp 18  Ht 5' 6.5" (1.689 m)  Wt 165 lb (74.844 kg)  BMI 26.24 kg/m2  SpO2 100%  LMP 12/31/2012 Physical Exam  Constitutional: She is oriented to person, place, and time. She appears well-developed and well-nourished. No distress.  HENT:  Head: Normocephalic and atraumatic.  Eyes: Conjunctivae and EOM are normal.  Neck: Normal range of motion.  Cardiovascular: Normal rate, regular rhythm, normal heart sounds and intact distal pulses.  Exam reveals no gallop and no friction rub.   No murmur heard. Pulmonary/Chest: Effort normal and breath sounds  normal. No respiratory distress. She has no wheezes. She has no rales. She exhibits tenderness.  Abdominal: Soft. She exhibits no distension. There is tenderness (diffuse). There is no guarding.  Musculoskeletal: She exhibits no edema.       Cervical back: She exhibits tenderness and bony tenderness.       Thoracic back: She exhibits tenderness and bony tenderness.       Lumbar back: She exhibits tenderness and bony tenderness.  Pt reports tenderness (nearly jumping out of bed) in all locations palpated in upper extremities, lower extremities, neck and back, worse in bilateral lower extremities, right hand, noted to have full ROM of joints spontaneously, no areas of swelling or contusion  Neurological: She  is alert and oriented to person, place, and time.  Skin: Skin is warm and dry. No rash noted. She is not diaphoretic. No erythema.  Nursing note and vitals reviewed.   ED Course  Procedures (including critical care time) Labs Review Labs Reviewed  CBC WITH DIFFERENTIAL/PLATELET - Abnormal; Notable for the following:    Hemoglobin 11.7 (*)    HCT 33.3 (*)    All other components within normal limits  COMPREHENSIVE METABOLIC PANEL - Abnormal; Notable for the following:    ALT 13 (*)    All other components within normal limits  I-STAT BETA HCG BLOOD, ED ([facility], [facility], [facility] ONLY)    Imaging Review Dg Wrist Complete Right  03/29/2015   CLINICAL DATA:  34 year old female who fell through a balcony. Pain. Initial encounter.  EXAM: RIGHT WRIST - COMPLETE 3+ VIEW  COMPARISON:  Right hand series from today reported separately.  FINDINGS: There is no evidence of fracture or dislocation. There is no evidence of arthropathy or other focal bone abnormality. Soft tissues are unremarkable.  IMPRESSION: No acute fracture or dislocation identified about the right wrist.   Electronically Signed   By: Genevie Ann M.D.   On: 03/29/2015 00:20   Dg Tibia/fibula Right  03/29/2015   CLINICAL DATA:  34 year old female  who fell through a balcony. Pain. Initial encounter.  EXAM: RIGHT TIBIA AND FIBULA - 2 VIEW  COMPARISON:  Right ankle series from today reported separately.  FINDINGS: Right tibia and fibula appear intact. Alignment about the right knee and ankle appears preserved. Bone mineralization is within normal limits.  IMPRESSION: No acute fracture or dislocation identified about the right tib-fib.   Electronically Signed   By: Genevie Ann M.D.   On: 03/29/2015 00:18   Dg Ankle Complete Left  03/29/2015   CLINICAL DATA:  34 year old female who fell through a balcony. Pain. Initial encounter.  EXAM: LEFT ANKLE COMPLETE - 3+ VIEW  COMPARISON:  None.  FINDINGS: Bone mineralization is within normal limits. Mortise joint alignment preserved. No joint effusion identified. Calcaneus intact. Talar dome intact. Distal tibia and fibula intact.  IMPRESSION: No acute fracture or dislocation identified about the left ankle.   Electronically Signed   By: Genevie Ann M.D.   On: 03/29/2015 00:21   Dg Ankle Complete Right  03/29/2015   CLINICAL DATA:  34 year old female who fell through a balcony. Pain. Initial encounter.  EXAM: RIGHT ANKLE - COMPLETE 3+ VIEW  COMPARISON:  None.  FINDINGS: Mortise joint alignment preserved. No joint effusion identified. Calcaneus intact. Talar dome intact. Distalfibula and tibia intact. Bone mineralization is within normal limits.  IMPRESSION: No acute fracture or dislocation identified about the right ankle.   Electronically Signed   By: Genevie Ann M.D.   On: 03/29/2015 00:17   Ct Head Wo Contrast  03/28/2015   CLINICAL DATA:  34 year old female fell through a balcony. Fall 3 stories. Pain. Initial encounter.  EXAM: CT HEAD WITHOUT CONTRAST  CT CERVICAL SPINE WITHOUT CONTRAST  TECHNIQUE: Multidetector CT imaging of the head and cervical spine was performed following the standard protocol without intravenous contrast. Multiplanar CT image reconstructions of the cervical spine were also generated.   COMPARISON:  Chest CT from today reported separately. Cervical spine MRI 03/25/2015 [facility]. Head and cervical spine CT 02/15/2013.  FINDINGS: CT HEAD FINDINGS  Visualized paranasal sinuses and mastoids are clear. No scalp hematoma identified. Visualized orbit soft tissues are within normal limits. Calvarium intact.  Cerebral volume remains  normal. No midline shift, mass effect, or evidence of intracranial mass lesion. No ventriculomegaly. Gray-white matter differentiation is within normal limits throughout the brain. No acute intracranial hemorrhage identified. No cortically based acute infarct identified.  CT CERVICAL SPINE FINDINGS  Cervicothoracic scoliosis with chronic reversed cervical lordosis, and posterior thoracic spinal rods beginning at the T1-T2 lamina level.  Visualized skull base is intact. No atlanto-occipital dissociation. Cervicothoracic junction alignment is within normal limits. Posterior element alignment stable and within normal limits. No acute cervical spine fracture identified. Visualized upper thoracic levels appear stable. Negative lung apices. Some motion artifact in the prevertebral soft tissues. Otherwise negative noncontrast paraspinal soft tissues.  IMPRESSION: 1. Stable and normal noncontrast CT appearance of the brain. 2. No acute fracture or listhesis identified in the cervical spine. Ligamentous injury is not excluded.   Electronically Signed   By: Genevie Ann M.D.   On: 03/28/2015 23:34   Ct Chest W Contrast  03/28/2015   CLINICAL DATA:  34 year old female fell through a balcony. Fall 3 stories. Pain. Initial encounter.  EXAM: CT CHEST, ABDOMEN, AND PELVIS WITH CONTRAST  TECHNIQUE: Multidetector CT imaging of the chest, abdomen and pelvis was performed following the standard protocol during bolus administration of intravenous contrast.  CONTRAST:  11mL OMNIPAQUE IOHEXOL 300 MG/ML  SOLN  COMPARISON:  Thoracic spine radiographs 07/05/2014. CT Abdomen and  Pelvis 07/09/2009.  FINDINGS: CT CHEST FINDINGS  Scoliosis with posterior thoracic spinal rods. Associated streak artifact in the chest. Major airways are patent. No pleural effusion. No pneumothorax identified. There is a 7 mm pulmonary nodule in the left anterior lung on series 6, image 30. This area was not included in 2011.  No displaced rib fracture identified. No thoracic spine fracture identified. Sternum appears intact. Visualize shoulder osseous structures appear intact.  No pericardial effusion. No definite mediastinal hematoma; anterior superior triangular mediastinal density probably is residual thymus and appears separate from the aorta (series 2, image 18). Streak artifact related to spinal hardware. In allowing for this the thoracic aorta appears within normal limits. No axillary or mediastinal lymphadenopathy. Negative thoracic inlet. No superficial chest wall soft tissue injury identified.  CT ABDOMEN PELVIS FINDINGS  Posterior spinal rods terminate at the T12-L1 lamina. Lumbar levels appear intact. Sacrum and SI joints appear intact. No pelvis fracture or proximal femur fracture identified.  Trace excreted IV contrast in the urinary bladder. Uterus now appears to be surgically absent. Adnexa diminutive or absent. No pelvic free fluid identified. Decompressed distal bowel loops in the pelvis.  Negative sigmoid colon, left colon, transverse colon (redundant) right colon and appendix. No dilated or abnormal small bowel loops. Mostly decompressed stomach and duodenum.  No free air or abdominal free fluid. Streak artifact through the upper abdomen from the lower thoracic hardware. No liver or splenic injury identified. Negative pancreas and adrenal glands. Both kidneys are excreting IV contrast with otherwise normal enhancement. There is some excreted IV contrast along the course of the right ureter. Portal venous system appears patent. Major arterial structures in the abdomen and pelvis are patent with  some calcified atherosclerosis. No lymphadenopathy identified.  No superficial soft tissue injury identified.  IMPRESSION: 1. No acute traumatic injury identified. Mildly degraded detail of the thorax and upper abdomen related to thoracic spinal hardware streak artifact. 2. 7 mm left lung nodule. If the patient is at high risk for bronchogenic carcinoma, follow-up chest CT at 3-63months is recommended. If the patient is at low risk for bronchogenic carcinoma, follow-up chest CT  at 6-12 months is recommended. This recommendation follows the consensus statement: Guidelines for Management of Small Pulmonary Nodules Detected on CT Scans: A Statement from the [place] as published in Radiology 2005; 237:395-400.   Electronically Signed   By: Genevie Ann M.D.   On: 03/28/2015 23:30   Ct Cervical Spine Wo Contrast  03/28/2015   CLINICAL DATA:  34 year old female fell through a balcony. Fall 3 stories. Pain. Initial encounter.  EXAM: CT HEAD WITHOUT CONTRAST  CT CERVICAL SPINE WITHOUT CONTRAST  TECHNIQUE: Multidetector CT imaging of the head and cervical spine was performed following the standard protocol without intravenous contrast. Multiplanar CT image reconstructions of the cervical spine were also generated.  COMPARISON:  Chest CT from today reported separately. Cervical spine MRI 03/25/2015 [facility]. Head and cervical spine CT 02/15/2013.  FINDINGS: CT HEAD FINDINGS  Visualized paranasal sinuses and mastoids are clear. No scalp hematoma identified. Visualized orbit soft tissues are within normal limits. Calvarium intact.  Cerebral volume remains normal. No midline shift, mass effect, or evidence of intracranial mass lesion. No ventriculomegaly. Gray-white matter differentiation is within normal limits throughout the brain. No acute intracranial hemorrhage identified. No cortically based acute infarct identified.  CT CERVICAL SPINE FINDINGS  Cervicothoracic scoliosis with chronic  reversed cervical lordosis, and posterior thoracic spinal rods beginning at the T1-T2 lamina level.  Visualized skull base is intact. No atlanto-occipital dissociation. Cervicothoracic junction alignment is within normal limits. Posterior element alignment stable and within normal limits. No acute cervical spine fracture identified. Visualized upper thoracic levels appear stable. Negative lung apices. Some motion artifact in the prevertebral soft tissues. Otherwise negative noncontrast paraspinal soft tissues.  IMPRESSION: 1. Stable and normal noncontrast CT appearance of the brain. 2. No acute fracture or listhesis identified in the cervical spine. Ligamentous injury is not excluded.   Electronically Signed   By: Genevie Ann M.D.   On: 03/28/2015 23:34   Ct Abdomen Pelvis W Contrast  03/28/2015   CLINICAL DATA:  34 year old female fell through a balcony. Fall 3 stories. Pain. Initial encounter.  EXAM: CT CHEST, ABDOMEN, AND PELVIS WITH CONTRAST  TECHNIQUE: Multidetector CT imaging of the chest, abdomen and pelvis was performed following the standard protocol during bolus administration of intravenous contrast.  CONTRAST:  143mL OMNIPAQUE IOHEXOL 300 MG/ML  SOLN  COMPARISON:  Thoracic spine radiographs 07/05/2014. CT Abdomen and Pelvis 07/09/2009.  FINDINGS: CT CHEST FINDINGS  Scoliosis with posterior thoracic spinal rods. Associated streak artifact in the chest. Major airways are patent. No pleural effusion. No pneumothorax identified. There is a 7 mm pulmonary nodule in the left anterior lung on series 6, image 30. This area was not included in 2011.  No displaced rib fracture identified. No thoracic spine fracture identified. Sternum appears intact. Visualize shoulder osseous structures appear intact.  No pericardial effusion. No definite mediastinal hematoma; anterior superior triangular mediastinal density probably is residual thymus and appears separate from the aorta (series 2, image 18). Streak artifact  related to spinal hardware. In allowing for this the thoracic aorta appears within normal limits. No axillary or mediastinal lymphadenopathy. Negative thoracic inlet. No superficial chest wall soft tissue injury identified.  CT ABDOMEN PELVIS FINDINGS  Posterior spinal rods terminate at the T12-L1 lamina. Lumbar levels appear intact. Sacrum and SI joints appear intact. No pelvis fracture or proximal femur fracture identified.  Trace excreted IV contrast in the urinary bladder. Uterus now appears to be surgically absent. Adnexa diminutive or absent. No pelvic free fluid identified. Decompressed  distal bowel loops in the pelvis.  Negative sigmoid colon, left colon, transverse colon (redundant) right colon and appendix. No dilated or abnormal small bowel loops. Mostly decompressed stomach and duodenum.  No free air or abdominal free fluid. Streak artifact through the upper abdomen from the lower thoracic hardware. No liver or splenic injury identified. Negative pancreas and adrenal glands. Both kidneys are excreting IV contrast with otherwise normal enhancement. There is some excreted IV contrast along the course of the right ureter. Portal venous system appears patent. Major arterial structures in the abdomen and pelvis are patent with some calcified atherosclerosis. No lymphadenopathy identified.  No superficial soft tissue injury identified.  IMPRESSION: 1. No acute traumatic injury identified. Mildly degraded detail of the thorax and upper abdomen related to thoracic spinal hardware streak artifact. 2. 7 mm left lung nodule. If the patient is at high risk for bronchogenic carcinoma, follow-up chest CT at 3-57months is recommended. If the patient is at low risk for bronchogenic carcinoma, follow-up chest CT at 6-12 months is recommended. This recommendation follows the consensus statement: Guidelines for Management of Small Pulmonary Nodules Detected on CT Scans: A Statement from the [place] as published  in Radiology 2005; 237:395-400.   Electronically Signed   By: Genevie Ann M.D.   On: 03/28/2015 23:30   Dg Knee Complete 4 Views Left  03/29/2015   CLINICAL DATA:  34 year old female who fell through a balcony. Pain. Initial encounter.  EXAM: LEFT KNEE - COMPLETE 4+ VIEW  COMPARISON:  None.  FINDINGS: Bone mineralization is within normal limits. Joint spaces and alignment preserved. Patella intact. No joint effusion identified. No fracture or dislocation identified.  IMPRESSION: No acute fracture or dislocation identified about the left knee.   Electronically Signed   By: Genevie Ann M.D.   On: 03/29/2015 00:20   Dg Knee Complete 4 Views Right  03/29/2015   CLINICAL DATA:  34 year old female who fell through a balcony. Pain. Initial encounter.  EXAM: RIGHT KNEE - COMPLETE 4+ VIEW  COMPARISON:  None.  FINDINGS: Bone mineralization is within normal limits. Joint spaces and alignment preserved. Patella appears intact. No joint effusion identified.  IMPRESSION: No acute fracture or dislocation identified about the right knee.   Electronically Signed   By: Genevie Ann M.D.   On: 03/29/2015 00:16   Dg Hand Complete Right  03/29/2015   CLINICAL DATA:  34 year old female who fell through a balcony. Pain. Initial encounter.  EXAM: RIGHT HAND - COMPLETE 3+ VIEW  COMPARISON:  None.  FINDINGS: Bone mineralization is within normal limits. Distal radius and ulna appear intact. Carpal bone alignment within normal limits. No metacarpal or phalanx fracture identified.  IMPRESSION: No acute fracture or dislocation identified about the right hand.   Electronically Signed   By: Genevie Ann M.D.   On: 03/29/2015 00:19   Dg Foot Complete Left  03/29/2015   CLINICAL DATA:  34 year old female who fell through a balcony. Pain. Initial encounter.  EXAM: LEFT FOOT - COMPLETE 3+ VIEW  COMPARISON:  Left ankle series from today reported separately.  FINDINGS: Bone mineralization is within normal limits. Calcaneus intact. Joint spaces and  alignment in the left foot are within normal limits. No acute fracture or dislocation identified.  IMPRESSION: No acute fracture or dislocation identified about the left foot.   Electronically Signed   By: Genevie Ann M.D.   On: 03/29/2015 00:22   I have personally reviewed and evaluated these images and lab results as part  of my medical decision-making.   EKG Interpretation None      MDM   Final diagnoses:  Lung nodule, 6mm, please follow up with chest CT in 3-6 months for evaluation of change  Fall, initial encounter  Contusion  Cervical strain, initial encounter    34yo female with history of chronic pain presents with concern 3rd story balcony caving in and pt falling with 10 people falling on top of her last night. Pt reports significant tenderness over all areas of neck, back, bilateral lower extremities and by history pain in all locations.  Given mechanism described and significant tenderness, pt had CT of head, cspine, C/A/P and XR of right knee, ankle foot, left knee, ankle foot and right hand and wrist that all showed no sign of injury.  While cervical ligamentous injury is unlikely given exam with tenderness in all locations, pt with pain in midline and will maintain CCollar until she is able to follow up with physician in 1 week for reevaluation.  Pt with pulmonary nodule and discussed need for 3-70mo follow up CT.  Recommend tylenol/ibuprofen for pain. Patient discharged in stable condition with understanding of reasons to return.    Gareth Morgan, MD 03/29/15 [phone]

## 2015-03-28 NOTE — ED Notes (Signed)
Pt states around 12am this am she was on 3rd story balcony that collapsed.  Pt states that she has pain in leg, head, neck, hand.  Pt states that about 10 people fell on top of her.  Pt ambulated in from the lobby with steady gait.  Pt has a cane that she is using.

## 2015-03-28 NOTE — ED Notes (Signed)
Pt reported falling through a balcony with pain to bil lower ext, entire back, neck, bil shoulders/arm, chest, abd and lt lateral head. Pt reported lightheadedness/dizziness, LOC, vision disturbances, headache, and nausea/vomiting x4.

## 2015-03-29 DIAGNOSIS — S161XXA Strain of muscle, fascia and tendon at neck level, initial encounter: Secondary | ICD-10-CM | POA: Diagnosis not present

## 2015-03-29 MED ORDER — ALBUTEROL SULFATE HFA 108 (90 BASE) MCG/ACT IN AERS: 1.0000 | INHALATION_SPRAY | Freq: Four times a day (QID) | RESPIRATORY_TRACT | Status: AC | PRN

## 2015-03-29 NOTE — Discharge Instructions (Signed)
Pulmonary Nodule A pulmonary nodule is a small, round growth of tissue in the lung. Pulmonary nodules can range in size from less than 1/5 inch (4 mm) to a little bigger than an inch (25 mm). Most pulmonary nodules are detected when imaging tests of the lung are being performed for a different problem. Pulmonary nodules are usually not cancerous (benign). However, some pulmonary nodules are cancerous (malignant). Follow-up treatment or testing is based on the size of the pulmonary nodule and your risk of getting lung cancer.  CAUSES Benign pulmonary nodules can be caused by various things. Some of the causes include:   Bacterial, fungal, or viral infections. This is usually an old infection that is no longer active, but it can sometimes be a current, active infection.  A benign mass of tissue.  Inflammation from conditions such as rheumatoid arthritis.   Abnormal blood vessels in the lungs. Malignant pulmonary nodules can result from lung cancer or from cancers that spread to the lung from other places in the body. SIGNS AND SYMPTOMS Pulmonary nodules usually do not cause symptoms. DIAGNOSIS Most often, pulmonary nodules are found incidentally when an X-ray or CT scan is performed to look for some other problem in the lung area. To help determine whether a pulmonary nodule is benign or malignant, your health care provider will take a medical history and order a variety of tests. Tests done may include:   Blood tests.  A skin test called a tuberculin test. This test is used to determine if you have been exposed to the germ that causes tuberculosis.   Chest X-rays. If possible, a new X-ray may be compared with X-rays you have had in the past.   CT scan. This test shows smaller pulmonary nodules more clearly than an X-ray.   Positron emission tomography (PET) scan. In this test, a safe amount of a radioactive substance is injected into the bloodstream. Then, the scan takes a picture of  the pulmonary nodule. The radioactive substance is eliminated from your body in your urine.   Biopsy. A tiny piece of the pulmonary nodule is removed so it can be checked under a microscope. TREATMENT  Pulmonary nodules that are benign normally do not require any treatment because they usually do not cause symptoms or breathing problems. Your health care provider may want to monitor the pulmonary nodule through follow-up CT scans. The frequency of these CT scans will vary based on the size of the nodule and the risk factors for lung cancer. For example, CT scans will need to be done more frequently if the pulmonary nodule is larger and if you have a history of smoking and a family history of cancer. Further testing or biopsies may be done if any follow-up CT scan shows that the size of the pulmonary nodule has increased. HOME CARE INSTRUCTIONS  Only take over-the-counter or prescription medicines as directed by your health care provider.  Keep all follow-up appointments with your health care provider. SEEK MEDICAL CARE IF:  You have trouble breathing when you are active.   You feel sick or unusually tired.   You do not feel like eating.   You lose weight without trying to.   You develop chills or night sweats.  SEEK IMMEDIATE MEDICAL CARE IF:  You cannot catch your breath, or you begin wheezing.   You cannot stop coughing.   You cough up blood.   You become dizzy or feel like you are going to pass out.   You  have sudden chest pain.   You have a fever or persistent symptoms for more than 2-3 days.   You have a fever and your symptoms suddenly get worse. MAKE SURE YOU:  Understand these instructions.  Will watch your condition.  Will get help right away if you are not doing well or get worse.   This information is not intended to replace advice given to you by your health care provider. Make sure you discuss any questions you have with your health care  provider.   Document Released: 04/02/2009 Document Revised: 02/05/2013 Document Reviewed: 11/25/2012 Elsevier Interactive Patient Education 2016 [employer] Cervical Sprain A cervical sprain is when the tissues (ligaments) that hold the neck bones in place stretch or tear. HOME CARE   Put ice on the injured area.  Put ice in a plastic bag.  Place a towel between your skin and the bag.  Leave the ice on for 15-20 minutes, 3-4 times a day.  You may have been given a collar to wear. This collar keeps your neck from moving while you heal.  Do not take the collar off unless told by your doctor.  If you have long hair, keep it outside of the collar.  Ask your doctor before changing the position of your collar. You may need to change its position over time to make it more comfortable.  If you are allowed to take off the collar for cleaning or bathing, follow your doctor's instructions on how to do it safely.  Keep your collar clean by wiping it with mild soap and water. Dry it completely. If the collar has removable pads, remove them every 1-2 days to hand wash them with soap and water. Allow them to air dry. They should be dry before you wear them in the collar.  Do not drive while wearing the collar.  Only take medicine as told by your doctor.  Keep all doctor visits as told.  Keep all physical therapy visits as told.  Adjust your work station so that you have good posture while you work.  Avoid positions and activities that make your problems worse.  Warm up and stretch before being active. GET HELP IF:  Your pain is not controlled with medicine.  You cannot take less pain medicine over time as planned.  Your activity level does not improve as expected. GET HELP RIGHT AWAY IF:   You are bleeding.  Your stomach is upset.  You have an allergic reaction to your medicine.  You develop new problems that you cannot explain.  You lose feeling (become numb) or you  cannot move any part of your body (paralysis).  You have tingling or weakness in any part of your body.  Your symptoms get worse. Symptoms include:  Pain, soreness, stiffness, puffiness (swelling), or a burning feeling in your neck.  Pain when your neck is touched.  Shoulder or upper back pain.  Limited ability to move your neck.  Headache.  Dizziness.  Your hands or arms feel week, lose feeling, or tingle.  Muscle spasms.  Difficulty swallowing or chewing. MAKE SURE YOU:   Understand these instructions.  Will watch your condition.  Will get help right away if you are not doing well or get worse.   This information is not intended to replace advice given to you by your health care provider. Make sure you discuss any questions you have with your health care provider.   Document Released: 11/22/2007 Document Revised: 02/05/2013 Document Reviewed: 12/11/2012  Chartered certified accountant Patient Education [employer].

## 2015-04-06 ENCOUNTER — Encounter: Payer: Self-pay | Admitting: Internal Medicine

## 2015-04-06 ENCOUNTER — Ambulatory Visit (INDEPENDENT_AMBULATORY_CARE_PROVIDER_SITE_OTHER): Payer: Medicare Other | Admitting: Internal Medicine

## 2015-04-06 VITALS — BP 112/66 | HR 71 | Ht 66.5 in | Wt 173.0 lb

## 2015-04-06 DIAGNOSIS — R0600 Dyspnea, unspecified: Secondary | ICD-10-CM | POA: Diagnosis not present

## 2015-04-06 DIAGNOSIS — R05 Cough: Secondary | ICD-10-CM

## 2015-04-06 DIAGNOSIS — R911 Solitary pulmonary nodule: Secondary | ICD-10-CM | POA: Diagnosis not present

## 2015-04-06 DIAGNOSIS — F17210 Nicotine dependence, cigarettes, uncomplicated: Secondary | ICD-10-CM

## 2015-04-06 DIAGNOSIS — R058 Other specified cough: Secondary | ICD-10-CM

## 2015-04-06 MED ORDER — PANTOPRAZOLE SODIUM 40 MG PO TBEC: 40.0000 mg | DELAYED_RELEASE_TABLET | Freq: Every day | ORAL | Status: DC

## 2015-04-06 NOTE — Progress Notes (Signed)
Subjective:    Patient ID: Kristen [name], female    DOB: [birth year],    MRN: [id number]  HPI  26 yobf active smoker and extremely difficult historian s/p scoloisis surgery age 34 bothered by back pain since and abd pain with vomiting and freq gasping for air > allergy eval across from [place] > allergic to dust mites/ maybe pet > some better with allegra > best days => mailbox ok / walk across parking but half the time can't even get out of bed due to pain >> resp symptoms referred to [facility] 04/06/2015 by Dr Altha Harm for eval of incidental lingular nodule on CT scan done after she fell off a balcony after a rail gave way following she says about "3 stories onto a gravel surface" 03/28/15 > see ER eval (not admitted).    04/06/2015 1st [facility] office visit/ Wert   Chief Complaint  Patient presents with  . Pulmonary Consult    Referred by Dr Smith Robert for eval of pulmonary nodule. Pt c/o SOB and cough "always".     Patient failed to answer a single question asked in a straightforward manner, tending to go off on tangents or answer questions with ambiguous medical terms or diagnoses and seemed aggravated  when asked the same question more than once for clarification.  However, hx is as above in terms of chronolgic events. Breathing is extremely variable with no pattern associated with cough also with no pattern typically dry - breathing no better on a "bad day " when she uses her saba   No assoc hemoptysis or classically extertional or pleuritic  cp or chest tightness, subjective wheeze overt sinus or hb symptoms. No unusual exp hx or h/o childhood pna/ asthma or knowledge of premature birth.  Sleeping ok without nocturnal  or early am exacerbation  of respiratory  c/o's or need for noct saba. Also denies any obvious fluctuation of symptoms with weather or environmental changes or other aggravating or alleviating factors except as outlined above   Current  Medications, Allergies, Complete Past Medical History, Past Surgical History, Family History, and Social History were reviewed in [employer] record.           Review of Systems  Constitutional: Negative for fever, chills and unexpected weight change.  HENT: Negative for congestion, dental problem, ear pain, nosebleeds, postnasal drip, rhinorrhea, sinus pressure, sneezing, sore throat, trouble swallowing and voice change.   Eyes: Negative for visual disturbance.  Respiratory: Positive for cough and shortness of breath. Negative for choking.   Cardiovascular: Negative for chest pain and leg swelling.  Gastrointestinal: Negative for vomiting, abdominal pain and diarrhea.  Genitourinary: Negative for difficulty urinating.  Musculoskeletal: Positive for arthralgias.  Skin: Negative for rash.  Neurological: Negative for tremors, syncope and headaches.  Hematological: Does not bruise/bleed easily.       Objective:   Physical Exam  amb bf spent most of the ov in fetal position on exam table with hopeless helpless affect  Wt Readings from Last 3 Encounters:  04/06/15 173 lb (78.472 kg)  03/28/15 165 lb (74.844 kg)  01/26/15 168 lb (76.204 kg)    Vital signs reviewed   HEENT: nl dentition, turbinates, and orophanx. Nl external ear canals without cough reflex   NECK :  without JVD/Nodes/TM/ nl carotid upstrokes bilaterally   LUNGS: no acc muscle use, clear to A and P bilaterally without cough on insp or exp maneuvers   CV:  RRR  no s3 or murmur or increase in P2, no edema   ABD:  soft and nontender with nl excursion in the supine position. No bruits or organomegaly, bowel sounds nl  MS:  warm without deformities, calf tenderness, cyanosis or clubbing - R calf in ace wrap not removed (f/u with primary later today)  SKIN: warm and dry without lesions    NEURO:  alert,  no deficits appeared both anxious and depressed            Assessment & Plan:

## 2015-04-06 NOTE — Patient Instructions (Addendum)
You have a tiny lung nodule that has nothing to do with your symptoms but does need follow up based on [employer] society guidelines   Change to pantoprazole 40 mg Take 30- 60 min before your first and last meals of the day   GERD (REFLUX)  is an extremely common cause of respiratory symptoms just like yours , many times with no obvious heartburn at all.    It can be treated with medication, but also with lifestyle changes including elevation of the head of your bed (ideally with 6 inch  bed blocks),  Smoking cessation, avoidance of late meals, excessive alcohol, and avoid fatty foods, chocolate, peppermint, colas, red wine, and acidic juices such as orange juice.  NO MINT OR MENTHOL PRODUCTS SO NO COUGH DROPS  USE SUGARLESS CANDY INSTEAD (Jolley ranchers or Stover's or Life Savers) or even ice chips will also do - the key is to swallow to prevent all throat clearing. NO OIL BASED VITAMINS - use powdered substitutes.    We will you  call after the first of the year to arrange a follow up CT chest to compare

## 2015-04-07 DIAGNOSIS — F17210 Nicotine dependence, cigarettes, uncomplicated: Secondary | ICD-10-CM | POA: Insufficient documentation

## 2015-04-07 DIAGNOSIS — R0600 Dyspnea, unspecified: Secondary | ICD-10-CM | POA: Insufficient documentation

## 2015-04-07 DIAGNOSIS — R058 Other specified cough: Secondary | ICD-10-CM | POA: Insufficient documentation

## 2015-04-07 DIAGNOSIS — R05 Cough: Secondary | ICD-10-CM | POA: Insufficient documentation

## 2015-04-07 NOTE — Assessment & Plan Note (Addendum)
The most common causes of chronic cough in immunocompetent adults include the following: upper airway cough syndrome (UACS), previously referred to as postnasal drip syndrome (PNDS), which is caused by variety of rhinosinus conditions; (2) asthma; (3) GERD; (4) chronic bronchitis from cigarette smoking or other inhaled environmental irritants; (5) nonasthmatic eosinophilic bronchitis; and (6) bronchiectasis.   These conditions, singly or in combination, have accounted for up to 94% of the causes of chronic cough in prospective studies.   Other conditions have constituted no >6% of the causes in prospective studies These have included bronchogenic carcinoma, chronic interstitial pneumonia, sarcoidosis, left ventricular failure, ACEI-induced cough, and aspiration from a condition associated with pharyngeal dysfunction.    Chronic cough is often simultaneously caused by more than one condition. A single cause has been found from 38 to 82% of the time, multiple causes from 18 to 62%. Multiply caused cough has been the result of three diseases up to 42% of the time.       Based on hx and exam, this is most likely:  Classic Upper airway cough syndrome, so named because it's frequently impossible to sort out how much is  CR/sinusitis with freq throat clearing (which can be related to primary GERD)   vs  causing  secondary (" extra esophageal")  GERD from wide swings in gastric pressure that occur with throat clearing, often  promoting self use of mint and menthol lozenges that reduce the lower esophageal sphincter tone and exacerbate the problem further in a cyclical fashion.   These are the same pts (now being labeled as having "irritable larynx syndrome" by some cough centers) who not infrequently have a history of having failed to tolerate ace inhibitors,  dry powder inhalers or biphosphonates or report having atypical reflux symptoms that don't respond to standard doses of PPI , and are easily confused as  having aecopd or asthma flares by even experienced allergists/ pulmonologists.   The first step is to maximize acid suppression and rx for non-acid gerd and then regroup if needed.  I had an extended discussion with the patient reviewing all relevant studies completed to date and  lasting 35 min of a 60 min ov   1)  first emphasize to her that the tiny nodule that we see on CT scan has nothing to do with her present symptoms  2) her long-term prognosis is far as I'm concerned is entirely related to whether or not she chooses to continue to smoke, please see separate discussion  3) I am concerned that she is on far too many medications that she admits she doesn't take the way she is supposed to from multiple sources and I've asked her from now on if at all possible to always come to visits with the medicine she actually takes in her hand so they can be verified   4)  Each maintenance medication was reviewed in detail including most importantly the difference between maintenance and prns and under what circumstances the prns are to be triggered using an action plan format that is not reflected in the computer generated alphabetically organized AVS.    Please see instructions for details which were reviewed in writing and the patient given a copy highlighting the part that I personally wrote and discussed at today's ov.   See instructions for specific recommendations which were reviewed directly with the patient who was given a copy with highlighter outlining the key components.

## 2015-04-07 NOTE — Assessment & Plan Note (Signed)
CT results reviewed with pt >>> Too small for PET or bx, not suspicious enough for excisional bx > really only option for now is follow the Fleischner society guidelines as rec by radiology.   Discussed in detail all the  indications, usual  risks and alternatives  relative to the benefits with patient who agrees to proceed with conservative f/u as outlined

## 2015-04-07 NOTE — Assessment & Plan Note (Signed)
04/06/2015   Walked RA x one lap @ 185 stopped due to  Dizzy and legs hurt, no sob or desat at nl pace   No evidence at all at this point that she actually is limited by her breathing

## 2015-04-07 NOTE — Assessment & Plan Note (Signed)
>   3 min discussion I reviewed the Fletcher curve with the patient that basically indicates  if you quit smoking when your best day FEV1 is still well preserved (as is likely the case here)  it is highly unlikely you will progress to severe disease and informed the patient there was no medication on the market that has proven to alter the curve/ its downward trajectory  or the likelihood of progression of their disease.  Therefore stopping smoking and maintaining abstinence is the most important aspect of care, not choice of inhalers or for that matter, doctors.    At this point I don't feel that she needs any form of pulmonary medication or follow-up.

## 2015-04-23 ENCOUNTER — Encounter (HOSPITAL_COMMUNITY): Payer: Self-pay | Admitting: *Deleted

## 2015-04-23 ENCOUNTER — Emergency Department (HOSPITAL_COMMUNITY)
Admission: EM | Admit: 2015-04-23 | Discharge: 2015-04-23 | Disposition: A | Discharge status: Home / Self Care | Payer: Medicare Other | Attending: Emergency Medicine | Admitting: Emergency Medicine

## 2015-04-23 ENCOUNTER — Emergency Department (HOSPITAL_COMMUNITY): Payer: Medicare Other

## 2015-04-23 DIAGNOSIS — Z720 Tobacco use: Secondary | ICD-10-CM | POA: Insufficient documentation

## 2015-04-23 DIAGNOSIS — M545 Low back pain, unspecified: Secondary | ICD-10-CM

## 2015-04-23 DIAGNOSIS — Z90710 Acquired absence of both cervix and uterus: Secondary | ICD-10-CM | POA: Diagnosis not present

## 2015-04-23 DIAGNOSIS — F429 Obsessive-compulsive disorder, unspecified: Secondary | ICD-10-CM | POA: Diagnosis not present

## 2015-04-23 DIAGNOSIS — Z79899 Other long term (current) drug therapy: Secondary | ICD-10-CM | POA: Insufficient documentation

## 2015-04-23 DIAGNOSIS — Z791 Long term (current) use of non-steroidal anti-inflammatories (NSAID): Secondary | ICD-10-CM | POA: Insufficient documentation

## 2015-04-23 DIAGNOSIS — F319 Bipolar disorder, unspecified: Secondary | ICD-10-CM | POA: Insufficient documentation

## 2015-04-23 DIAGNOSIS — M419 Scoliosis, unspecified: Secondary | ICD-10-CM | POA: Diagnosis not present

## 2015-04-23 DIAGNOSIS — Z8709 Personal history of other diseases of the respiratory system: Secondary | ICD-10-CM | POA: Diagnosis not present

## 2015-04-23 DIAGNOSIS — K219 Gastro-esophageal reflux disease without esophagitis: Secondary | ICD-10-CM | POA: Diagnosis not present

## 2015-04-23 DIAGNOSIS — M158 Other polyosteoarthritis: Secondary | ICD-10-CM | POA: Diagnosis not present

## 2015-04-23 DIAGNOSIS — Z86018 Personal history of other benign neoplasm: Secondary | ICD-10-CM | POA: Insufficient documentation

## 2015-04-23 DIAGNOSIS — F410 Panic disorder [episodic paroxysmal anxiety] without agoraphobia: Secondary | ICD-10-CM | POA: Insufficient documentation

## 2015-04-23 DIAGNOSIS — Z862 Personal history of diseases of the blood and blood-forming organs and certain disorders involving the immune mechanism: Secondary | ICD-10-CM | POA: Diagnosis not present

## 2015-04-23 DIAGNOSIS — Z79818 Long term (current) use of other agents affecting estrogen receptors and estrogen levels: Secondary | ICD-10-CM | POA: Insufficient documentation

## 2015-04-23 DIAGNOSIS — R109 Unspecified abdominal pain: Secondary | ICD-10-CM | POA: Insufficient documentation

## 2015-04-23 DIAGNOSIS — Z8669 Personal history of other diseases of the nervous system and sense organs: Secondary | ICD-10-CM | POA: Insufficient documentation

## 2015-04-23 LAB — CBC WITH DIFFERENTIAL/PLATELET
BASOS PCT: 0 %
Basophils Absolute: 0 10*3/uL (ref 0.0–0.1)
Eosinophils Absolute: 0.3 10*3/uL (ref 0.0–0.7)
Eosinophils Relative: 3 %
HEMATOCRIT: 31.9 % — AB (ref 36.0–46.0)
HEMOGLOBIN: 11.2 g/dL — AB (ref 12.0–15.0)
LYMPHS PCT: 40 %
Lymphs Abs: 3.4 10*3/uL (ref 0.7–4.0)
MCH: 29.9 pg (ref 26.0–34.0)
MCHC: 35.1 g/dL (ref 30.0–36.0)
MCV: 85.1 fL (ref 78.0–100.0)
MONO ABS: 0.9 10*3/uL (ref 0.1–1.0)
MONOS PCT: 10 %
NEUTROS ABS: 4.1 10*3/uL (ref 1.7–7.7)
NEUTROS PCT: 47 %
Platelets: 309 10*3/uL (ref 150–400)
RBC: 3.75 MIL/uL — ABNORMAL LOW (ref 3.87–5.11)
RDW: 12.9 % (ref 11.5–15.5)
WBC: 8.7 10*3/uL (ref 4.0–10.5)

## 2015-04-23 LAB — COMPREHENSIVE METABOLIC PANEL
ALBUMIN: 3.8 g/dL (ref 3.5–5.0)
ALK PHOS: 47 U/L (ref 38–126)
ALT: 14 U/L (ref 14–54)
AST: 15 U/L (ref 15–41)
Anion gap: 7 (ref 5–15)
BILIRUBIN TOTAL: 0.4 mg/dL (ref 0.3–1.2)
BUN: 19 mg/dL (ref 6–20)
CALCIUM: 9.1 mg/dL (ref 8.9–10.3)
CO2: 26 mmol/L (ref 22–32)
CREATININE: 0.99 mg/dL (ref 0.44–1.00)
Chloride: 105 mmol/L (ref 101–111)
GFR calc non Af Amer: >60 mL/min (ref >60)
GLUCOSE: 102 mg/dL — AB (ref 65–99)
Potassium: 4 mmol/L (ref 3.5–5.1)
SODIUM: 138 mmol/L (ref 135–145)
TOTAL PROTEIN: 6.8 g/dL (ref 6.5–8.1)

## 2015-04-23 LAB — LIPASE, BLOOD: Lipase: 28 U/L (ref 11–51)

## 2015-04-23 MED ORDER — HYDROCODONE-ACETAMINOPHEN 5-325 MG PO TABS: 1.0000 | ORAL_TABLET | Freq: Four times a day (QID) | ORAL | Status: DC | PRN

## 2015-04-23 MED ORDER — HYDROMORPHONE HCL 1 MG/ML IJ SOLN
1.0000 mg | Freq: Once | INTRAMUSCULAR | Status: AC
  Administered 2015-04-23: 1 mg via INTRAMUSCULAR
  Filled 2015-04-23: qty 1

## 2015-04-23 NOTE — Discharge Instructions (Signed)
Follow up with your family md  °

## 2015-04-23 NOTE — ED Notes (Signed)
Bed: WA25 Expected date:  Expected time:  Means of arrival:  Comments: 

## 2015-04-23 NOTE — ED Notes (Signed)
Pt states that she fell through a balcony on 10/9 and was seen at the ER; pt c/o that she is having continued symptoms; pt c/o lower back pain that radiates through both hips; pt c/o lower abd pain and states that she had recently had a hysterectomy before that; pt states that she was diagnosed with a concussion and continues to have dizziness and headache; pt c/o neck pain that radiates through her jaw; pt states that she was seen at [facility] on 10/31 for neck pain and abd pain and dizziness; pt states that she is just concerned and does not have a follow up until next few weeks with Concussion Specialist

## 2015-04-23 NOTE — ED Provider Notes (Signed)
CSN: [id number]     Arrival date & time 04/23/15  7628 History   First MD Initiated Contact with Patient 04/23/15 [phone]     Chief Complaint  Patient presents with  . Back Pain  . Abdominal Pain     (Consider location/radiation/quality/duration/timing/severity/associated sxs/prior Treatment) Patient is a 34 y.o. female presenting with back pain and abdominal pain. The history is provided by the patient (The patient states that she fell about a month ago. At that time she had a CT done of her head neck chest and abdomen which were unremarkable. Patient has appointment with concussion Dr. in a couple weeks. She is here for back pain. Lower back pain ).  Back Pain Location:  Lumbar spine Quality:  Aching Pain severity:  Moderate Pain is:  Same all the time Onset quality:  Gradual Timing:  Constant Chronicity:  Recurrent Context: not emotional stress   Associated symptoms: abdominal pain   Associated symptoms: no chest pain and no headaches   Abdominal Pain Associated symptoms: no chest pain, no cough, no diarrhea, no fatigue and no hematuria     Past Medical History  Diagnosis Date  . Sickle cell trait ([place])   . Borderline diabetes     no meds  . Scoliosis   . Hernia, umbilical   . Depression   . Anxiety   . Panic attack   . OCD (obsessive compulsive disorder)   . Fibromyalgia   . Carpal tunnel syndrome on both sides   . Fibroid, uterine   . GERD (gastroesophageal reflux disease)   . Bipolar disorder ([place])   . Hx of bronchitis   . Headache(784.0)     otc meds prn  . Arthritis     lower back, hands, feet  . Anemia     history  . Umbilical hernia    Past Surgical History  Procedure Laterality Date  . Wisdom tooth extraction    . Back surgery      2 rods in back -scoliosis  . Laparoscopy N/A 10/29/2013    Procedure: LAPAROSCOPY DIAGNOSTIC;  Surgeon: Donnamae Jude, MD;  Location: [facility];  Service: Gynecology;  Laterality: N/A;  . Abdominal hysterectomy N/A 02/10/2014     Procedure: HYSTERECTOMY ABDOMINAL;  Surgeon: Donnamae Jude, MD;  Location: [facility];  Service: Gynecology;  Laterality: N/A;  . Salpingoophorectomy Bilateral 02/10/2014    Procedure: SALPINGO OOPHORECTOMY;  Surgeon: Donnamae Jude, MD;  Location: [facility];  Service: Gynecology;  Laterality: Bilateral;  . Umbilical hernia repair N/A 02/10/2014    Procedure: HERNIA REPAIR UMBILICAL ADULT;  Surgeon: Donnamae Jude, MD;  Location: [facility];  Service: Gynecology;  Laterality: N/A;   Family History  Problem Relation Age of Onset  . Hypertension Mother   . Diabetes Mother   . Fibromyalgia Mother   . Stroke Mother   . Cancer Maternal Grandmother   . Asthma Cousin    Social History  Substance Use Topics  . Smoking status: Current Every Day Smoker -- 0.50 packs/day for 21 years    Types: Cigarettes  . Smokeless tobacco: Never Used  . Alcohol Use: 0.0 oz/week    0 Standard drinks or equivalent per week   OB History    Gravida Para Term Preterm AB TAB SAB Ectopic Multiple Living   0              Review of Systems  Constitutional: Negative for appetite change and fatigue.  HENT: Negative for congestion, ear discharge and  sinus pressure.   Eyes: Negative for discharge.  Respiratory: Negative for cough.   Cardiovascular: Negative for chest pain.  Gastrointestinal: Positive for abdominal pain. Negative for diarrhea.  Genitourinary: Negative for frequency and hematuria.  Musculoskeletal: Positive for back pain.  Skin: Negative for rash.  Neurological: Negative for seizures and headaches.  Psychiatric/Behavioral: Negative for hallucinations.      Allergies  Tramadol hcl  Home Medications   Prior to Admission medications   Medication Sig Start Date End Date Taking? Authorizing Provider  albuterol (PROVENTIL HFA;VENTOLIN HFA) 108 (90 BASE) MCG/ACT inhaler Inhale 1 puff into the lungs every 6 (six) hours as needed for wheezing or shortness of breath. 03/29/15  Yes Gareth Morgan, MD   estradiol (ESTRACE) 0.5 MG tablet Take 1 tablet (0.5 mg total) by mouth daily. 02/16/15  Yes Osborne Oman, MD  fexofenadine (ALLEGRA) 180 MG tablet Take 180 mg by mouth daily.   Yes Historical Provider, MD  ibuprofen (ADVIL,MOTRIN) 800 MG tablet Take 800 mg by mouth every 6 (six) hours as needed for moderate pain.  08/23/14  Yes Historical Provider, MD  LYRICA 150 MG capsule Take 150 mg by mouth daily. 03/31/15  Yes Historical Provider, MD  meloxicam (MOBIC) 15 MG tablet Take 15 mg by mouth daily. 11/23/14  Yes Historical Provider, MD  mirtazapine (REMERON) 15 MG tablet Take 15 mg by mouth at bedtime.   Yes Historical Provider, MD  pantoprazole (PROTONIX) 40 MG tablet Take 1 tablet (40 mg total) by mouth daily. 04/06/15  Yes Tanda Rockers, MD  tiZANidine (ZANAFLEX) 2 MG tablet Take 2 mg by mouth 2 (two) times daily as needed for muscle spasms.  11/23/14  Yes Historical Provider, MD  Vitamin D, Ergocalciferol, (DRISDOL) 50000 UNITS CAPS capsule Take 50,000 Units by mouth every 7 (seven) days. 04/09/15  Yes Historical Provider, MD  HYDROcodone-acetaminophen (NORCO/VICODIN) 5-325 MG tablet Take 1 tablet by mouth every 6 (six) hours as needed for moderate pain. 04/23/15   Milton Ferguson, MD  LORazepam (ATIVAN) 1 MG tablet Take 1 tablet (1 mg total) by mouth 3 (three) times daily as needed for anxiety. Patient not taking: Reported on 04/23/2015 07/05/14   Delos Haring, PA-C   BP 100/62 mmHg  Pulse 62  Temp(Src) 97.5 F (36.4 C) (Oral)  Resp 16  SpO2 99%  LMP 12/31/2012 Physical Exam  Constitutional: She is oriented to person, place, and time. She appears well-developed.  HENT:  Head: Normocephalic.  Eyes: Conjunctivae and EOM are normal. No scleral icterus.  Neck: Neck supple. No thyromegaly present.  Cardiovascular: Normal rate and regular rhythm.  Exam reveals no gallop and no friction rub.   No murmur heard. Pulmonary/Chest: No stridor. She has no wheezes. She has no rales. She exhibits no  tenderness.  Abdominal: She exhibits no distension. There is tenderness. There is no rebound.  Mild abd soreness  Musculoskeletal: Normal range of motion. She exhibits no edema.  Mild lumbar tender  Lymphadenopathy:    She has no cervical adenopathy.  Neurological: She is oriented to person, place, and time. She exhibits normal muscle tone. Coordination normal.  Skin: No rash noted. No erythema.  Psychiatric: She has a normal mood and affect. Her behavior is normal.    ED Course  Procedures (including critical care time) Labs Review Labs Reviewed  CBC WITH DIFFERENTIAL/PLATELET - Abnormal; Notable for the following:    RBC 3.75 (*)    Hemoglobin 11.2 (*)    HCT 31.9 (*)    All  other components within normal limits  COMPREHENSIVE METABOLIC PANEL - Abnormal; Notable for the following:    Glucose, Bld 102 (*)    All other components within normal limits  LIPASE, BLOOD    Imaging Review Dg Abd Acute W/chest  04/23/2015  CLINICAL DATA:  Flank pain, back pain, nausea and shortness of breath. Dramatic injury on 03/28/2015 with evaluation at that time demonstrating no evidence of acute injuries. Subsequent encounter. EXAM: DG ABDOMEN ACUTE W/ 1V CHEST COMPARISON:  None. FINDINGS: There is no evidence of pulmonary edema, consolidation, pneumothorax, nodule or pleural fluid. The heart size and mediastinal contours are normal. Thoracic spinal fusion rods are present. Abdominal films show a nonobstructive bowel gas pattern. No free air identified. No bony fracture seen. Moderate fecal material seen throughout the colon. No abnormal calcifications or soft tissue abnormalities. No abnormal soft tissue foreign body. IMPRESSION: Negative abdominal radiographs.  No acute cardiopulmonary disease. Electronically Signed   By: Aletta Edouard M.D.   On: 04/23/2015 08:26   I have personally reviewed and evaluated these images and lab results as part of my medical decision-making.   EKG  Interpretation None      MDM   Final diagnoses:  Back pain at L4-L5 level    Labs and x-rays unremarkable. Patient has muscle soreness and contusion from fall month ago. CT of head neck chest and abdomen were negative then. Patient has run out of her hydrocodone. I will refill some more pain medicine for her. She is to follow-up with her family doctor and a concussion Dr.    Milton Ferguson, MD 04/23/15 1104

## 2015-05-08 ENCOUNTER — Encounter (HOSPITAL_COMMUNITY): Payer: Self-pay | Admitting: Emergency Medicine

## 2015-05-08 ENCOUNTER — Emergency Department (HOSPITAL_COMMUNITY): Payer: Medicare Other

## 2015-05-08 ENCOUNTER — Emergency Department (HOSPITAL_COMMUNITY)
Admission: EM | Admit: 2015-05-08 | Discharge: 2015-05-08 | Disposition: A | Discharge status: Home / Self Care | Payer: Medicare Other | Attending: Emergency Medicine | Admitting: Emergency Medicine

## 2015-05-08 DIAGNOSIS — Y999 Unspecified external cause status: Secondary | ICD-10-CM | POA: Diagnosis not present

## 2015-05-08 DIAGNOSIS — S0990XA Unspecified injury of head, initial encounter: Secondary | ICD-10-CM | POA: Diagnosis not present

## 2015-05-08 DIAGNOSIS — Z862 Personal history of diseases of the blood and blood-forming organs and certain disorders involving the immune mechanism: Secondary | ICD-10-CM | POA: Insufficient documentation

## 2015-05-08 DIAGNOSIS — Y92009 Unspecified place in unspecified non-institutional (private) residence as the place of occurrence of the external cause: Secondary | ICD-10-CM | POA: Insufficient documentation

## 2015-05-08 DIAGNOSIS — S79912A Unspecified injury of left hip, initial encounter: Secondary | ICD-10-CM | POA: Diagnosis not present

## 2015-05-08 DIAGNOSIS — W19XXXA Unspecified fall, initial encounter: Secondary | ICD-10-CM

## 2015-05-08 DIAGNOSIS — F329 Major depressive disorder, single episode, unspecified: Secondary | ICD-10-CM | POA: Diagnosis not present

## 2015-05-08 DIAGNOSIS — F410 Panic disorder [episodic paroxysmal anxiety] without agoraphobia: Secondary | ICD-10-CM | POA: Insufficient documentation

## 2015-05-08 DIAGNOSIS — M19071 Primary osteoarthritis, right ankle and foot: Secondary | ICD-10-CM | POA: Diagnosis not present

## 2015-05-08 DIAGNOSIS — W010XXA Fall on same level from slipping, tripping and stumbling without subsequent striking against object, initial encounter: Secondary | ICD-10-CM | POA: Insufficient documentation

## 2015-05-08 DIAGNOSIS — F429 Obsessive-compulsive disorder, unspecified: Secondary | ICD-10-CM | POA: Insufficient documentation

## 2015-05-08 DIAGNOSIS — Z86018 Personal history of other benign neoplasm: Secondary | ICD-10-CM | POA: Insufficient documentation

## 2015-05-08 DIAGNOSIS — F17210 Nicotine dependence, cigarettes, uncomplicated: Secondary | ICD-10-CM | POA: Diagnosis not present

## 2015-05-08 DIAGNOSIS — S79911A Unspecified injury of right hip, initial encounter: Secondary | ICD-10-CM | POA: Insufficient documentation

## 2015-05-08 DIAGNOSIS — M19042 Primary osteoarthritis, left hand: Secondary | ICD-10-CM | POA: Diagnosis not present

## 2015-05-08 DIAGNOSIS — S199XXA Unspecified injury of neck, initial encounter: Secondary | ICD-10-CM | POA: Insufficient documentation

## 2015-05-08 DIAGNOSIS — K219 Gastro-esophageal reflux disease without esophagitis: Secondary | ICD-10-CM | POA: Diagnosis not present

## 2015-05-08 DIAGNOSIS — M19072 Primary osteoarthritis, left ankle and foot: Secondary | ICD-10-CM | POA: Insufficient documentation

## 2015-05-08 DIAGNOSIS — Y9389 Activity, other specified: Secondary | ICD-10-CM | POA: Insufficient documentation

## 2015-05-08 DIAGNOSIS — M19041 Primary osteoarthritis, right hand: Secondary | ICD-10-CM | POA: Diagnosis not present

## 2015-05-08 DIAGNOSIS — M47816 Spondylosis without myelopathy or radiculopathy, lumbar region: Secondary | ICD-10-CM | POA: Diagnosis not present

## 2015-05-08 DIAGNOSIS — Z8669 Personal history of other diseases of the nervous system and sense organs: Secondary | ICD-10-CM | POA: Insufficient documentation

## 2015-05-08 DIAGNOSIS — S79921A Unspecified injury of right thigh, initial encounter: Secondary | ICD-10-CM | POA: Insufficient documentation

## 2015-05-08 MED ORDER — KETOROLAC TROMETHAMINE 60 MG/2ML IM SOLN
60.0000 mg | Freq: Once | INTRAMUSCULAR | Status: AC
  Administered 2015-05-08: 60 mg via INTRAMUSCULAR
  Filled 2015-05-08: qty 2

## 2015-05-08 NOTE — Discharge Instructions (Signed)
Kristen [name], Monty meeting you. You can take 800 mg ibuprofen three times daily with meals for pain. Please follow-up with your primary care provider within one week. Return to the emergency department if your pain increases, if you become unable to walk, or if you lose control of your bladder/bowel function. Feel better soon.   Farrel Gordon, PA-C

## 2015-05-08 NOTE — ED Notes (Signed)
Pt ambulatory to restroom with 1 staff assist.

## 2015-05-08 NOTE — ED Notes (Signed)
Pt here via EMS from home. Pt reports she was using restroom and got dizzy and fell. Pt reports unknown LOC. Pt sts hit her face and neck on the right side. Pt has hx of chronic pain. Pt wearing neck brace from a fall approx a month ago where a friends balcony collapsed while she was standing on it. Pt sts pain in face 10/10, blurred vision since today.

## 2015-05-18 ENCOUNTER — Other Ambulatory Visit: Payer: Self-pay | Admitting: Internal Medicine

## 2015-05-18 ENCOUNTER — Ambulatory Visit
Payer: Medicare Other | Attending: Physical Medicine and Rehabilitation | Admitting: Rehabilitative and Restorative Service Providers"

## 2015-05-18 DIAGNOSIS — R42 Dizziness and giddiness: Secondary | ICD-10-CM | POA: Insufficient documentation

## 2015-05-18 DIAGNOSIS — R269 Unspecified abnormalities of gait and mobility: Secondary | ICD-10-CM | POA: Diagnosis present

## 2015-05-18 DIAGNOSIS — R911 Solitary pulmonary nodule: Secondary | ICD-10-CM

## 2015-05-18 DIAGNOSIS — M542 Cervicalgia: Secondary | ICD-10-CM | POA: Diagnosis present

## 2015-05-18 DIAGNOSIS — R293 Abnormal posture: Secondary | ICD-10-CM | POA: Diagnosis present

## 2015-05-18 NOTE — Patient Instructions (Signed)
1) As long as your doctor told you to decrease use of your neck brace, you can decrease your wear time.  WEARING SCHEDULE: Try to remain up without neck brace x 1 hour at a time 3 times during the day. As you tolerate that, increase time without neck brace to 1.5-2 hours as able.   2)  AROM: Neck Rotation    LYING DOWN WITH ONE PILLOW.  Turn head slowly to look over one shoulder *WITHIN A TOLERABLE RANGE OF MOTION, then the other. Hold each position __3__ seconds.  As this gets easier, can do this while sittin upright. Repeat __5-10__ times per set. Do __1__ sets per session. Do _2___ sessions per day.  http://orth.exer.us/295   Copyright  VHI. All rights reserved.
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
## 2015-05-20 NOTE — ED Provider Notes (Signed)
CSN: [id number]     Arrival date & time 05/08/15  1244 History   First MD Initiated Contact with Patient 05/08/15 1255     Chief Complaint  Patient presents with  . Fall   HPI  Kristen [name] is a 34 y.o. F PMH significant for fibromyalgia, bipolar disorder, arthritis presenting s/p fall with right head and neck pain, bilateral hip and right leg pain. She states she became dizzy, slipped and fell at home this morning, with possible LOC (unsure of amount of time) afterwards. She describes her pain as 10/10 pain scale, constant, non-radiating, sharp. She endorses blurred vision. No fevers, chills, CP, abdominal pain, nausea, vomiting, changes in bladder/bowel function, saddle paresthesias.   Past Medical History  Diagnosis Date  . Sickle cell trait ([place])   . Borderline diabetes     no meds  . Scoliosis   . Hernia, umbilical   . Depression   . Anxiety   . Panic attack   . OCD (obsessive compulsive disorder)   . Fibromyalgia   . Carpal tunnel syndrome on both sides   . Fibroid, uterine   . GERD (gastroesophageal reflux disease)   . Bipolar disorder ([place])   . Hx of bronchitis   . Headache(784.0)     otc meds prn  . Arthritis     lower back, hands, feet  . Anemia     history  . Umbilical hernia    Past Surgical History  Procedure Laterality Date  . Wisdom tooth extraction    . Back surgery      2 rods in back -scoliosis  . Laparoscopy N/A 10/29/2013    Procedure: LAPAROSCOPY DIAGNOSTIC;  Surgeon: Donnamae Jude, MD;  Location: [facility];  Service: Gynecology;  Laterality: N/A;  . Abdominal hysterectomy N/A 02/10/2014    Procedure: HYSTERECTOMY ABDOMINAL;  Surgeon: Donnamae Jude, MD;  Location: [facility];  Service: Gynecology;  Laterality: N/A;  . Salpingoophorectomy Bilateral 02/10/2014    Procedure: SALPINGO OOPHORECTOMY;  Surgeon: Donnamae Jude, MD;  Location: [facility];  Service: Gynecology;  Laterality: Bilateral;  . Umbilical hernia repair N/A 02/10/2014    Procedure: HERNIA  REPAIR UMBILICAL ADULT;  Surgeon: Donnamae Jude, MD;  Location: [facility];  Service: Gynecology;  Laterality: N/A;   Family History  Problem Relation Age of Onset  . Hypertension Mother   . Diabetes Mother   . Fibromyalgia Mother   . Stroke Mother   . Cancer Maternal Grandmother   . Asthma Cousin    Social History  Substance Use Topics  . Smoking status: Current Every Day Smoker -- 0.50 packs/day for 21 years    Types: Cigarettes  . Smokeless tobacco: Never Used  . Alcohol Use: 0.0 oz/week    0 Standard drinks or equivalent per week     Comment: occasional   OB History    Gravida Para Term Preterm AB TAB SAB Ectopic Multiple Living   0              Review of Systems  Ten systems are reviewed and are negative for acute change except as noted in the HPI   Allergies  Tramadol hcl  Home Medications   Prior to Admission medications   Medication Sig Start Date End Date Taking? Authorizing Provider  albuterol (PROVENTIL HFA;VENTOLIN HFA) 108 (90 BASE) MCG/ACT inhaler Inhale 1 puff into the lungs every 6 (six) hours as needed for wheezing or shortness of breath. 03/29/15  Yes Gareth Morgan, MD  busPIRone (BUSPAR) 10 MG tablet Take 10 mg by mouth 2 (two) times daily.   Yes Historical Provider, MD  estradiol (ESTRACE) 0.5 MG tablet Take 1 tablet (0.5 mg total) by mouth daily. 02/16/15  Yes Osborne Oman, MD  fexofenadine (ALLEGRA) 180 MG tablet Take 180 mg by mouth daily.   Yes Historical Provider, MD  HYDROcodone-acetaminophen (NORCO/VICODIN) 5-325 MG tablet Take 1 tablet by mouth every 6 (six) hours as needed for moderate pain. 04/23/15  Yes Milton Ferguson, MD  ibuprofen (ADVIL,MOTRIN) 800 MG tablet Take 800 mg by mouth every 6 (six) hours as needed for moderate pain.  08/23/14  Yes Historical Provider, MD  LYRICA 150 MG capsule Take 150 mg by mouth daily. 03/31/15  Yes Historical Provider, MD  mirtazapine (REMERON) 15 MG tablet Take 15 mg by mouth at bedtime.   Yes Historical  Provider, MD  pantoprazole (PROTONIX) 40 MG tablet Take 1 tablet (40 mg total) by mouth daily. 04/06/15  Yes Tanda Rockers, MD  tiZANidine (ZANAFLEX) 2 MG tablet Take 2 mg by mouth 2 (two) times daily as needed for muscle spasms.  11/23/14  Yes Historical Provider, MD  Vitamin D, Ergocalciferol, (DRISDOL) 50000 UNITS CAPS capsule Take 50,000 Units by mouth every 7 (seven) days. 04/09/15  Yes Historical Provider, MD   BP 123/73 mmHg  Pulse 62  Temp(Src) 99.3 F (37.4 C) (Oral)  Resp 18  Ht 5' 6.5" (1.689 m)  Wt 77.111 kg  BMI 27.03 kg/m2  SpO2 100%  LMP 12/31/2012 Physical Exam  Constitutional: She appears well-developed and well-nourished. No distress.  HENT:  Head: Normocephalic and atraumatic.  Right Ear: External ear normal.  Left Ear: External ear normal.  Nose: Nose normal.  Mouth/Throat: Oropharynx is clear and moist. No oropharyngeal exudate.  Without hemotympanum   Eyes: Conjunctivae and EOM are normal. Pupils are equal, round, and reactive to light. Right eye exhibits no discharge. Left eye exhibits no discharge. No scleral icterus.  Neck: No tracheal deviation present.  Neck brace in place from fall 1 month prior. Diffuse tenderness along entire cervical spine.   Cardiovascular: Normal rate, regular rhythm, normal heart sounds and intact distal pulses.  Exam reveals no gallop and no friction rub.   No murmur heard. Pulmonary/Chest: Effort normal and breath sounds normal. No respiratory distress. She has no wheezes. She has no rales. She exhibits no tenderness.  Abdominal: Soft. Bowel sounds are normal. She exhibits no distension and no mass. There is no tenderness. There is no rebound and no guarding.  Musculoskeletal: Normal range of motion. She exhibits tenderness. She exhibits no edema.  Tenderness along bilateral hips and right femur.   Lymphadenopathy:    She has no cervical adenopathy.  Neurological: She is alert. No cranial nerve deficit. Coordination normal.   Skin: Skin is warm and dry. No rash noted. She is not diaphoretic. No erythema.  Psychiatric: She has a normal mood and affect. Her behavior is normal.  Nursing note and vitals reviewed.   ED Course  Procedures   Imaging Review Ct Head Wo Contrast  05/08/2015  CLINICAL DATA:  Fall.  Pain. EXAM: CT HEAD WITHOUT CONTRAST CT CERVICAL SPINE WITHOUT CONTRAST TECHNIQUE: Multidetector CT imaging of the head and cervical spine was performed following the standard protocol without intravenous contrast. Multiplanar CT image reconstructions of the cervical spine were also generated. COMPARISON:  03/28/2015 FINDINGS: CT HEAD FINDINGS Sinuses/Soft tissues: No significant soft tissue swelling. No skull fracture. Clear paranasal sinuses and mastoid air cells.  Intracranial: No mass lesion, hemorrhage, hydrocephalus, acute infarct, intra-axial, or extra-axial fluid collection. CT CERVICAL SPINE FINDINGS Spinal visualization through the bottom of T1. Upper thoracic hardware, incompletely imaged. Prevertebral soft tissues are within normal limits. No apical pneumothorax. Skull base intact. Maintenance of vertebral body height. Straightening and mild reversal of expected cervical lordosis is similar. Reversal centered about approximately C5-6. Facets are well-aligned. Coronal reformats demonstrate a normal C1-C2 articulation. Convex right cervical spine curvature. IMPRESSION: 1. Normal head CT. 2. No acute fracture or subluxation in the cervical spine. Straightening and reversal of cervical lordosis could be positional, due to muscular spasm, or ligamentous injury. Given convex right spinal curvature and prior surgery, this may be within normal variation in this patient. Electronically Signed   By: Abigail Miyamoto M.D.   On: 05/08/2015 14:57   Ct Cervical Spine Wo Contrast  05/08/2015  CLINICAL DATA:  Fall.  Pain. EXAM: CT HEAD WITHOUT CONTRAST CT CERVICAL SPINE WITHOUT CONTRAST TECHNIQUE: Multidetector CT imaging of the  head and cervical spine was performed following the standard protocol without intravenous contrast. Multiplanar CT image reconstructions of the cervical spine were also generated. COMPARISON:  03/28/2015 FINDINGS: CT HEAD FINDINGS Sinuses/Soft tissues: No significant soft tissue swelling. No skull fracture. Clear paranasal sinuses and mastoid air cells. Intracranial: No mass lesion, hemorrhage, hydrocephalus, acute infarct, intra-axial, or extra-axial fluid collection. CT CERVICAL SPINE FINDINGS Spinal visualization through the bottom of T1. Upper thoracic hardware, incompletely imaged. Prevertebral soft tissues are within normal limits. No apical pneumothorax. Skull base intact. Maintenance of vertebral body height. Straightening and mild reversal of expected cervical lordosis is similar. Reversal centered about approximately C5-6. Facets are well-aligned. Coronal reformats demonstrate a normal C1-C2 articulation. Convex right cervical spine curvature. IMPRESSION: 1. Normal head CT. 2. No acute fracture or subluxation in the cervical spine. Straightening and reversal of cervical lordosis could be positional, due to muscular spasm, or ligamentous injury. Given convex right spinal curvature and prior surgery, this may be within normal variation in this patient. Electronically Signed   By: Abigail Miyamoto M.D.   On: 05/08/2015 14:57   Dg Hip Unilat With Pelvis 2-3 Views Left  05/08/2015  CLINICAL DATA:  Fall EXAM: DG HIP (WITH OR WITHOUT PELVIS) 2-3V LEFT COMPARISON:  None. FINDINGS: There is no evidence of hip fracture or dislocation. There is no evidence of arthropathy or other focal bone abnormality. IMPRESSION: Negative. Electronically Signed   By: Ilona Sorrel M.D.   On: 05/08/2015 15:58   Dg Femur, Min 2 Views Right  05/08/2015  CLINICAL DATA:  Fall with proximal right lower extremity pain. EXAM: RIGHT FEMUR 2 VIEWS COMPARISON:  None. FINDINGS: No fracture or suspicious focal osseous lesion. No evidence of  malalignment at the right hip or right knee. Small right inferior patellar enthesophyte. Unremarkable soft tissues. IMPRESSION: No right femur fracture. Electronically Signed   By: Ilona Sorrel M.D.   On: 05/08/2015 16:00   I have personally reviewed and evaluated these images and lab results as part of my medical decision-making.   MDM   Final diagnoses:  Fall, initial encounter   Patient non-toxic appearing and VSS. Although the patient blames her fall on dizziness, it seems to have been more of a mechanical fall. Will CT head based on [place] Head CT injury rules (LOC for unknown amount of time), and c-spine as well since she demonstrates tenderness there as well. Although her hip and leg pain appears chronic, based on her mechanism of injury, will xray those  areas.  CT c-spine revealed straightening and reversal of cervical lordosis which could be positional, due to muscular spasm, or ligamentous injury. Given convex right spinal curvature and prior surgery, this may be within normal variation in this patient. After reviewing the images, it appears patient was in her c-collar for the images.   She was seen here on 11/4, and was given 30 tablets of Norco. She was instructed to keep her appointment with the concussion specialist.   RN informed me patient ambulated to restroom without distress.   Patient may be safely discharged home. She may take high dose ibuprofen and her remaining Norco for her pain. Discussed reasons for return. Patient to follow-up with primary care provider within one week. Patient advised to keep concussion specialist appt. Patient in understanding and agreement with the plan.   Mendota Lions, PA-C 05/20/15 1835  Leonard Schwartz, MD 05/24/15 [phone]

## 2015-05-24 ENCOUNTER — Ambulatory Visit: Payer: Medicare Other | Admitting: Rehabilitative and Restorative Service Providers"

## 2015-05-25 ENCOUNTER — Encounter: Payer: Medicare Other | Admitting: Rehabilitative and Restorative Service Providers"

## 2015-05-27 ENCOUNTER — Ambulatory Visit
Payer: Medicare Other | Attending: Physical Medicine and Rehabilitation | Admitting: Rehabilitative and Restorative Service Providers"

## 2015-05-27 DIAGNOSIS — R293 Abnormal posture: Secondary | ICD-10-CM | POA: Insufficient documentation

## 2015-05-27 DIAGNOSIS — M542 Cervicalgia: Secondary | ICD-10-CM | POA: Insufficient documentation

## 2015-05-27 DIAGNOSIS — R42 Dizziness and giddiness: Secondary | ICD-10-CM | POA: Insufficient documentation

## 2015-05-27 DIAGNOSIS — R269 Unspecified abnormalities of gait and mobility: Secondary | ICD-10-CM | POA: Diagnosis present

## 2015-05-27 NOTE — Therapy (Signed)
[facility] [address] [place], [place], [address] Phone: [phone]   Fax:  [phone]  Physical Therapy Treatment  Patient Details  Name: Kristen [name] MRN: [id number] Date of Birth: [birth year] Referring Provider: Dr. Hinton Rao  Encounter Date: 05/27/2015      PT End of Session - 05/27/15 0930    Visit Number 2   Number of Visits 16   Date for PT Re-Evaluation 07/16/15   Authorization Type G code every 10th visit   PT Start Time 0850   PT Stop Time 0932   PT Time Calculation (min) 42 min   Activity Tolerance Patient limited by pain   Behavior During Therapy Anxious      Past Medical History  Diagnosis Date  . Sickle cell trait ([place])   . Borderline diabetes     no meds  . Scoliosis   . Hernia, umbilical   . Depression   . Anxiety   . Panic attack   . OCD (obsessive compulsive disorder)   . Fibromyalgia   . Carpal tunnel syndrome on both sides   . Fibroid, uterine   . GERD (gastroesophageal reflux disease)   . Bipolar disorder ([place])   . Hx of bronchitis   . Headache(784.0)     otc meds prn  . Arthritis     lower back, hands, feet  . Anemia     history  . Umbilical hernia     Past Surgical History  Procedure Laterality Date  . Wisdom tooth extraction    . Back surgery      2 rods in back -scoliosis  . Laparoscopy N/A 10/29/2013    Procedure: LAPAROSCOPY DIAGNOSTIC;  Surgeon: Donnamae Jude, MD;  Location: [facility];  Service: Gynecology;  Laterality: N/A;  . Abdominal hysterectomy N/A 02/10/2014    Procedure: HYSTERECTOMY ABDOMINAL;  Surgeon: Donnamae Jude, MD;  Location: [facility];  Service: Gynecology;  Laterality: N/A;  . Salpingoophorectomy Bilateral 02/10/2014    Procedure: SALPINGO OOPHORECTOMY;  Surgeon: Donnamae Jude, MD;  Location: [facility];  Service: Gynecology;  Laterality: Bilateral;  . Umbilical hernia repair N/A 02/10/2014    Procedure: HERNIA REPAIR UMBILICAL ADULT;  Surgeon:  Donnamae Jude, MD;  Location: [facility];  Service: Gynecology;  Laterality: N/A;    There were no vitals filed for this visit.  Visit Diagnosis:  Neck pain  Postural instability      Subjective Assessment - 05/27/15 0855    Subjective The patient reports she is still dizziness, nauseous and out of breath frequently throughout the day.  She feels dizziness worse in mornings "it comes and goes all day still".     Currently in Pain? Yes   Pain Score 10-Worst pain ever   Pain Location Neck  back of head and nauseous   Pain Descriptors / Indicators Aching;Stabbing;Shooting;Sharp   Pain Type Acute pain   Pain Onset More than a month ago   Pain Frequency Constant   Aggravating Factors  light/hypersensitive   Pain Relieving Factors unsure      NEUROMUSCULAR RE-EDUCATION: Seated visual tracking activities with saccades moving to targets in horiz/vert/diagonal plane x 5 reps each with dizziness and needing rest in between each repetition. Seated gaze x 1 viewing x cues for slow and small movements due to sensations of dizziness  THERAPEUTIC EXERCISE: Seated A/ROM neck rotation x 3 reps each direction Shoulder rolls x 10 reps Shoulder shrugs x 10 reps supine lumbar rocking Supine hamstring  stretching   MANUAL: Soft tissue mobilization for suboccipitals Passive overpressure into neck rotation and sidebending within patient's tolerance *used a towel to cover patient's eyes due to sensitivity to light and in a busier gym environment today gentle manual distraction supine within tolerable range      Vestibular Assessment - 05/27/15 0905    Occulomotor Exam   Comment patient continues with eyes closed during visual tracking, as we performed treatment, she was able to open her eyes more.   Vestibulo-Occular Reflex   VOR 1 Head Only (x 1 viewing) unable to tolerate              PT Education - 05/27/15 1152    Education provided Yes   Education Details HEP: shoulder rolls,  trunk rotation, saccadic eye movements   Person(s) Educated Patient   Methods Explanation;Demonstration;Handout   Comprehension Verbalized understanding;Returned demonstration;Need further instruction;Verbal cues required          PT Short Term Goals - 05/19/15 0837    PT SHORT TERM GOAL #1   Title The patient will return demo HEP for neck A/ROM, gentle stretching for posture, and dizziness as tolerated.   Baseline Target date 06/16/2015   Time 4   Period Weeks   PT SHORT TERM GOAL #2   Title The patient will improve neck A/ROM supine to 20 degrees bilateral cervical rotation.   Baseline Target date 06/16/2015   Time 4   Period Weeks   PT SHORT TERM GOAL #3   Title The patient will tolerate neck A/ROM for further assessment for flexion/extension and sidebending.   Baseline Target date 06/16/2015   Time 4   Period Weeks   PT SHORT TERM GOAL #4   Title The patient will improve gait speed from 1.28 ft/sec to > or equal to 1.8 ft/sec to demo improving functional mobility.   Baseline Target date 06/16/2015   Time 4   Period Weeks   PT SHORT TERM GOAL #5   Title The patient will report decreased reliance on cervical collar to improve postural stability (wearing <50% of time out of bed).   Baseline Target date 06/16/2015   Time 4   Period Weeks   Additional Short Term Goals   Additional Short Term Goals Yes   PT SHORT TERM GOAL #6   Title Further assess vertigo as patient neck ROM and behaviorally will tolerate.   Baseline Target date 06/16/2015   Time 4   Period Weeks           PT Long Term Goals - 05/18/15 0839    PT LONG TERM GOAL #1   Title The patient will be independent with progression of HEP.   Baseline Target date 07/16/2015   Time 8   Period Weeks   PT LONG TERM GOAL #2   Title The patient will improve gait speed from 1.28 ft/sec to > or equal to 2.2 ft/sec to demo improving mobility.   Baseline Target date 07/16/2015   Time 8   Period Weeks   PT LONG TERM  GOAL #3   Title The patient will improve functional status score from 12% to > or equal to 30% to demo decreased self perception of disability.   Baseline Target date 07/16/2015   Time 8   Period Weeks   PT LONG TERM GOAL #4   Title The patient will report dizziness 4/10 at baseline (8/10 on eval)..   Baseline Target date 07/16/2015   Time 8   Period  Weeks   PT LONG TERM GOAL #5   Title Improve A/ROM cervical spine to > or equal to 35 degrees bilaterally.   Baseline Target date 07/16/2015   Time 8   Period Weeks   Additional Long Term Goals   Additional Long Term Goals Yes   PT LONG TERM GOAL #6   Title Report decreased pain from HA from 10/10 down to < or equal to 6/10.   Baseline Target date 07/16/2015   Time 8   Period Weeks               Plan - 05/27/15 1154    Clinical Impression Statement The patient is tolerating minimal head movement today and oculomotor activities initiated to improve tolerance to head and eye movements.   PT Next Visit Plan Check A/ROM neck exercise, add further stretching to HEP per patient tolerance  MEASURE neck A/ROM as tolerated and ASSESS vestibular system (oculomotor, gaze and positional testing) to tolerance.        Problem List Patient Active Problem List   Diagnosis Date Noted  . Cigarette smoker 04/07/2015  . Upper airway cough syndrome 04/07/2015  . Dyspnea 04/07/2015  . Solitary pulmonary nodule 04/06/2015  . Headache 01/26/2015  . Intractable vascular headache 01/26/2015  . Blurry vision, bilateral 01/26/2015  . Eye pain 01/26/2015  . Paresthesias 01/26/2015  . Weakness 01/26/2015  . Endometriosis 11/20/2013  . Pelvic pain in female 09/18/2013  . DEPRESSION 09/07/2010  . BIPOLAR DISORDER UNSPECIFIED 07/13/2009  . ABDOMINAL WALL HERNIA 06/17/2009  . TREMOR 06/17/2009  . ACUTE CYSTITIS 03/08/2009  . SICKLE CELL TRAIT 03/04/2009  . NECK PAIN 03/04/2009  . ABDOMINAL PAIN, GENERALIZED 03/04/2009  . HYPERGLYCEMIA 12/11/2008   . SCOLIOSIS , IDIOPATHIC 11/24/2008  . CHRONIC PAIN SYNDROME 11/09/2008  . INSOMNIA 11/09/2008  . ARTHRALGIA 10/30/2008    Ameilia Rattan, PT 05/27/2015, 11:55 AM  [facility] [address] [place], [place], [address] Phone: [phone]   Fax:  [phone]  Name: Kristen [name] MRN: [id number] Date of Birth: 09/27/1980

## 2015-05-27 NOTE — Patient Instructions (Signed)
Healthy Back - Shoulder Roll    Stand straight with arms relaxed at sides. Roll shoulders backward continuously. Do __5-10__ times.This exercise can also be done one shoulder at a time.  Copyright  VHI. All rights reserved.  Lumbar Rotation: Caudal - Bilateral (Supine)    Feet and knees together GENTLY ROCK KNEES IN A SMALL DIRECTION EACH WAY.   Should not be painful, should feel relaxing so begin with small movement. Repeat __10__ times per set.  Do __2__ sessions per day.  http://orth.exer.us/1021   Copyright  VHI. All rights reserved.  Oculomotor: Saccades    Holding two targets positioned side by side __6__ inches apart, move eyes from target to target as head stays still. Move __3__ seconds each direction. Perform sitting. Repeat __5-10__ times per session. Do __2__ sessions per day.  Copyright  VHI. All rights reserved.

## 2015-05-31 ENCOUNTER — Ambulatory Visit: Payer: Medicare Other | Admitting: Rehabilitative and Restorative Service Providers"

## 2015-06-02 ENCOUNTER — Ambulatory Visit: Payer: Medicare Other | Admitting: Rehabilitative and Restorative Service Providers"

## 2015-06-02 DIAGNOSIS — M542 Cervicalgia: Secondary | ICD-10-CM | POA: Diagnosis not present

## 2015-06-02 DIAGNOSIS — R42 Dizziness and giddiness: Secondary | ICD-10-CM

## 2015-06-02 DIAGNOSIS — R293 Abnormal posture: Secondary | ICD-10-CM

## 2015-06-02 NOTE — Therapy (Signed)
[facility] [address] [place], [place], [address] Phone: [phone]   Fax:  [phone]  Physical Therapy Treatment  Patient Details  Name: Kristen [name] MRN: [id number] Date of Birth: 11-18-1980 Referring [place] Record: Dr. Hinton Rao  Encounter Date: 06/02/2015      PT End of Session - 06/02/15 0902    Visit Number 3   Number of Visits 16   Date for PT Re-Evaluation 07/16/15   Authorization Type G code every 10th visit   PT Start Time 0900  patient arrived 15 minutes late today   PT Stop Time 0930   PT Time Calculation (min) 30 min   Activity Tolerance Patient limited by pain   Behavior During Therapy Anxious      Past Medical History  Diagnosis Date  . Sickle cell trait ([place])   . Borderline diabetes     no meds  . Scoliosis   . Hernia, umbilical   . Depression   . Anxiety   . Panic attack   . OCD (obsessive compulsive disorder)   . Fibromyalgia   . Carpal tunnel syndrome on both sides   . Fibroid, uterine   . GERD (gastroesophageal reflux disease)   . Bipolar disorder ([place])   . Hx of bronchitis   . Headache(784.0)     otc meds prn  . Arthritis     lower back, hands, feet  . Anemia     history  . Umbilical hernia     Past Surgical History  Procedure Laterality Date  . Wisdom tooth extraction    . Back surgery      2 rods in back -scoliosis  . Laparoscopy N/A 10/29/2013    Procedure: LAPAROSCOPY DIAGNOSTIC;  Surgeon: Donnamae Jude, MD;  Location: [facility];  Service: Gynecology;  Laterality: N/A;  . Abdominal hysterectomy N/A 02/10/2014    Procedure: HYSTERECTOMY ABDOMINAL;  Surgeon: Donnamae Jude, MD;  Location: [facility];  Service: Gynecology;  Laterality: N/A;  . Salpingoophorectomy Bilateral 02/10/2014    Procedure: SALPINGO OOPHORECTOMY;  Surgeon: Donnamae Jude, MD;  Location: [facility];  Service: Gynecology;  Laterality: Bilateral;  . Umbilical hernia repair N/A 02/10/2014    Procedure:  HERNIA REPAIR UMBILICAL ADULT;  Surgeon: Donnamae Jude, MD;  Location: [facility];  Service: Gynecology;  Laterality: N/A;    There were no vitals filed for this visit.  Visit Diagnosis:  Neck pain  Postural instability  Dizziness and giddiness      Subjective Assessment - 06/02/15 0900    Subjective The patient reports she is still spinning when getting up or moving quickly.  PT and patient discussed treatment and patient thinks she is willing to try.   Pertinent History She reports x-rays done in ED for R hand, CT cervical spine.  h/o fibromyalgia, back surgery, bipolar disorder.   Patient Stated Goals "Just help me" be able to heal.   Currently in Pain? Yes   Pain Score 10-Worst pain ever   Pain Location Neck   Pain Orientation Right   Pain Descriptors / Indicators Aching;Shooting;Stabbing;Sharp   Pain Type Acute pain   Pain Onset More than a month ago   Pain Frequency Constant   Aggravating Factors  trying to look at items   Pain Relieving Factors unsure      THERAPEUTIC EXERCISE: Supine neck A/ROM with towel roll supporting curvature of neck performing minimal flex/extension *with jaw pain noted and then rotation within tolerable range of motion.  NEUROMUSCULAR RE-EDUCATION: Attempted to habituate movements at today's session.  This is extremely limited by patient calling out in pain and writhing in pain at times.  She reports a combination of neck pain, nausea, dizziness, back pain, leg pain and eye pain.  PT encouraged patient to participate within her tolerable range of motion and tolerance to movement. She performed supine>R rolling x 2 reps, neck rotation for habituation x 5 reps to each side in supine position   Patient attempted 1 rep of brandt daroff (see below) for assessment.  SELF CARE/HOME MANAGEMENT: PT and patient discussed routine and trying to stay awake for longer periods of time, however she reports going to bed is the only thing that helps.  During PT,  the patient is unable to keep her eyes open, however she drove herself here and reports she can keep her eyes open during driving tasks.   PT discussed need to move more frequently t/o the day.       Vestibular Assessment - 06/02/15 0904    Vestibular Assessment   General Observation Patient ambulated into clinic without SPC.   Positional Testing   Sidelying Test Sidelying Right;Sidelying Left   Sidelying Right   Sidelying Right Duration >60 seconds  patient with decreased tolerance- closes eyes/ reports spin   Sidelying Right Symptoms --  nystagmus not viewed due to patient closes eyes   Sidelying Left   Sidelying Left Duration >60 seconds  patient with decreased toleranc- reports spinning   Sidelying Left Symptoms --  nystagmus not viewed due to patient closing eyes            PT Short Term Goals - 05/19/15 [id number]    PT SHORT TERM GOAL #1   Title The patient will return demo HEP for neck A/ROM, gentle stretching for posture, and dizziness as tolerated.   Baseline Target date 06/16/2015   Time 4   Period Weeks   PT SHORT TERM GOAL #2   Title The patient will improve neck A/ROM supine to 20 degrees bilateral cervical rotation.   Baseline Target date 06/16/2015   Time 4   Period Weeks   PT SHORT TERM GOAL #3   Title The patient will tolerate neck A/ROM for further assessment for flexion/extension and sidebending.   Baseline Target date 06/16/2015   Time 4   Period Weeks   PT SHORT TERM GOAL #4   Title The patient will improve gait speed from 1.28 ft/sec to > or equal to 1.8 ft/sec to demo improving functional mobility.   Baseline Target date 06/16/2015   Time 4   Period Weeks   PT SHORT TERM GOAL #5   Title The patient will report decreased reliance on cervical collar to improve postural stability (wearing <50% of time out of bed).   Baseline Target date 06/16/2015   Time 4   Period Weeks   Additional Short Term Goals   Additional Short Term Goals Yes   PT SHORT  TERM GOAL #6   Title Further assess vertigo as patient neck ROM and behaviorally will tolerate.   Baseline Target date 06/16/2015   Time 4   Period Weeks           PT Long Term Goals - 05/18/15 0839    PT LONG TERM GOAL #1   Title The patient will be independent with progression of HEP.   Baseline Target date 07/16/2015   Time 8   Period Weeks   PT LONG TERM GOAL #2  Title The patient will improve gait speed from 1.28 ft/sec to > or equal to 2.2 ft/sec to demo improving mobility.   Baseline Target date 07/16/2015   Time 8   Period Weeks   PT LONG TERM GOAL #3   Title The patient will improve functional status score from 12% to > or equal to 30% to demo decreased self perception of disability.   Baseline Target date 07/16/2015   Time 8   Period Weeks   PT LONG TERM GOAL #4   Title The patient will report dizziness 4/10 at baseline (8/10 on eval)..   Baseline Target date 07/16/2015   Time 8   Period Weeks   PT LONG TERM GOAL #5   Title Improve A/ROM cervical spine to > or equal to 35 degrees bilaterally.   Baseline Target date 07/16/2015   Time 8   Period Weeks   Additional Long Term Goals   Additional Long Term Goals Yes   PT LONG TERM GOAL #6   Title Report decreased pain from HA from 10/10 down to < or equal to 6/10.   Baseline Target date 07/16/2015   Time 8   Period Weeks               Plan - 06/02/15 1436    Clinical Impression Statement The patient is limited in tolerance to all movement.  Full vestibular assessment not tolerated.  Patient is able to drive to therapy, but has difficulty maintaining her eyes open during sessions reporting severe pain and intolerance of light.  PT treating in darkened room with door cracked for lighting.   PT Next Visit Plan Check A/ROM neck exercise, add further stretching to HEP per patient tolerance  MEASURE neck A/ROM as tolerated and ASSESS vestibular system (oculomotor, gaze and positional testing) to tolerance.    Consulted and Agree with Plan of Care Patient        Problem List Patient Active Problem List   Diagnosis Date Noted  . Cigarette smoker 04/07/2015  . Upper airway cough syndrome 04/07/2015  . Dyspnea 04/07/2015  . Solitary pulmonary nodule 04/06/2015  . Headache 01/26/2015  . Intractable vascular headache 01/26/2015  . Blurry vision, bilateral 01/26/2015  . Eye pain 01/26/2015  . Paresthesias 01/26/2015  . Weakness 01/26/2015  . Endometriosis 11/20/2013  . Pelvic pain in female 09/18/2013  . DEPRESSION 09/07/2010  . BIPOLAR DISORDER UNSPECIFIED 07/13/2009  . ABDOMINAL WALL HERNIA 06/17/2009  . TREMOR 06/17/2009  . ACUTE CYSTITIS 03/08/2009  . SICKLE CELL TRAIT 03/04/2009  . NECK PAIN 03/04/2009  . ABDOMINAL PAIN, GENERALIZED 03/04/2009  . HYPERGLYCEMIA 12/11/2008  . SCOLIOSIS , IDIOPATHIC 11/24/2008  . CHRONIC PAIN SYNDROME 11/09/2008  . INSOMNIA 11/09/2008  . ARTHRALGIA 10/30/2008    WEAVER,CHRISTINA, PT 06/02/2015, 2:37 PM  [facility] [address] [place], [place], [address] Phone: [phone]   Fax:  [phone]  Name: Kristen [name] MRN: [id number] Date of Birth: 11/08/1980

## 2015-06-07 ENCOUNTER — Ambulatory Visit: Payer: Medicare Other | Admitting: Rehabilitative and Restorative Service Providers"

## 2015-06-09 ENCOUNTER — Ambulatory Visit: Payer: Medicare Other | Admitting: Rehabilitative and Restorative Service Providers"

## 2015-06-16 ENCOUNTER — Ambulatory Visit: Payer: Medicare Other | Admitting: Rehabilitative and Restorative Service Providers"

## 2015-06-16 DIAGNOSIS — M542 Cervicalgia: Secondary | ICD-10-CM | POA: Diagnosis not present

## 2015-06-16 DIAGNOSIS — R269 Unspecified abnormalities of gait and mobility: Secondary | ICD-10-CM

## 2015-06-16 DIAGNOSIS — R42 Dizziness and giddiness: Secondary | ICD-10-CM

## 2015-06-16 NOTE — Patient Instructions (Signed)
Tip Card 1.The goal of habituation training is to assist in decreasing symptoms of vertigo, dizziness, or nausea provoked by specific head and body motions. 2.These exercises may initially increase symptoms; however, be persistent and work through symptoms. With repetition and time, the exercises will assist in reducing or eliminating symptoms. 3.Exercises should be stopped and discussed with the therapist if you experience any of the following: - Sudden change or fluctuation in hearing - New onset of ringing in the ears, or increase in current intensity - Any fluid discharge from the ear - Severe pain in neck or back - Extreme nausea  Copyright  VHI. All rights reserved.  Rolling   With pillow under head, start on back. Roll to your right side.  Hold until dizziness stops, plus 20 seconds and then roll to the left side.  Hold until dizziness stops, plus 20 seconds.  Repeat sequence 5 times per session. Do 2 sessions per day.  Copyright  VHI. All rights reserved.  Sit to Side-Lying   Sit on edge of bed. Lie down onto the right side and hold until dizziness stops, plus 20 seconds.  Return to sitting and wait until dizziness stops, plus 20 seconds.  Repeat to the left side. Repeat sequence 5 times per session. Do 2 sessions per day.  Copyright  VHI. All rights reserved.   

## 2015-06-16 NOTE — Therapy (Signed)
[facility] [address] [place], [place], [address] Phone: [phone]   Fax:  [phone]  Physical Therapy Treatment  Patient Details  Name: Kristen [name] MRN: [id number] Date of Birth: 12-Jan-1981 Referring Provider: Dr. Hinton Rao  Encounter Date: 06/16/2015      PT End of Session - 06/16/15 0856    Visit Number 4   Number of Visits 16   Date for PT Re-Evaluation 07/16/15   Authorization Type G code every 10th visit   PT Start Time [phone]  patient arrived late   PT Stop Time 0932   PT Time Calculation (min) 40 min   Activity Tolerance Patient limited by pain   Behavior During Therapy Anxious      Past Medical History  Diagnosis Date  . Sickle cell trait ([place])   . Borderline diabetes     no meds  . Scoliosis   . Hernia, umbilical   . Depression   . Anxiety   . Panic attack   . OCD (obsessive compulsive disorder)   . Fibromyalgia   . Carpal tunnel syndrome on both sides   . Fibroid, uterine   . GERD (gastroesophageal reflux disease)   . Bipolar disorder ([place])   . Hx of bronchitis   . Headache(784.0)     otc meds prn  . Arthritis     lower back, hands, feet  . Anemia     history  . Umbilical hernia     Past Surgical History  Procedure Laterality Date  . Wisdom tooth extraction    . Back surgery      2 rods in back -scoliosis  . Laparoscopy N/A 10/29/2013    Procedure: LAPAROSCOPY DIAGNOSTIC;  Surgeon: Donnamae Jude, MD;  Location: [facility];  Service: Gynecology;  Laterality: N/A;  . Abdominal hysterectomy N/A 02/10/2014    Procedure: HYSTERECTOMY ABDOMINAL;  Surgeon: Donnamae Jude, MD;  Location: [facility];  Service: Gynecology;  Laterality: N/A;  . Salpingoophorectomy Bilateral 02/10/2014    Procedure: SALPINGO OOPHORECTOMY;  Surgeon: Donnamae Jude, MD;  Location: [facility];  Service: Gynecology;  Laterality: Bilateral;  . Umbilical hernia repair N/A 02/10/2014    Procedure: HERNIA REPAIR  UMBILICAL ADULT;  Surgeon: Donnamae Jude, MD;  Location: [facility];  Service: Gynecology;  Laterality: N/A;    There were no vitals filed for this visit.  Visit Diagnosis:  Neck pain  Dizziness and giddiness  Abnormality of gait      Subjective Assessment - 06/16/15 0854    Subjective The patient reports dizziness is worse with positional movements of head.  She reports sensitivity to light.  She reports continued room spinning, nausea, and exhaustion.   Patient Stated Goals "Just help me" be able to heal.   Currently in Pain? Yes   Pain Score 9    Pain Location Neck  head and eyes also hurt   Pain Descriptors / Indicators Aching   Pain Type Acute pain   Pain Onset More than a month ago   Pain Frequency Constant   Aggravating Factors  noise and light aggravate her   Pain Relieving Factors unsure      NEUROMUSCULAR RE-EDUCATION:      Vestibular Assessment - 06/16/15 0859    Positional Testing   Dix-Hallpike Dix-Hallpike Right;Dix-Hallpike Left   Sidelying Test Sidelying Right;Sidelying Left   Horizontal Canal Testing Horizontal Canal Right;Horizontal Canal Left   Sidelying Right   Sidelying Right Duration >60 seconds  Sidelying Right Symptoms --  nystagmus not viewed, patient closes her eyes   Sidelying Left   Sidelying Left Duration >60  seconds   Sidelying Left Symptoms --  nystagmus not viewed, patient closes eyes   Horizontal Canal Right   Horizontal Canal Right Duration >60 seconds   Horizontal Canal Right Symptoms --  nystagmus not viewed, patient closes eyes   Horizontal Canal Left   Horizontal Canal Left Duration >60 seconds   Horizontal Canal Left Symptoms --  nystagmus not viewed, patient closes eyes           Vestibular Treatment/Exercise - 06/16/15 2536    Vestibular Treatment/Exercise   Vestibular Treatment Provided Habituation   Habituation Exercises Laruth Bouchard Daroff;Horizontal Roll   Nestor Lewandowsky   Number of Reps  3   Symptom Description   patient experiences severe dizziness and lightheadedness with movements.  She has decreased tolerance to all movements and has difficulty opening her eyes.  She c/o nausea.  Rested in between each movement.   Horizontal Roll   Number of Reps  1 Patient experiences severe dizziness and only tolerates 1 rep.  She rests x minutes before she can open her eyes.      THERAPEUTIC EXERCISE: Seated neck A/ROM with emphasis on patient keeping her eyes open during movements Seated neck A/ROM of 10 deg flexion/extension and sidebending. Patient limited in all motion with muscle guarding.         PT Education - 06/16/15 0930    Education provided Yes   Education Details HEP: habituation rolling and sit<>sidelying   Person(s) Educated Patient   Methods Explanation;Demonstration;Handout   Comprehension Returned demonstration;Verbalized understanding;Verbal cues required;Need further instruction          PT Short Term Goals - 06/16/15 0925    PT SHORT TERM GOAL #1   Title The patient will return demo HEP for neck A/ROM, gentle stretching for posture, and dizziness as tolerated.   Baseline Target date 06/16/2015   Time 4   Period Weeks   Status Achieved   PT SHORT TERM GOAL #2   Title The patient will improve neck A/ROM supine to 20 degrees bilateral cervical rotation.   Baseline Sitting, the patient can perform 30 degrees of motion to each direction.   Time 4   Period Weeks   Status Achieved   PT SHORT TERM GOAL #3   Title The patient will tolerate neck A/ROM for further assessment for flexion/extension and sidebending.   Baseline Met on 06/16/2015 with cervical flexion/extension approximately 10 degrees, sidebending 10 degrees to each side.   Time 4   Period Weeks   Status Achieved   PT SHORT TERM GOAL #4   Title The patient will improve gait speed from 1.28 ft/sec to > or equal to 1.8 ft/sec to demo improving functional mobility.   Baseline Target date 06/16/2015   Time 4   Period  Weeks   Status On-going   PT SHORT TERM GOAL #5   Title The patient will report decreased reliance on cervical collar to improve postural stability (wearing <50% of time out of bed).   Baseline Patient using neck brace for <50% of wake time.   Time 4   Period Weeks   Status Achieved   PT SHORT TERM GOAL #6   Title Further assess vertigo as patient neck ROM and behaviorally will tolerate.   Baseline Target date 06/16/2015   Time 4   Period Weeks   Status Achieved  PT Long Term Goals - 05/18/15 1610    PT LONG TERM GOAL #1   Title The patient will be independent with progression of HEP.   Baseline Target date 07/16/2015   Time 8   Period Weeks   PT LONG TERM GOAL #2   Title The patient will improve gait speed from 1.28 ft/sec to > or equal to 2.2 ft/sec to demo improving mobility.   Baseline Target date 07/16/2015   Time 8   Period Weeks   PT LONG TERM GOAL #3   Title The patient will improve functional status score from 12% to > or equal to 30% to demo decreased self perception of disability.   Baseline Target date 07/16/2015   Time 8   Period Weeks   PT LONG TERM GOAL #4   Title The patient will report dizziness 4/10 at baseline (8/10 on eval)..   Baseline Target date 07/16/2015   Time 8   Period Weeks   PT LONG TERM GOAL #5   Title Improve A/ROM cervical spine to > or equal to 35 degrees bilaterally.   Baseline Target date 07/16/2015   Time 8   Period Weeks   Additional Long Term Goals   Additional Long Term Goals Yes   PT LONG TERM GOAL #6   Title Report decreased pain from HA from 10/10 down to < or equal to 6/10.   Baseline Target date 07/16/2015   Time 8   Period Weeks               Plan - 06/16/15 1341    Clinical Impression Statement The patient is slowly improving tolerance to mobility in therapy.  She needs extensive time to rest in between movements.  PT added habituation recommending patient perform as able and educating her that symptoms  will not improve if she sits still to avoid motion.  The patient has multiple factors hindering more progression of activities in therapy including: dizziness, pain, anxiety re: motion.   PT Next Visit Plan Progress habituation, add gaze, check saccades HEP.   Consulted and Agree with Plan of Care Patient        Problem List Patient Active Problem List   Diagnosis Date Noted  . Cigarette smoker 04/07/2015  . Upper airway cough syndrome 04/07/2015  . Dyspnea 04/07/2015  . Solitary pulmonary nodule 04/06/2015  . Headache 01/26/2015  . Intractable vascular headache 01/26/2015  . Blurry vision, bilateral 01/26/2015  . Eye pain 01/26/2015  . Paresthesias 01/26/2015  . Weakness 01/26/2015  . Endometriosis 11/20/2013  . Pelvic pain in female 09/18/2013  . DEPRESSION 09/07/2010  . BIPOLAR DISORDER UNSPECIFIED 07/13/2009  . ABDOMINAL WALL HERNIA 06/17/2009  . TREMOR 06/17/2009  . ACUTE CYSTITIS 03/08/2009  . SICKLE CELL TRAIT 03/04/2009  . NECK PAIN 03/04/2009  . ABDOMINAL PAIN, GENERALIZED 03/04/2009  . HYPERGLYCEMIA 12/11/2008  . SCOLIOSIS , IDIOPATHIC 11/24/2008  . CHRONIC PAIN SYNDROME 11/09/2008  . INSOMNIA 11/09/2008  . ARTHRALGIA 10/30/2008    Lovella Hardie , PT  06/16/2015, 1:43 PM  [facility] [address] [place], [place], [address] Phone: [phone]   Fax:  [phone]  Name: Kristen [name] MRN: [id number] Date of Birth: 03/08/1981

## 2015-06-17 ENCOUNTER — Ambulatory Visit: Payer: Medicare Other | Admitting: Rehabilitative and Restorative Service Providers"

## 2015-06-17 DIAGNOSIS — R269 Unspecified abnormalities of gait and mobility: Secondary | ICD-10-CM

## 2015-06-17 DIAGNOSIS — M542 Cervicalgia: Secondary | ICD-10-CM | POA: Diagnosis not present

## 2015-06-17 DIAGNOSIS — R42 Dizziness and giddiness: Secondary | ICD-10-CM

## 2015-06-17 NOTE — Therapy (Signed)
[facility] [address] [place], [place], [address] Phone: [phone]   Fax:  [phone]  Physical Therapy Treatment  Patient Details  Name: Kristen [name] MRN: [id number] Date of Birth: [birth year] Referring Provider: Dr. Hinton Rao  Encounter Date: 06/17/2015      PT End of Session - 06/17/15 0856    Visit Number 5   Number of Visits 16   Date for PT Re-Evaluation 07/16/15   Authorization Type G code every 10th visit   PT Start Time 0852   PT Stop Time 0932   PT Time Calculation (min) 40 min   Activity Tolerance Patient limited by pain   Behavior During Therapy Anxious      Past Medical History  Diagnosis Date  . Sickle cell trait ([place])   . Borderline diabetes     no meds  . Scoliosis   . Hernia, umbilical   . Depression   . Anxiety   . Panic attack   . OCD (obsessive compulsive disorder)   . Fibromyalgia   . Carpal tunnel syndrome on both sides   . Fibroid, uterine   . GERD (gastroesophageal reflux disease)   . Bipolar disorder ([place])   . Hx of bronchitis   . Headache(784.0)     otc meds prn  . Arthritis     lower back, hands, feet  . Anemia     history  . Umbilical hernia     Past Surgical History  Procedure Laterality Date  . Wisdom tooth extraction    . Back surgery      2 rods in back -scoliosis  . Laparoscopy N/A 10/29/2013    Procedure: LAPAROSCOPY DIAGNOSTIC;  Surgeon: Donnamae Jude, MD;  Location: [facility];  Service: Gynecology;  Laterality: N/A;  . Abdominal hysterectomy N/A 02/10/2014    Procedure: HYSTERECTOMY ABDOMINAL;  Surgeon: Donnamae Jude, MD;  Location: [facility];  Service: Gynecology;  Laterality: N/A;  . Salpingoophorectomy Bilateral 02/10/2014    Procedure: SALPINGO OOPHORECTOMY;  Surgeon: Donnamae Jude, MD;  Location: [facility];  Service: Gynecology;  Laterality: Bilateral;  . Umbilical hernia repair N/A 02/10/2014    Procedure: HERNIA REPAIR UMBILICAL ADULT;  Surgeon:  Donnamae Jude, MD;  Location: [facility];  Service: Gynecology;  Laterality: N/A;    There were no vitals filed for this visit.  Visit Diagnosis:  Neck pain  Dizziness and giddiness  Abnormality of gait      Subjective Assessment - 06/17/15 0854    Subjective The patient reports she took her meds today before PT.   Patient Stated Goals "Just help me" be able to heal.   Currently in Pain? Yes   Pain Score 9    Pain Location Neck   Pain Descriptors / Indicators Aching   Pain Type Acute pain   Pain Onset More than a month ago   Pain Frequency Constant   Aggravating Factors  everything   Pain Relieving Factors unsure      NEUROMUSCULAR RE-EDUCATION: Reviewed habituation exercises including brandt daroff x 2 reps. Rolling x 2 reps to each side. Patient requires rest breaks t/o movements and covers her eyes due to light sensitivity. Seated head turns for habituation Cues to settle dizziness including fixation on targets.    MANUAL: Supine passive overpressure into rotation using a towel roll under cervical spine to encourage minimal neck extension to mimic normal curvature of spine during movements.  Patient gets >45 degrees of ROM with passive  movement with no tissue resistance. Supine gentle manual distraction and passive motion into cervical flexion with cues on breathing. Gentle trunk rocking with passive overpressure to stretch through ribs and trunk.  THERAPEUTIC EXERCISE: Standing trunk rotation near countertop for support Supine hip ab/adduction in hooklying position Seated trunk elongation using a ball for reaching and encouraging head/neck movements      PT Short Term Goals - 06/16/15 8916    PT SHORT TERM GOAL #1   Title The patient will return demo HEP for neck A/ROM, gentle stretching for posture, and dizziness as tolerated.   Baseline Target date 06/16/2015   Time 4   Period Weeks   Status Achieved   PT SHORT TERM GOAL #2   Title The patient will improve neck  A/ROM supine to 20 degrees bilateral cervical rotation.   Baseline Sitting, the patient can perform 30 degrees of motion to each direction.   Time 4   Period Weeks   Status Achieved   PT SHORT TERM GOAL #3   Title The patient will tolerate neck A/ROM for further assessment for flexion/extension and sidebending.   Baseline Met on 06/16/2015 with cervical flexion/extension approximately 10 degrees, sidebending 10 degrees to each side.   Time 4   Period Weeks   Status Achieved   PT SHORT TERM GOAL #4   Title The patient will improve gait speed from 1.28 ft/sec to > or equal to 1.8 ft/sec to demo improving functional mobility.   Baseline Target date 06/16/2015   Time 4   Period Weeks   Status On-going   PT SHORT TERM GOAL #5   Title The patient will report decreased reliance on cervical collar to improve postural stability (wearing <50% of time out of bed).   Baseline Patient using neck brace for <50% of wake time.   Time 4   Period Weeks   Status Achieved   PT SHORT TERM GOAL #6   Title Further assess vertigo as patient neck ROM and behaviorally will tolerate.   Baseline Target date 06/16/2015   Time 4   Period Weeks   Status Achieved           PT Long Term Goals - 05/18/15 0839    PT LONG TERM GOAL #1   Title The patient will be independent with progression of HEP.   Baseline Target date 07/16/2015   Time 8   Period Weeks   PT LONG TERM GOAL #2   Title The patient will improve gait speed from 1.28 ft/sec to > or equal to 2.2 ft/sec to demo improving mobility.   Baseline Target date 07/16/2015   Time 8   Period Weeks   PT LONG TERM GOAL #3   Title The patient will improve functional status score from 12% to > or equal to 30% to demo decreased self perception of disability.   Baseline Target date 07/16/2015   Time 8   Period Weeks   PT LONG TERM GOAL #4   Title The patient will report dizziness 4/10 at baseline (8/10 on eval)..   Baseline Target date 07/16/2015   Time 8    Period Weeks   PT LONG TERM GOAL #5   Title Improve A/ROM cervical spine to > or equal to 35 degrees bilaterally.   Baseline Target date 07/16/2015   Time 8   Period Weeks   Additional Long Term Goals   Additional Long Term Goals Yes   PT LONG TERM GOAL #6   Title Report  decreased pain from HA from 10/10 down to < or equal to 6/10.   Baseline Target date 07/16/2015   Time 8   Period Weeks               Plan - 06/17/15 1859    Clinical Impression Statement The patient tolerated increased movement today with PT encouraging patient constantly to participate and allowing rest when needed.  Continue working towards STGs/LTGs.   PT Next Visit Plan Progress habituation, add gaze, check saccades HEP.   Consulted and Agree with Plan of Care Patient        Problem List Patient Active Problem List   Diagnosis Date Noted  . Cigarette smoker 04/07/2015  . Upper airway cough syndrome 04/07/2015  . Dyspnea 04/07/2015  . Solitary pulmonary nodule 04/06/2015  . Headache 01/26/2015  . Intractable vascular headache 01/26/2015  . Blurry vision, bilateral 01/26/2015  . Eye pain 01/26/2015  . Paresthesias 01/26/2015  . Weakness 01/26/2015  . Endometriosis 11/20/2013  . Pelvic pain in female 09/18/2013  . DEPRESSION 09/07/2010  . BIPOLAR DISORDER UNSPECIFIED 07/13/2009  . ABDOMINAL WALL HERNIA 06/17/2009  . TREMOR 06/17/2009  . ACUTE CYSTITIS 03/08/2009  . SICKLE CELL TRAIT 03/04/2009  . NECK PAIN 03/04/2009  . ABDOMINAL PAIN, GENERALIZED 03/04/2009  . HYPERGLYCEMIA 12/11/2008  . SCOLIOSIS , IDIOPATHIC 11/24/2008  . CHRONIC PAIN SYNDROME 11/09/2008  . INSOMNIA 11/09/2008  . ARTHRALGIA 10/30/2008    Abhimanyu Cruces, PT 06/17/2015, 9:35 AM  [facility] [address] [place], [place], [address] Phone: [phone]   Fax:  [phone]  Name: Kristen [name] MRN: [id number] Date of Birth: 07-12-1980

## 2015-06-28 ENCOUNTER — Inpatient Hospital Stay: Admission: RE | Admit: 2015-06-28 | Payer: Medicare Other | Source: Ambulatory Visit

## 2015-06-28 ENCOUNTER — Telehealth: Payer: Self-pay | Admitting: Internal Medicine

## 2015-06-28 NOTE — Telephone Encounter (Signed)
LMTCB for pt 

## 2015-06-28 NOTE — Telephone Encounter (Signed)
Patient returned call, CB [phone].

## 2015-06-30 NOTE — Telephone Encounter (Signed)
lmtcb x2 for pt. 

## 2015-07-01 NOTE — Telephone Encounter (Signed)
lmtcb X3 for pt.  Will close message per triage protocol.  

## 2015-07-02 ENCOUNTER — Encounter: Payer: Self-pay | Admitting: Neurology

## 2015-07-02 ENCOUNTER — Ambulatory Visit (INDEPENDENT_AMBULATORY_CARE_PROVIDER_SITE_OTHER): Payer: Medicare Other | Admitting: Neurology

## 2015-07-02 VITALS — BP 120/74 | HR 72 | Ht 66.5 in | Wt 178.4 lb

## 2015-07-02 DIAGNOSIS — R519 Headache, unspecified: Secondary | ICD-10-CM

## 2015-07-02 DIAGNOSIS — G894 Chronic pain syndrome: Secondary | ICD-10-CM | POA: Diagnosis not present

## 2015-07-02 DIAGNOSIS — F17200 Nicotine dependence, unspecified, uncomplicated: Secondary | ICD-10-CM

## 2015-07-02 DIAGNOSIS — R51 Headache: Secondary | ICD-10-CM

## 2015-07-02 DIAGNOSIS — S060X1A Concussion with loss of consciousness of 30 minutes or less, initial encounter: Secondary | ICD-10-CM

## 2015-07-02 DIAGNOSIS — S060X9A Concussion with loss of consciousness of unspecified duration, initial encounter: Secondary | ICD-10-CM | POA: Insufficient documentation

## 2015-07-02 DIAGNOSIS — F319 Bipolar disorder, unspecified: Secondary | ICD-10-CM

## 2015-07-02 MED ORDER — RIBOFLAVIN 400 MG PO TABS: 400.0000 mg | ORAL_TABLET | Freq: Every day | ORAL | Status: DC

## 2015-07-02 MED ORDER — NORTRIPTYLINE HCL 10 MG PO CAPS: 10.0000 mg | ORAL_CAPSULE | Freq: Every day | ORAL | Status: DC

## 2015-07-02 MED ORDER — MAGNESIUM OXIDE 400 MG PO TABS: 400.0000 mg | ORAL_TABLET | Freq: Every day | ORAL | Status: DC

## 2015-07-02 MED ORDER — COENZYME Q10 100 MG PO TABS: ORAL_TABLET | ORAL | Status: DC
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
## 2015-07-02 NOTE — Patient Instructions (Signed)
1.  Start nortriptyline 10mg  at bedtime.  Call in 4 weeks with update.  I want you to stop the promethazine cough syrup 2.  I would like you to limit screen time (including your phone) to 90 minutes daily for next week.   I want you to be active.  Start with walking outside daily down the street and back and add a little distance daily.   3.  Also for headaches, take:  Coenzyme Q10 100mg  three times daily  Riboflavin 400mg  daily  Magnesium oxide 400mg  daily  5.  You must follow up with psychiatrist 6.  Follow up in 3 months

## 2015-07-02 NOTE — Progress Notes (Signed)
Chart forwarded.  

## 2015-08-02 ENCOUNTER — Ambulatory Visit
Payer: Medicare Other | Attending: Physical Medicine and Rehabilitation | Admitting: Rehabilitative and Restorative Service Providers"

## 2015-08-02 DIAGNOSIS — R269 Unspecified abnormalities of gait and mobility: Secondary | ICD-10-CM | POA: Insufficient documentation

## 2015-08-02 DIAGNOSIS — R42 Dizziness and giddiness: Secondary | ICD-10-CM | POA: Insufficient documentation

## 2015-08-02 DIAGNOSIS — M542 Cervicalgia: Secondary | ICD-10-CM | POA: Insufficient documentation

## 2015-08-03 ENCOUNTER — Ambulatory Visit: Payer: Medicare Other | Admitting: Rehabilitative and Restorative Service Providers"

## 2015-08-03 DIAGNOSIS — R42 Dizziness and giddiness: Secondary | ICD-10-CM

## 2015-08-03 DIAGNOSIS — M542 Cervicalgia: Secondary | ICD-10-CM

## 2015-08-03 DIAGNOSIS — R269 Unspecified abnormalities of gait and mobility: Secondary | ICD-10-CM | POA: Diagnosis present

## 2015-08-03 NOTE — Therapy (Signed)
[facility] [address] [place], [place], [address] Phone: [phone]   Fax:  [phone]  Physical Therapy Treatment  Patient Details  Name: Kristen [name] MRN: [id number] Date of Birth: [birth year] Referring Provider: Dr. Hinton Rao  Encounter Date: 08/03/2015      PT End of Session - 08/03/15 1427    Visit Number 6   Number of Visits 16   Date for PT Re-Evaluation 07/16/15   Authorization Type G code every 10th visit   PT Start Time 0848  Patient in PT for 46 minutes, but only performed x 30 minutes due to being emotionally labile, requiring frequent rest breaks to improve tolerance to activity.   PT Stop Time 0934   PT Time Calculation (min) 46 min   Activity Tolerance Patient limited by pain   Behavior During Therapy Anxious;Agitated      Past Medical History  Diagnosis Date  . Sickle cell trait ([place])   . Borderline diabetes     no meds  . Scoliosis   . Hernia, umbilical   . Depression   . Anxiety   . Panic attack   . OCD (obsessive compulsive disorder)   . Fibromyalgia   . Carpal tunnel syndrome on both sides   . Fibroid, uterine   . GERD (gastroesophageal reflux disease)   . Bipolar disorder ([place])   . Hx of bronchitis   . Headache(784.0)     otc meds prn  . Arthritis     lower back, hands, feet  . Anemia     history  . Umbilical hernia     Past Surgical History  Procedure Laterality Date  . Wisdom tooth extraction    . Back surgery      2 rods in back -scoliosis  . Laparoscopy N/A 10/29/2013    Procedure: LAPAROSCOPY DIAGNOSTIC;  Surgeon: Donnamae Jude, MD;  Location: [facility];  Service: Gynecology;  Laterality: N/A;  . Abdominal hysterectomy N/A 02/10/2014    Procedure: HYSTERECTOMY ABDOMINAL;  Surgeon: Donnamae Jude, MD;  Location: [facility];  Service: Gynecology;  Laterality: N/A;  . Salpingoophorectomy Bilateral 02/10/2014    Procedure: SALPINGO OOPHORECTOMY;  Surgeon: Donnamae Jude,  MD;  Location: [facility];  Service: Gynecology;  Laterality: Bilateral;  . Umbilical hernia repair N/A 02/10/2014    Procedure: HERNIA REPAIR UMBILICAL ADULT;  Surgeon: Donnamae Jude, MD;  Location: [facility];  Service: Gynecology;  Laterality: N/A;    There were no vitals filed for this visit.  Visit Diagnosis:  Neck pain  Dizziness and giddiness  Abnormality of gait      Subjective Assessment - 08/03/15 0851    Subjective The patient reports that neck is still painful.  She reports any movement provokes dizziness.   Patient Stated Goals "Just help me" be able to heal.   Currently in Pain? Yes   Pain Score 10-Worst pain ever   Pain Location Neck   Pain Orientation Right   Pain Descriptors / Indicators Aching   Pain Type Acute pain   Pain Onset More than a month ago   Pain Frequency Constant   Aggravating Factors  movement, stress, light   Pain Relieving Factors no movement, reduced stress, no light     THERAPEUTIC EXERCISE: Supine position: Neck P/ROM for rotation  Gentle manual distraction with increased muscle guarding  Towel roll with A/ROM into neck rotation with head nods to R and L for self mobilization (pt tolerates A/ROM better than  P/ROM due to anxiety today)  SELF CARE/HOME MANAGEMENT: PT and patient discussed anxiety and pain limiting her performance and tolerance to activities in therapy.  Patient discussed that she forgot and missed her psychiatry appointment.  PT recommended she call Dr. Everardo Beals (MD initially referring her to psychiatry) to determine whom she was referred to as she no longer can find that information.   PT discussed therapy may not be able to be an effective intervention until other issues are addressed.  Patient wishes to continue PT because she reports feeling worse since not coming over the past 6 weeks. PT recommended patient resume home exercises.       PT Short Term Goals - 06/16/15 1194    PT SHORT TERM GOAL #1   Title The patient will  return demo HEP for neck A/ROM, gentle stretching for posture, and dizziness as tolerated.   Baseline Target date 06/16/2015   Time 4   Period Weeks   Status Achieved   PT SHORT TERM GOAL #2   Title The patient will improve neck A/ROM supine to 20 degrees bilateral cervical rotation.   Baseline Sitting, the patient can perform 30 degrees of motion to each direction.   Time 4   Period Weeks   Status Achieved   PT SHORT TERM GOAL #3   Title The patient will tolerate neck A/ROM for further assessment for flexion/extension and sidebending.   Baseline Met on 06/16/2015 with cervical flexion/extension approximately 10 degrees, sidebending 10 degrees to each side.   Time 4   Period Weeks   Status Achieved   PT SHORT TERM GOAL #4   Title The patient will improve gait speed from 1.28 ft/sec to > or equal to 1.8 ft/sec to demo improving functional mobility.   Baseline Target date 06/16/2015   Time 4   Period Weeks   Status On-going   PT SHORT TERM GOAL #5   Title The patient will report decreased reliance on cervical collar to improve postural stability (wearing <50% of time out of bed).   Baseline Patient using neck brace for <50% of wake time.   Time 4   Period Weeks   Status Achieved   PT SHORT TERM GOAL #6   Title Further assess vertigo as patient neck ROM and behaviorally will tolerate.   Baseline Target date 06/16/2015   Time 4   Period Weeks   Status Achieved           PT Long Term Goals - 08/03/15 1427    PT LONG TERM GOAL #1   Title The patient will be independent with progression of HEP.   Baseline Modified target date 08/31/2015   Time 8   Period Weeks   Status On-going   PT LONG TERM GOAL #2   Title The patient will improve gait speed from 1.28 ft/sec to > or equal to 2.2 ft/sec to demo improving mobility.   Baseline Modified target date 08/31/2015   Time 8   Period Weeks   Status On-going   PT LONG TERM GOAL #3   Title The patient will improve functional status  score from 12% to > or equal to 30% to demo decreased self perception of disability.   Baseline Modified target date 08/31/2015   Time 8   Period Weeks   Status On-going   PT LONG TERM GOAL #4   Title The patient will report dizziness 4/10 at baseline (8/10 on eval)..   Baseline Modified target date 08/31/2015  Time 8   Period Weeks   Status On-going   PT LONG TERM GOAL #5   Title Improve A/ROM cervical spine to > or equal to 35 degrees bilaterally.   Baseline Patient has >45 degrees A/ROMm rotation bilaterally.   Time 8   Period Weeks   Status Achieved   PT LONG TERM GOAL #6   Title Report decreased pain from HA from 10/10 down to < or equal to 6/10.   Baseline Patient has MD management of headaches.   Time 8   Period Weeks   Status Deferred               Plan - 08/03/15 1429    Clinical Impression Statement The patient has not been seen in PT since 06/17/2015.  She returns today reporting difficulty with scheduling and memory issues regarding keeping up with her schedule.  She is limited in progress due to continued intolerance to light, 10/10 neck pain, headaches, and anxiety level.  PT recommended she f/u with psychiatry appointment as soon as possible as her response to PT is limited due to anxiety and pain.  Patient to continue with PT x 4 more weeks to continue to progress home program to tolerance.  If we are unable to progress further, we plan to d/c.   Pt will benefit from skilled therapeutic intervention in order to improve on the following deficits Abnormal gait;Decreased activity tolerance;Decreased balance;Decreased mobility;Postural dysfunction;Impaired flexibility;Hypomobility;Difficulty walking;Decreased range of motion;Pain;Dizziness;Impaired perceived functional ability   Rehab Potential Fair   Clinical Impairments Affecting Rehab Potential patient limited by anxiety and pain   PT Frequency 1x / week   PT Duration 4 weeks   PT Treatment/Interventions  Therapeutic exercise;Therapeutic activities;Functional mobility training;Gait training;ADLs/Self Care Home Management;Balance training;Neuromuscular re-education;Canalith Repostioning;Vestibular;Manual techniques;Patient/family education;Passive range of motion   PT Next Visit Plan begin VOR x 1 viewing, check other HEP   Consulted and Agree with Plan of Care Patient        Problem List Patient Active Problem List   Diagnosis Date Noted  . Concussion with loss of consciousness 07/02/2015  . Cigarette smoker 04/07/2015  . Upper airway cough syndrome 04/07/2015  . Dyspnea 04/07/2015  . Solitary pulmonary nodule 04/06/2015  . Headache 01/26/2015  . Intractable vascular headache 01/26/2015  . Blurry vision, bilateral 01/26/2015  . Eye pain 01/26/2015  . Paresthesias 01/26/2015  . Weakness 01/26/2015  . Endometriosis 11/20/2013  . Pelvic pain in female 09/18/2013  . DEPRESSION 09/07/2010  . BIPOLAR DISORDER UNSPECIFIED 07/13/2009  . ABDOMINAL WALL HERNIA 06/17/2009  . TREMOR 06/17/2009  . ACUTE CYSTITIS 03/08/2009  . SICKLE CELL TRAIT 03/04/2009  . NECK PAIN 03/04/2009  . ABDOMINAL PAIN, GENERALIZED 03/04/2009  . HYPERGLYCEMIA 12/11/2008  . SCOLIOSIS , IDIOPATHIC 11/24/2008  . CHRONIC PAIN SYNDROME 11/09/2008  . INSOMNIA 11/09/2008  . ARTHRALGIA 10/30/2008    Smt Lokey, PT 08/03/2015, 2:57 PM  [facility] [address] [place], [place], [address] Phone: [phone]   Fax:  [phone]  Name: Kristen [name] MRN: [id number] Date of Birth: [birth year]

## 2015-08-09 ENCOUNTER — Ambulatory Visit: Payer: Medicare Other | Admitting: Rehabilitative and Restorative Service Providers"

## 2015-08-09 DIAGNOSIS — R42 Dizziness and giddiness: Secondary | ICD-10-CM

## 2015-08-09 DIAGNOSIS — R269 Unspecified abnormalities of gait and mobility: Secondary | ICD-10-CM

## 2015-08-09 NOTE — Therapy (Signed)
[facility] [address] [place], [place], [address] Phone: [phone]   Fax:  [phone]  Physical Therapy Treatment  Patient Details  Name: Kristen [name] MRN: [id number] Date of Birth: [birth year] Referring Provider: Dr. Hinton Rao  Encounter Date: 08/09/2015      PT End of Session - 08/09/15 0829    Visit Number 7   Number of Visits 16   Date for PT Re-Evaluation 07/16/15   Authorization Type G code every 10th visit   PT Start Time 0808   PT Stop Time 0840   PT Time Calculation (min) 32 min   Activity Tolerance Patient limited by pain   Behavior During Therapy Anxious;Agitated      Past Medical History  Diagnosis Date  . Sickle cell trait ([place])   . Borderline diabetes     no meds  . Scoliosis   . Hernia, umbilical   . Depression   . Anxiety   . Panic attack   . OCD (obsessive compulsive disorder)   . Fibromyalgia   . Carpal tunnel syndrome on both sides   . Fibroid, uterine   . GERD (gastroesophageal reflux disease)   . Bipolar disorder ([place])   . Hx of bronchitis   . Headache(784.0)     otc meds prn  . Arthritis     lower back, hands, feet  . Anemia     history  . Umbilical hernia     Past Surgical History  Procedure Laterality Date  . Wisdom tooth extraction    . Back surgery      2 rods in back -scoliosis  . Laparoscopy N/A 10/29/2013    Procedure: LAPAROSCOPY DIAGNOSTIC;  Surgeon: Donnamae Jude, MD;  Location: [facility];  Service: Gynecology;  Laterality: N/A;  . Abdominal hysterectomy N/A 02/10/2014    Procedure: HYSTERECTOMY ABDOMINAL;  Surgeon: Donnamae Jude, MD;  Location: [facility];  Service: Gynecology;  Laterality: N/A;  . Salpingoophorectomy Bilateral 02/10/2014    Procedure: SALPINGO OOPHORECTOMY;  Surgeon: Donnamae Jude, MD;  Location: [facility];  Service: Gynecology;  Laterality: Bilateral;  . Umbilical hernia repair N/A 02/10/2014    Procedure: HERNIA REPAIR UMBILICAL ADULT;   Surgeon: Donnamae Jude, MD;  Location: [facility];  Service: Gynecology;  Laterality: N/A;    There were no vitals filed for this visit.  Visit Diagnosis:  Dizziness and giddiness  Abnormality of gait      Subjective Assessment - 08/09/15 0815    Subjective The patient reports that she has new onset of tooth pain R side.  She reports dizziness continues and is constant in nature.   She reports she is trying to do exercises at home.   Patient Stated Goals "Just help me" be able to heal.   Currently in Pain? Yes   Pain Score 10-Worst pain ever   Pain Location Face   Pain Orientation Right   Pain Descriptors / Indicators Aching   Pain Type Acute pain   Pain Radiating Towards *new onset of R tooth pain.    Pain Onset In the past 7 days   Pain Frequency Constant           Vestibular Treatment/Exercise - 08/09/15 0819    Vestibular Treatment/Exercise   Vestibular Treatment Provided Gaze   Habituation Exercises Laruth Bouchard Daroff;Seated Horizontal Head Turns;Seated Vertical Head Turns;Comment  Walking used for habituation activity to improve tolerance   Gaze Exercises X1 Viewing Horizontal;X1 Viewing Vertical   Nestor Lewandowsky  Number of Reps  2   Symptom Description  patient experiences continued dizziness and rests in between each movement.  She is unale to keep her eyes open while performing this activity.   X1 Viewing Horizontal   Foot Position seated, then standing   Reps 2  reps x 8 reps   Comments Patient has to rest reporting eye strain, head pain and difficulty performing.   X1 Viewing Vertical   Foot Position seated   Reps 2  sets x 5 reps               PT Education - 08/09/15 0838    Education provided Yes   Education Details HEP: gaze x 1 viewing standing, walking, and continuing prior HEP   Person(s) Educated Patient   Methods Explanation;Handout   Comprehension Verbalized understanding;Returned demonstration;Verbal cues required          PT Short Term  Goals - 06/16/15 0925    PT SHORT TERM GOAL #1   Title The patient will return demo HEP for neck A/ROM, gentle stretching for posture, and dizziness as tolerated.   Baseline Target date 06/16/2015   Time 4   Period Weeks   Status Achieved   PT SHORT TERM GOAL #2   Title The patient will improve neck A/ROM supine to 20 degrees bilateral cervical rotation.   Baseline Sitting, the patient can perform 30 degrees of motion to each direction.   Time 4   Period Weeks   Status Achieved   PT SHORT TERM GOAL #3   Title The patient will tolerate neck A/ROM for further assessment for flexion/extension and sidebending.   Baseline Met on 06/16/2015 with cervical flexion/extension approximately 10 degrees, sidebending 10 degrees to each side.   Time 4   Period Weeks   Status Achieved   PT SHORT TERM GOAL #4   Title The patient will improve gait speed from 1.28 ft/sec to > or equal to 1.8 ft/sec to demo improving functional mobility.   Baseline Target date 06/16/2015   Time 4   Period Weeks   Status On-going   PT SHORT TERM GOAL #5   Title The patient will report decreased reliance on cervical collar to improve postural stability (wearing <50% of time out of bed).   Baseline Patient using neck brace for <50% of wake time.   Time 4   Period Weeks   Status Achieved   PT SHORT TERM GOAL #6   Title Further assess vertigo as patient neck ROM and behaviorally will tolerate.   Baseline Target date 06/16/2015   Time 4   Period Weeks   Status Achieved           PT Long Term Goals - 08/09/15 0830    PT LONG TERM GOAL #1   Title The patient will be independent with progression of HEP.   Baseline Patient is performing, however has decresed tolerance to all activities.   Time 8   Period Weeks   Status Partially Met   PT LONG TERM GOAL #2   Title The patient will improve gait speed from 1.28 ft/sec to > or equal to 2.2 ft/sec to demo improving mobility.   Baseline Modified target date 08/31/2015  * 2.10 ft/sec on 08/09/2015*   Time 8   Period Weeks   Status On-going   PT LONG TERM GOAL #3   Title The patient will improve functional status score from 12% to > or equal to 30% to demo decreased self  perception of disability.   Baseline Modified target date 08/31/2015   Time 8   Period Weeks   Status On-going   PT LONG TERM GOAL #4   Title The patient will report dizziness 4/10 at baseline (8/10 on eval)..   Baseline Modified target date 08/31/2015   Time 8   Period Weeks   Status On-going   PT LONG TERM GOAL #5   Title Improve A/ROM cervical spine to > or equal to 35 degrees bilaterally.   Baseline Patient has >45 degrees A/ROMm rotation bilaterally.   Time 8   Period Weeks   Status Achieved   PT LONG TERM GOAL #6   Title Report decreased pain from HA from 10/10 down to < or equal to 6/10.   Baseline Patient has MD management of headaches.   Time 8   Period Weeks   Status Deferred               Plan - 08/09/15 3754    Clinical Impression Statement PT progressed HEP to include gaze adaptation x 1 viewing.  PT recommended patient continue working towards LTG via HEP and recommended she continue to pursue psychiatric appointment.  She also has new onset tooth pain, which is making pain overall worse.  PT to check goals next visit and plan towards d/c.   PT Next Visit Plan Check goals, determine need for continued PT.   Consulted and Agree with Plan of Care Patient        Problem List Patient Active Problem List   Diagnosis Date Noted  . Concussion with loss of consciousness 07/02/2015  . Cigarette smoker 04/07/2015  . Upper airway cough syndrome 04/07/2015  . Dyspnea 04/07/2015  . Solitary pulmonary nodule 04/06/2015  . Headache 01/26/2015  . Intractable vascular headache 01/26/2015  . Blurry vision, bilateral 01/26/2015  . Eye pain 01/26/2015  . Paresthesias 01/26/2015  . Weakness 01/26/2015  . Endometriosis 11/20/2013  . Pelvic pain in female 09/18/2013   . DEPRESSION 09/07/2010  . BIPOLAR DISORDER UNSPECIFIED 07/13/2009  . ABDOMINAL WALL HERNIA 06/17/2009  . TREMOR 06/17/2009  . ACUTE CYSTITIS 03/08/2009  . SICKLE CELL TRAIT 03/04/2009  . NECK PAIN 03/04/2009  . ABDOMINAL PAIN, GENERALIZED 03/04/2009  . HYPERGLYCEMIA 12/11/2008  . SCOLIOSIS , IDIOPATHIC 11/24/2008  . CHRONIC PAIN SYNDROME 11/09/2008  . INSOMNIA 11/09/2008  . ARTHRALGIA 10/30/2008    Ishmael Berkovich. PT 08/09/2015, 8:56 AM  [facility] [address] [place], [place], [address] Phone: [phone]   Fax:  [phone]  Name: Kristen [name] MRN: [id number] Date of Birth: 1981/01/19

## 2015-08-09 NOTE — Patient Instructions (Signed)
Gaze Stabilization: Tip Card 1.Target must remain in focus, not blurry, and appear stationary while head is in motion. 2.Perform exercises with small head movements (45 to either side of midline). 3.Increase speed of head motion so long as target is in focus. 4.If you wear eyeglasses, be sure you can see target through lens (therapist will give specific instructions for bifocal / progressive lenses). 5.These exercises may provoke dizziness or nausea. Work through these symptoms. If too dizzy, slow head movement slightly. Rest between each exercise. 6.Exercises demand concentration; avoid distractions. 7.For safety, perform standing exercises close to a counter, wall, corner, or next to someone.  Copyright  VHI. All rights reserved.  Gaze Stabilization: Standing Feet Apart   Feet shoulder width apart, keeping eyes on target on wall 3 feet away, tilt head down slightly and move head side to side for 30 seconds. Repeat while moving head up and down for 30 seconds. Do 2 sessions per day.   Copyright  VHI. All rights reserved.   Walking Program:  Begin walking for exercise for 10 minutes, 2 times/day, 5 days/week.   Progress your walking program by adding 2-3 minutes to your routine each week, as tolerated. Be sure to wear good walking shoes, walk in a safe environment and only progress to your tolerance.

## 2015-08-23 ENCOUNTER — Ambulatory Visit
Payer: Medicare Other | Attending: Physical Medicine and Rehabilitation | Admitting: Rehabilitative and Restorative Service Providers"

## 2015-08-23 ENCOUNTER — Encounter: Payer: Self-pay | Admitting: Rehabilitative and Restorative Service Providers"

## 2015-08-23 VITALS — BP 120/83

## 2015-08-23 DIAGNOSIS — R293 Abnormal posture: Secondary | ICD-10-CM

## 2015-08-23 DIAGNOSIS — R42 Dizziness and giddiness: Secondary | ICD-10-CM | POA: Diagnosis present

## 2015-08-23 DIAGNOSIS — R269 Unspecified abnormalities of gait and mobility: Secondary | ICD-10-CM | POA: Diagnosis not present

## 2015-08-23 DIAGNOSIS — M542 Cervicalgia: Secondary | ICD-10-CM | POA: Insufficient documentation
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
## 2015-08-23 NOTE — Addendum Note (Signed)
Addended by: Rudell Cobb M on: 08/23/2015 10:10 AM   Modules accepted: Orders

## 2015-08-23 NOTE — Patient Instructions (Signed)
No further PT visits are scheduled.  You should continue with walking, neck movements and exercises for dizziness and eye motion (from handouts).  Follow up with one of your doctors about the referral to psychiatry.  If headaches continue to limit your ability to do your exercises, schedule a follow-up visit with neurology.   Dr. Tomi Likens at [facility] [phone]   Dr. Everardo Beals at [facility] [phone]

## 2015-08-23 NOTE — Therapy (Signed)
[facility] [address] [place], [place], [address] Phone: [phone]   Fax:  [phone]  Physical Therapy Treatment  Patient Details  Name: Kristen [name] MRN: [id number] Date of Birth: 1980/12/21 Referring Provider: Dr. Joselyn Arrow  Encounter Date: 08/23/2015      PT End of Session - 08/23/15 0812    Visit Number 8   Number of Visits 16   Date for PT Re-Evaluation 07/16/15   Authorization Type G code every 10th visit   PT Start Time 0802   PT Stop Time 0842   PT Time Calculation (min) 40 min   Activity Tolerance Patient limited by pain   Behavior During Therapy Anxious;Agitated      Past Medical History  Diagnosis Date  . Sickle cell trait (HCC)   . Borderline diabetes     no meds  . Scoliosis   . Hernia, umbilical   . Depression   . Anxiety   . Panic attack   . OCD (obsessive compulsive disorder)   . Fibromyalgia   . Carpal tunnel syndrome on both sides   . Fibroid, uterine   . GERD (gastroesophageal reflux disease)   . Bipolar disorder (HCC)   . Hx of bronchitis   . Headache(784.0)     otc meds prn  . Arthritis     lower back, hands, feet  . Anemia     history  . Umbilical hernia     Past Surgical History  Procedure Laterality Date  . Wisdom tooth extraction    . Back surgery      2 rods in back -scoliosis  . Laparoscopy N/A 10/29/2013    Procedure: LAPAROSCOPY DIAGNOSTIC;  Surgeon: Reva Bores, MD;  Location: [facility];  Service: Gynecology;  Laterality: N/A;  . Abdominal hysterectomy N/A 02/10/2014    Procedure: HYSTERECTOMY ABDOMINAL;  Surgeon: Reva Bores, MD;  Location: [facility];  Service: Gynecology;  Laterality: N/A;  . Salpingoophorectomy Bilateral 02/10/2014    Procedure: SALPINGO OOPHORECTOMY;  Surgeon: Reva Bores, MD;  Location: [facility];  Service: Gynecology;  Laterality: Bilateral;  . Umbilical hernia repair N/A 02/10/2014    Procedure: HERNIA REPAIR UMBILICAL ADULT;   Surgeon: Reva Bores, MD;  Location: [facility];  Service: Gynecology;  Laterality: N/A;    Filed Vitals:   08/23/15 0807  BP: 120/83    Visit Diagnosis:  Abnormality of gait  Dizziness and giddiness  Neck pain  Postural instability      Subjective Assessment - 08/23/15 0808    Subjective The patient arrived with gas pain today reporting chest pain that she thinks is related to indigestion.  BP taken and WNLs.  She has not been seen by psychiatrist due to reporting that doctor does not accept her insurance.  PT recommended she call her referring MD to get a new referral.  The patient reports dizziness and nausea still limit her.  She also notes that her memory is limiting her day to day activities.    Patient Stated Goals "Just help me" be able to heal.   Currently in Pain? Yes   Pain Score 9   9.5/10   Pain Location Head   Pain Orientation Posterior;Lower   Pain Descriptors / Indicators Aching   Pain Type Acute pain   Pain Onset More than a month ago   Pain Frequency Constant   Aggravating Factors  movement, stress, light   Pain Relieving Factors no movement, reduced stress, no light  THERAPEUTIC EXERCISE: Reviewed HEP for neck stretching A/ROM in all planes and shoulder rolls     [facility] PT Assessment - 08/23/15 0821    AROM   Cervical Flexion 40 degrees   Cervical Extension 50 degrees   Cervical - Right Rotation approximately 60 degrees   Cervical - Left Rotation approximately 60 degrees.   Ambulation/Gait   Ambulation/Gait Yes   Ambulation/Gait Assistance 7: Independent   Ambulation Distance (Feet) 200 Feet   Ambulation Surface Level   Gait velocity 2.68 ft/sec      NEUROMUSCULAR RE-EDUCATION: REviewed habituation exercises of rolling, sit<>sidelying, gaze adaptation.  SELF CARE/HOME MANAGEMENT: Discussed rationale for holding further therapy at this time as no recent progression of home exercises has been tolerated.  PT recommended completing  psychiatric referral from Dr. Everardo Beals from initial evaluation.  Discussed continuing to increase movements t/o the day to her tolerance.           PT Education - 08/23/15 [phone]    Education provided Yes   Education Details barriers to further progress in therapy needing to be addressed to gain optimal benefit.  Patient unsure why therapy ending.  PT discussed barriers to progress.   Person(s) Educated Patient   Methods Explanation   Comprehension Verbalized understanding          PT Short Term Goals - 06/16/15 0925    PT SHORT TERM GOAL #1   Title The patient will return demo HEP for neck A/ROM, gentle stretching for posture, and dizziness as tolerated.   Baseline Target date 06/16/2015   Time 4   Period Weeks   Status Achieved   PT SHORT TERM GOAL #2   Title The patient will improve neck A/ROM supine to 20 degrees bilateral cervical rotation.   Baseline Sitting, the patient can perform 30 degrees of motion to each direction.   Time 4   Period Weeks   Status Achieved   PT SHORT TERM GOAL #3   Title The patient will tolerate neck A/ROM for further assessment for flexion/extension and sidebending.   Baseline Met on 06/16/2015 with cervical flexion/extension approximately 10 degrees, sidebending 10 degrees to each side.   Time 4   Period Weeks   Status Achieved   PT SHORT TERM GOAL #4   Title The patient will improve gait speed from 1.28 ft/sec to > or equal to 1.8 ft/sec to demo improving functional mobility.   Baseline Target date 06/16/2015   Time 4   Period Weeks   Status On-going   PT SHORT TERM GOAL #5   Title The patient will report decreased reliance on cervical collar to improve postural stability (wearing <50% of time out of bed).   Baseline Patient using neck brace for <50% of wake time.   Time 4   Period Weeks   Status Achieved   PT SHORT TERM GOAL #6   Title Further assess vertigo as patient neck ROM and behaviorally will tolerate.   Baseline Target date  06/16/2015   Time 4   Period Weeks   Status Achieved           PT Long Term Goals - 08/23/15 0813    PT LONG TERM GOAL #1   Title The patient will be independent with progression of HEP.   Baseline Patient is performing, however has decresed tolerance to all activities.   Time 8   Period Weeks   Status Partially Met   PT LONG TERM GOAL #2   Title The patient  will improve gait speed from 1.28 ft/sec to > or equal to 2.2 ft/sec to demo improving mobility.   Baseline 2.68 ft/sec on 09-14-2015   Time 8   Period Weeks   Status Achieved   PT LONG TERM GOAL #3   Title The patient will improve functional status score from 12% to > or equal to 30% to demo decreased self perception of disability.   Baseline Patient did not retake survey as psychiatric and headaches are limiting her and she needs further evaluation    Time 8   Period Weeks   Status Deferred   PT LONG TERM GOAL #4   Title The patient will report dizziness 4/10 at baseline (8/10 on eval)..   Baseline Patient still reporting 10/10 dizziness this morning reporting the head makes her feel bad.    Time 8   Period Weeks   Status Not Met   PT LONG TERM GOAL #5   Title Improve A/ROM cervical spine to > or equal to 35 degrees bilaterally.   Baseline Patient has >45 degrees A/ROMm rotation bilaterally.   Time 8   Period Weeks   Status Achieved   PT LONG TERM GOAL #6   Title Report decreased pain from HA from 10/10 down to < or equal to 6/10.   Baseline Patient has MD management of headaches.   Time 8   Period Weeks   Status Deferred               Plan - 14-Sep-2015 0943    Clinical Impression Statement The patient has partially met LTGs.  She has factors limiting further progression of activities in PT including headaches, anxiety (patient reports she did not f/u with psychiatrist referral and needs to speak to MD), and memory issues limiting her ability to carryover exercises to home.  PT recommends we discharge at  this time with patient continuing current HEP.   PT also recommended  she call her referring provider to determine psychiatrist referral information.     Clinical Impairments Affecting Rehab Potential patient limited by anxiety and pain   PT Next Visit Plan Discharge at this time with current HEP.   Consulted and Agree with Plan of Care Patient          G-Codes - 09-14-2015 0814    Functional Assessment Tool Used Gait speed=2.68 ft/sec   Functional Limitation Mobility: Walking and moving around   Mobility: Walking and Moving Around Goal Status [phone]) At least 20 percent but less than 40 percent impaired, limited or restricted   Mobility: Walking and Moving Around Discharge Status [phone]) At least 20 percent but less than 40 percent impaired, limited or restricted      Problem List Patient Active Problem List   Diagnosis Date Noted  . Concussion with loss of consciousness 07/02/2015  . Cigarette smoker 04/07/2015  . Upper airway cough syndrome 04/07/2015  . Dyspnea 04/07/2015  . Solitary pulmonary nodule 04/06/2015  . Headache 01/26/2015  . Intractable vascular headache 01/26/2015  . Blurry vision, bilateral 01/26/2015  . Eye pain 01/26/2015  . Paresthesias 01/26/2015  . Weakness 01/26/2015  . Endometriosis 11/20/2013  . Pelvic pain in female 09/18/2013  . DEPRESSION 09/07/2010  . BIPOLAR DISORDER UNSPECIFIED 07/13/2009  . ABDOMINAL WALL HERNIA 06/17/2009  . TREMOR 06/17/2009  . ACUTE CYSTITIS 03/08/2009  . SICKLE CELL TRAIT 03/04/2009  . NECK PAIN 03/04/2009  . ABDOMINAL PAIN, GENERALIZED 03/04/2009  . HYPERGLYCEMIA 12/11/2008  . SCOLIOSIS , IDIOPATHIC 11/24/2008  . CHRONIC  PAIN SYNDROME 11/09/2008  . INSOMNIA 11/09/2008  . ARTHRALGIA 10/30/2008    Ajna Moors, PT 08/23/2015, 9:52 AM  [facility] [address] [place], [place], [address] Phone: [phone]   Fax:  [phone]  Name: Kristen [name] MRN: [id number] Date of Birth: 10-09-1980

## 2015-08-23 NOTE — Therapy (Signed)
[facility] [address] [place], [place], [address] Phone: [phone]   Fax:  [phone]  Patient Details  Name: Kristen [name] MRN: [id number] Date of Birth: May 10, 1981 Referring Provider:  No ref. provider found  Encounter Date: 08/23/2015  PHYSICAL THERAPY DISCHARGE SUMMARY  Visits from Start of Care: 8  Current functional level related to goals / functional outcomes:     PT Short Term Goals - 06/16/15 6433    PT SHORT TERM GOAL #1   Title The patient will return demo HEP for neck A/ROM, gentle stretching for posture, and dizziness as tolerated.   Baseline Target date 06/16/2015   Time 4   Period Weeks   Status Achieved   PT SHORT TERM GOAL #2   Title The patient will improve neck A/ROM supine to 20 degrees bilateral cervical rotation.   Baseline Sitting, the patient can perform 30 degrees of motion to each direction.   Time 4   Period Weeks   Status Achieved   PT SHORT TERM GOAL #3   Title The patient will tolerate neck A/ROM for further assessment for flexion/extension and sidebending.   Baseline Met on 06/16/2015 with cervical flexion/extension approximately 10 degrees, sidebending 10 degrees to each side.   Time 4   Period Weeks   Status Achieved   PT SHORT TERM GOAL #4   Title The patient will improve gait speed from 1.28 ft/sec to > or equal to 1.8 ft/sec to demo improving functional mobility.   Baseline Target date 06/16/2015   Time 4   Period Weeks   Status On-going   PT SHORT TERM GOAL #5   Title The patient will report decreased reliance on cervical collar to improve postural stability (wearing <50% of time out of bed).   Baseline Patient using neck brace for <50% of wake time.   Time 4   Period Weeks   Status Achieved   PT SHORT TERM GOAL #6   Title Further assess vertigo as patient neck ROM and behaviorally will tolerate.   Baseline Target date 06/16/2015   Time 4   Period Weeks   Status Achieved         PT Long Term Goals - 08/23/15 0813    PT LONG TERM GOAL #1   Title The patient will be independent with progression of HEP.   Baseline Patient is performing, however has decresed tolerance to all activities.   Time 8   Period Weeks   Status Partially Met   PT LONG TERM GOAL #2   Title The patient will improve gait speed from 1.28 ft/sec to > or equal to 2.2 ft/sec to demo improving mobility.   Baseline 2.68 ft/sec on 08/23/2015   Time 8   Period Weeks   Status Achieved   PT LONG TERM GOAL #3   Title The patient will improve functional status score from 12% to > or equal to 30% to demo decreased self perception of disability.   Baseline Patient did not retake survey as psychiatric and headaches are limiting her and she needs further evaluation    Time 8   Period Weeks   Status Deferred   PT LONG TERM GOAL #4   Title The patient will report dizziness 4/10 at baseline (8/10 on eval)..   Baseline Patient still reporting 10/10 dizziness this morning reporting the head makes her feel bad.    Time 8   Period Weeks   Status Not Met   PT  LONG TERM GOAL #5   Title Improve A/ROM cervical spine to > or equal to 35 degrees bilaterally.   Baseline Patient has >45 degrees A/ROMm rotation bilaterally.   Time 8   Period Weeks   Status Achieved   PT LONG TERM GOAL #6   Title Report decreased pain from HA from 10/10 down to < or equal to 6/10.   Baseline Patient has MD management of headaches.   Time 8   Period Weeks   Status Deferred        Remaining deficits: Headaches limiting further tolerance to activity. Anxiety limiting further tolerance to exercises and hindering progression.   Education / Equipment: HEP, home walking, need to f/u with initial MD recommendations.  Plan: Patient agrees to discharge.  Patient goals were partially met. Patient is being discharged due to meeting the stated rehab goals.  ?????       Thank you for the referral of this  patient. Rudell Cobb, MPT   Cambell Stanek 08/23/2015, 10:04 AM  [facility] [address] [place], [place], [address] Phone: [phone]   Fax:  [phone]

## 2015-09-10 ENCOUNTER — Telehealth: Payer: Self-pay | Admitting: Neurology

## 2015-09-10 NOTE — Telephone Encounter (Signed)
Printed and placed at front desk

## 2015-09-10 NOTE — Telephone Encounter (Signed)
Patient returned call. Please call at [phone]

## 2015-09-10 NOTE — Telephone Encounter (Signed)
Did not call patient. Will call back and let her know.

## 2015-09-10 NOTE — Telephone Encounter (Signed)
Patient would like medical records printed.

## 2015-10-06 ENCOUNTER — Emergency Department (HOSPITAL_COMMUNITY): Payer: Medicare Other

## 2015-10-06 ENCOUNTER — Encounter (HOSPITAL_COMMUNITY): Payer: Self-pay | Admitting: Emergency Medicine

## 2015-10-06 ENCOUNTER — Emergency Department (HOSPITAL_COMMUNITY)
Admission: EM | Admit: 2015-10-06 | Discharge: 2015-10-06 | Disposition: A | Discharge status: Home / Self Care | Payer: Medicare Other | Attending: Emergency Medicine | Admitting: Emergency Medicine

## 2015-10-06 DIAGNOSIS — S199XXA Unspecified injury of neck, initial encounter: Secondary | ICD-10-CM | POA: Diagnosis not present

## 2015-10-06 DIAGNOSIS — Z793 Long term (current) use of hormonal contraceptives: Secondary | ICD-10-CM | POA: Diagnosis not present

## 2015-10-06 DIAGNOSIS — S3992XA Unspecified injury of lower back, initial encounter: Secondary | ICD-10-CM | POA: Diagnosis not present

## 2015-10-06 DIAGNOSIS — M797 Fibromyalgia: Secondary | ICD-10-CM | POA: Insufficient documentation

## 2015-10-06 DIAGNOSIS — M19042 Primary osteoarthritis, left hand: Secondary | ICD-10-CM | POA: Diagnosis not present

## 2015-10-06 DIAGNOSIS — Y9289 Other specified places as the place of occurrence of the external cause: Secondary | ICD-10-CM | POA: Diagnosis not present

## 2015-10-06 DIAGNOSIS — K219 Gastro-esophageal reflux disease without esophagitis: Secondary | ICD-10-CM | POA: Insufficient documentation

## 2015-10-06 DIAGNOSIS — S20312A Abrasion of left front wall of thorax, initial encounter: Secondary | ICD-10-CM | POA: Diagnosis not present

## 2015-10-06 DIAGNOSIS — Z86018 Personal history of other benign neoplasm: Secondary | ICD-10-CM | POA: Diagnosis not present

## 2015-10-06 DIAGNOSIS — F319 Bipolar disorder, unspecified: Secondary | ICD-10-CM | POA: Insufficient documentation

## 2015-10-06 DIAGNOSIS — M549 Dorsalgia, unspecified: Secondary | ICD-10-CM

## 2015-10-06 DIAGNOSIS — S3991XA Unspecified injury of abdomen, initial encounter: Secondary | ICD-10-CM | POA: Insufficient documentation

## 2015-10-06 DIAGNOSIS — Z862 Personal history of diseases of the blood and blood-forming organs and certain disorders involving the immune mechanism: Secondary | ICD-10-CM | POA: Diagnosis not present

## 2015-10-06 DIAGNOSIS — M19071 Primary osteoarthritis, right ankle and foot: Secondary | ICD-10-CM | POA: Insufficient documentation

## 2015-10-06 DIAGNOSIS — S8991XA Unspecified injury of right lower leg, initial encounter: Secondary | ICD-10-CM | POA: Insufficient documentation

## 2015-10-06 DIAGNOSIS — M19041 Primary osteoarthritis, right hand: Secondary | ICD-10-CM | POA: Insufficient documentation

## 2015-10-06 DIAGNOSIS — Z79899 Other long term (current) drug therapy: Secondary | ICD-10-CM | POA: Diagnosis not present

## 2015-10-06 DIAGNOSIS — M542 Cervicalgia: Secondary | ICD-10-CM

## 2015-10-06 DIAGNOSIS — Z7952 Long term (current) use of systemic steroids: Secondary | ICD-10-CM | POA: Diagnosis not present

## 2015-10-06 DIAGNOSIS — S299XXA Unspecified injury of thorax, initial encounter: Secondary | ICD-10-CM | POA: Diagnosis present

## 2015-10-06 DIAGNOSIS — Y998 Other external cause status: Secondary | ICD-10-CM | POA: Insufficient documentation

## 2015-10-06 DIAGNOSIS — Y9389 Activity, other specified: Secondary | ICD-10-CM | POA: Diagnosis not present

## 2015-10-06 DIAGNOSIS — M19072 Primary osteoarthritis, left ankle and foot: Secondary | ICD-10-CM | POA: Insufficient documentation

## 2015-10-06 DIAGNOSIS — F410 Panic disorder [episodic paroxysmal anxiety] without agoraphobia: Secondary | ICD-10-CM | POA: Insufficient documentation

## 2015-10-06 DIAGNOSIS — F17210 Nicotine dependence, cigarettes, uncomplicated: Secondary | ICD-10-CM | POA: Diagnosis not present

## 2015-10-06 DIAGNOSIS — Z8709 Personal history of other diseases of the respiratory system: Secondary | ICD-10-CM | POA: Diagnosis not present

## 2015-10-06 DIAGNOSIS — Z8669 Personal history of other diseases of the nervous system and sense organs: Secondary | ICD-10-CM | POA: Diagnosis not present

## 2015-10-06 DIAGNOSIS — S79912A Unspecified injury of left hip, initial encounter: Secondary | ICD-10-CM | POA: Insufficient documentation

## 2015-10-06 MED ORDER — IBUPROFEN 400 MG PO TABS: 400.0000 mg | ORAL_TABLET | Freq: Four times a day (QID) | ORAL | Status: AC | PRN

## 2015-10-06 MED ORDER — CYCLOBENZAPRINE HCL 10 MG PO TABS: 10.0000 mg | ORAL_TABLET | Freq: Two times a day (BID) | ORAL | Status: AC | PRN

## 2015-10-06 MED ORDER — HYDROCODONE-ACETAMINOPHEN 5-325 MG PO TABS: 1.0000 | ORAL_TABLET | Freq: Four times a day (QID) | ORAL | Status: DC | PRN

## 2015-10-06 MED ORDER — DIAZEPAM 5 MG PO TABS
5.0000 mg | ORAL_TABLET | Freq: Once | ORAL | Status: AC
  Administered 2015-10-06: 5 mg via ORAL
  Filled 2015-10-06: qty 1
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
## 2015-10-06 NOTE — Discharge Instructions (Signed)
Soft Tissue Injury of the Neck A soft tissue injury of the neck may be either blunt or penetrating. A blunt injury does not break the skin. A penetrating injury breaks the skin, creating an open wound. Blunt injuries may happen in several ways. Most involve some type of direct blow to the neck. This can cause serious injury to the windpipe, voice box, cervical spine, or esophagus. In some cases, the injury to the soft tissue can also result in a break (fracture) of the cervical spine.  Soft tissue injuries of the neck require immediate medical care. Sometimes, you may not notice the signs of injury right away. You may feel fine at first, but the swelling may eventually close off your airway. This could result in a significant or life-threatening injury. This is rare, but it is important to keep in mind with any injury to the neck.  CAUSES  Causes of blunt injury may include:  "Clothesline" injuries. This happens when someone is moving at high speed and runs into a clothesline, outstretched arm, or similar object. This results in a direct injury to the front of the neck. If the airway is blocked, it can cause suffocation due to lack of oxygen (asphyxiation) or even instant death.  High-energy trauma. This includes injuries from motor vehicle crashes, falling from a great height, or heavy objects falling onto the neck.  Sports-related injuries. Injury to the windpipe and voice box can result from being struck by another player or being struck by an object, such as a baseball, hockey stick, or an outstretched arm.  Strangulation. This type of injury may cause skin trauma, hoarseness of voice, or broken cartilage in the voice box or windpipe. It may also cause a serious airway problem. SYMPTOMS   Bruising.  Pain and tenderness in the neck.  Swelling of the neck and face.  Hoarseness of voice.  Pain or difficulty with swallowing.  Drooling or inability to swallow.  Trouble breathing. This may  become worse when lying flat.  Coughing up blood.  High-pitched, harsh, vibratory noise due to partial obstruction of the windpipe (stridor).  Swelling of the upper arms.  Windpipe that appears to be pushed off to one side.  Air in the tissues under the skin of the neck or chest (subcutaneous emphysema). This usually indicates a problem with the normal airway and is a medical emergency. DIAGNOSIS   If possible, your caregiver may ask about the details of how the injury occurred. A detailed exam can help to identify specific areas of the neck that are injured.  Your caregiver may ask for tests to rule out injury of the voice box, airway, or esophagus. This may include X-rays, ultrasounds, CT scans, or MRI scans, depending on the severity of your injury. TREATMENT  If you have an injury to your windpipe or voice box, immediate medical care is required. In almost all cases, hospitalization is necessary. For injuries that do not appear to require surgery, it is helpful to have medical observation for 24 hours. You may be asked to do one or more of the following:  Rest your voice.  Bed rest.  Limit your diet, depending on the extent of the injury. Follow your caregiver's dietary guidelines. Often, only fluids and soft foods are recommended.  Keep your head raised.  Breathe humidified air.  Take medicines to control infection, reduce swelling, and reduce normal stomach acid. You may also need pain medicine, depending on your injury. For injuries that appear to require surgery,  you will need to stay in the hospital. The exact type of procedure needed will depend on your exact injury or injuries.  HOME CARE INSTRUCTIONS   If the skin was broken, keep the wound area clean and dry. Wear your bandage (dressing) and care for your wound as instructed.  Follow your caregiver's advice about your diet.  Follow your caregiver's advice about use of your voice.  Take medicines as  directed.  Keep your head and neck at least partially raised (elevated) while recovering. This should also be done while sleeping. SEEK MEDICAL CARE IF:   Your voice becomes weaker.  Your swelling or bruising is not improving as expected. Typically, this takes several days to improve.  You feel that you are having problems with medicines prescribed.  You have drainage from the injury site. This may be a sign that your wound is not healing properly or is infected.  You develop increasing pain or difficulty while swallowing.  You develop an oral temperature of 102 F (38.9 C) or higher. SEEK IMMEDIATE MEDICAL CARE IF:   You cough up blood.  You develop sudden trouble breathing.  You cannot tolerate your oral medicines, or you are unable to swallow.  You develop drooling.  You have new or worsening vomiting.  You develop sudden, new swelling of the neck or face.  You have an oral temperature above 102 F (38.9 C), not controlled by medicine. MAKE SURE YOU:  Understand these instructions.  Will watch your condition.  Will get help right away if you are not doing well or get worse.   This information is not intended to replace advice given to you by your health care provider. Make sure you discuss any questions you have with your health care provider.   Document Released: 09/12/2007 Document Revised: 08/28/2011 Document Reviewed: 12/23/2014 Elsevier Interactive Patient Education 2016 [place] A contusion is a deep bruise. Contusions are the result of a blunt injury to tissues and muscle fibers under the skin. The injury causes bleeding under the skin. The skin overlying the contusion may turn blue, purple, or yellow. Minor injuries will give you a painless contusion, but more severe contusions may stay painful and swollen for a few weeks.  CAUSES  This condition is usually caused by a blow, trauma, or direct force to an area of the body. SYMPTOMS   Symptoms of this condition include:  Swelling of the injured area.  Pain and tenderness in the injured area.  Discoloration. The area may have redness and then turn blue, purple, or yellow. DIAGNOSIS  This condition is diagnosed based on a physical exam and medical history. An X-ray, CT scan, or MRI may be needed to determine if there are any associated injuries, such as broken bones (fractures). TREATMENT  Specific treatment for this condition depends on what area of the body was injured. In general, the best treatment for a contusion is resting, icing, applying pressure to (compression), and elevating the injured area. This is often called the RICE strategy. Over-the-counter anti-inflammatory medicines may also be recommended for pain control.  HOME CARE INSTRUCTIONS   Rest the injured area.  If directed, apply ice to the injured area:  Put ice in a plastic bag.  Place a towel between your skin and the bag.  Leave the ice on for 20 minutes, 2-3 times per day.  If directed, apply light compression to the injured area using an elastic bandage. Make sure the bandage is not wrapped too  tightly. Remove and reapply the bandage as directed by your health care provider.  If possible, raise (elevate) the injured area above the level of your heart while you are sitting or lying down.  Take over-the-counter and prescription medicines only as told by your health care provider. SEEK MEDICAL CARE IF:  Your symptoms do not improve after several days of treatment.  Your symptoms get worse.  You have difficulty moving the injured area. SEEK IMMEDIATE MEDICAL CARE IF:   You have severe pain.  You have numbness in a hand or foot.  Your hand or foot turns pale or cold.   This information is not intended to replace advice given to you by your health care provider. Make sure you discuss any questions you have with your health care provider.   Document Released: 03/15/2005 Document Revised:  02/24/2015 Document Reviewed: 10/21/2014 Elsevier Interactive Patient Education 2016 Elsevier Inc.   Musculoskeletal Pain Musculoskeletal pain is muscle and boney aches and pains. These pains can occur in any part of the body. Your caregiver may treat you without knowing the cause of the pain. They may treat you if blood or urine tests, X-rays, and other tests were normal.  CAUSES There is often not a definite cause or reason for these pains. These pains may be caused by a type of germ (virus). The discomfort may also come from overuse. Overuse includes working out too hard when your body is not fit. Boney aches also come from weather changes. Bone is sensitive to atmospheric pressure changes. HOME CARE INSTRUCTIONS   Ask when your test results will be ready. Make sure you get your test results.  Only take over-the-counter or prescription medicines for pain, discomfort, or fever as directed by your caregiver. If you were given medications for your condition, do not drive, operate machinery or power tools, or sign legal documents for 24 hours. Do not drink alcohol. Do not take sleeping pills or other medications that may interfere with treatment.  Continue all activities unless the activities cause more pain. When the pain lessens, slowly resume normal activities. Gradually increase the intensity and duration of the activities or exercise.  During periods of severe pain, bed rest may be helpful. Lay or sit in any position that is comfortable.  Putting ice on the injured area.  Put ice in a bag.  Place a towel between your skin and the bag.  Leave the ice on for 15 to 20 minutes, 3 to 4 times a day.  Follow up with your caregiver for continued problems and no reason can be found for the pain. If the pain becomes worse or does not go away, it may be necessary to repeat tests or do additional testing. Your caregiver may need to look further for a possible cause. SEEK IMMEDIATE MEDICAL CARE  IF:  You have pain that is getting worse and is not relieved by medications.  You develop chest pain that is associated with shortness or breath, sweating, feeling sick to your stomach (nauseous), or throw up (vomit).  Your pain becomes localized to the abdomen.  You develop any new symptoms that seem different or that concern you. MAKE SURE YOU:   Understand these instructions.  Will watch your condition.  Will get help right away if you are not doing well or get worse.   This information is not intended to replace advice given to you by your health care provider. Make sure you discuss any questions you have with your health care  provider.   Document Released: 06/05/2005 Document Revised: 08/28/2011 Document Reviewed: 02/07/2013 Elsevier Interactive Patient Education 2016 [place] [facility] [employer] 211 is a great source of information about community services available.  Access by dialing 2-1-1 from anywhere in [place], or by website -  CustodianSupply.fi.   Other Local Resources (Updated 06/2015)  [employer]  Crisis Hotline, available 24 hours a day, 7 days a week: [place], [place]   [facility]  Crisis Hotline, available 24 hours a day, 7 days a week: [place], [place]  [facility]  Suicide Prevention Hotline, available 24 hours a day, 7 days a week: [place], [place], available 24 hours a day, 7 days a week: [place], [place] Access to [employer], available 24 hours a day, 7 days a week: (408)302-2424 All   [facility]  Crisis Hotline, available 24 hours a day, 7 days a week: (484) 255-3296 All   Other Local Resources (Updated 06/2015)  Outpatient Counseling/  Substance Abuse Programs  Services     Address and Phone Number  ADS ([facility] Drug [facility])   Options include Individual counseling, group counseling, intensive outpatient program (several hours a day, several days a week)  Offers depression assessments  Provides methadone maintenance program [phone] [address], [place], [place] partial hospitalization/day treatment and DUI/DWI programs  [employer], private insurance [phone] [phone] [place], [place] [address]  [place] include intensive outpatient program (several hours a day, several days a week), outpatient treatment, DUI/DWI services, family education  Also has some services specifically for [employer] transitional housing  [phone] [address] [place], [place] [address]     [place] Medicare, private pay, and private insurance [phone] [address] [place], [place] [address]  [facility]  Services include individual counseling, substance abuse intensive outpatient program (several hours a day, several days a week), day treatment  Blinda Leatherwood, Medicaid, private insurance [phone] [address], [place], [place] [address]  [facility]   Offers substance abuse intensive outpatient program (several hours a day, several days a week), partial hospitalization program [phone] [address] [place], [place] [address]  [phone] [address] [place], [place] [address]  [phone] [place], [place] [address]  [phone], [place], [place] [address]  [facility]  Individual counseling only  Accepts private insurance only [phone] [address], [place], [place] [address]   [facility]  Methadone maintenance program [id number] [place], [place] [address]  [facility] providing substance abuse and mental health counseling  Accepts Medicaid, Medicare, private insurance  Offers sliding scale for uninsured [phone] [place], [place] in [place] individual counseling, and intensive in-home services [phone] [address] [place], [place] [address]  [facility] individual counseling, family counseling, group therapy, domestic violence counseling, consumer credit counseling  Accepts Medicare, Medicaid, private insurance  Offers sliding scale for uninsured [phone] [address]  [place], [place] [address]  [phone] [facility], [address] [place], [place]  [facility]  Offers individual, family and group counseling  3 locations - [place], [place], and [place]  [place] [address] [place], [place] [address]  [address] [place], [place] [address]  [place], [place] [address]  [facility]    Offers psychiatric assessment, 8-week Intensive Outpatient Program (several hours a day, several times a week, daytime or evenings), early recovery group, family Program, medication management  Private pay or private insurance only [phone], or  [phone] [address] [place], [place] [address]  [facility]  Offers individual, couples and family counseling  Accepts Medicaid, private insurance, and sliding scale for uninsured [phone] [address] [place], [place] [address]  Launa Flight, MD  Individual counseling  Private insurance [phone] [place], [place] [address]  [place] [facility]   Offers assessment, substance abuse treatment, and behavioral health treatment [phone]  [address] [place], [place] [address]  [place]  Individual counseling  Accepts private insurance [phone] [place], [place] [address]  [facility]  Individual counseling  Blinda Leatherwood, private insurance [phone] [place], [place] [address]  [place]    Offers intensive outpatient program (several hours a day, several times a week)  Private pay, private insurance [phone] [place], [place]  Individual counseling  Medicare, private insurance [phone] [address] [place], [place] [address]  [place]    Offers intensive outpatient program (several hours a day, several times a week) and partial hospitalization program [phone] [place], [place] [address]  Letta Moynahan, MD  Individual counseling [phone] [address] [place], [place] [address]  [facility] counseling to individuals, couples, and families  Accepts Medicare and private insurance; offers sliding scale for uninsured [phone] [place], [place] [address]  [facility]  Christian counseling [phone] [address], [place], [place] [address]  [facility] [employer] crisis counseling, individual counseling, group therapy, in-home therapy, domestic violence services, day treatment, DWI services, Conservation officer, nature (CST), Assertive Community Treatment Team (ACTT), substance abuse Intensive Outpatient Program (several hours a day, several times a week)  2 locations - [place] and [place], [place] [address]  [phone] [address] [place], [place] [address]  [place] counseling and group therapy  [employer] insurance, [place], [place] [phone] [address] [place], [place]  [facility]  Offers individual and family counseling  Offers LGBTQ services  Accepts private insurance and private pay [phone] [place], [place] [address]  [facility]    Offers individual counseling, group therapy, and outpatient detox  Accepts private insurance [phone] [place], [place] Medicare, private insurance [phone] [address], [place], [place] [address]  [employer]  Individual counseling  Accepts Medicare, private insurance [phone] [address] [place], [place] [address]  [facility] [place] substance abuse Intensive Outpatient Program (several hours a day, several times a week) [phone], or [phone] [place], [place]    General Assault Assault includes any behavior or physical attack--whether it is on purpose  or not--that results in injury to another person, damage to property, or both. This also includes assault that has not yet happened, but is planned to happen. Threats of assault may be physical, verbal, or written. They may be said or sent by:  Mail.  E-mail.  Text.  Social media.  Fax. The threats may be direct, implied, or understood. WHAT ARE THE DIFFERENT FORMS OF ASSAULT? Forms of assault include:  Physically assaulting a person. This includes physical threats to inflict physical harm as well as:  Slapping.  Hitting.  Poking.  Kicking.  Punching.  Pushing.  Sexually assaulting a person. Sexual assault is any sexual activity that a person is forced, threatened, or coerced to participate in. It may or may not involve physical contact with the person who is assaulting you. You are sexually assaulted if you are forced to have sexual contact of any  kind.  Damaging or destroying a person's assistive equipment, such as glasses, canes, or walkers.  Throwing or hitting objects.  Using or displaying a weapon to harm or threaten someone.  Using or displaying an object that appears to be a weapon in a threatening manner.  Using greater physical size or strength to intimidate someone.  Making intimidating or threatening gestures.  Bullying.  Hazing.  Using language that is intimidating, threatening, hostile, or abusive.  Stalking.  Restraining someone with force. WHAT SHOULD I DO IF I EXPERIENCE ASSAULT?  Report assaults, threats, and stalking to the police. Call your local emergency services (911 in the U.S[place] if you are in immediate danger or you need medical help.  You can work with a Chief Executive Officer or an advocate to get legal protection against someone who has assaulted you or threatened you with assault. Protection includes restraining orders and private addresses. Crimes against you, such as assault, can also be prosecuted through the courts. Laws will vary depending on where you live.   This information is not intended to replace advice given to you by your health care provider. Make sure you discuss any questions you have with your health care provider.   Document Released: 06/05/2005 Document Revised: 06/26/2014 Document Reviewed: 02/20/2014 Elsevier Interactive Patient Education [employer].
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
## 2016-04-04 ENCOUNTER — Ambulatory Visit: Payer: Medicare Other | Admitting: Neurology

## 2016-04-18 ENCOUNTER — Ambulatory Visit (INDEPENDENT_AMBULATORY_CARE_PROVIDER_SITE_OTHER): Payer: Medicare Other | Admitting: Neurology

## 2016-04-18 ENCOUNTER — Encounter: Payer: Self-pay | Admitting: Neurology

## 2016-04-18 VITALS — BP 118/79 | HR 63 | Ht 66.5 in | Wt 157.8 lb

## 2016-04-18 DIAGNOSIS — G8929 Other chronic pain: Secondary | ICD-10-CM | POA: Diagnosis not present

## 2016-04-18 DIAGNOSIS — F99 Mental disorder, not otherwise specified: Secondary | ICD-10-CM | POA: Diagnosis not present
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
## 2016-05-07 ENCOUNTER — Encounter (HOSPITAL_COMMUNITY): Payer: Self-pay | Admitting: Emergency Medicine

## 2016-05-07 ENCOUNTER — Emergency Department (HOSPITAL_COMMUNITY): Payer: Medicare Other

## 2016-05-07 ENCOUNTER — Emergency Department (HOSPITAL_COMMUNITY)
Admission: EM | Admit: 2016-05-07 | Discharge: 2016-05-07 | Disposition: A | Discharge status: Home / Self Care | Payer: Medicare Other | Attending: Emergency Medicine | Admitting: Emergency Medicine

## 2016-05-07 DIAGNOSIS — Z79899 Other long term (current) drug therapy: Secondary | ICD-10-CM | POA: Insufficient documentation

## 2016-05-07 DIAGNOSIS — Y999 Unspecified external cause status: Secondary | ICD-10-CM | POA: Insufficient documentation

## 2016-05-07 DIAGNOSIS — Y929 Unspecified place or not applicable: Secondary | ICD-10-CM | POA: Insufficient documentation

## 2016-05-07 DIAGNOSIS — Z23 Encounter for immunization: Secondary | ICD-10-CM | POA: Diagnosis not present

## 2016-05-07 DIAGNOSIS — S0182XA Laceration with foreign body of other part of head, initial encounter: Secondary | ICD-10-CM | POA: Insufficient documentation

## 2016-05-07 DIAGNOSIS — F17210 Nicotine dependence, cigarettes, uncomplicated: Secondary | ICD-10-CM | POA: Diagnosis not present

## 2016-05-07 DIAGNOSIS — Y939 Activity, unspecified: Secondary | ICD-10-CM | POA: Diagnosis not present

## 2016-05-07 DIAGNOSIS — S0990XA Unspecified injury of head, initial encounter: Secondary | ICD-10-CM | POA: Diagnosis present

## 2016-05-07 DIAGNOSIS — S40211A Abrasion of right shoulder, initial encounter: Secondary | ICD-10-CM | POA: Diagnosis not present

## 2016-05-07 DIAGNOSIS — S7011XA Contusion of right thigh, initial encounter: Secondary | ICD-10-CM | POA: Insufficient documentation

## 2016-05-07 MED ORDER — TETANUS-DIPHTH-ACELL PERTUSSIS 5-2.5-18.5 LF-MCG/0.5 IM SUSP
0.5000 mL | Freq: Once | INTRAMUSCULAR | Status: AC
  Administered 2016-05-07: 0.5 mL via INTRAMUSCULAR
  Filled 2016-05-07: qty 0.5

## 2016-05-07 MED ORDER — HYDROCODONE-ACETAMINOPHEN 5-325 MG PO TABS
1.0000 | ORAL_TABLET | Freq: Once | ORAL | Status: AC
  Administered 2016-05-07: 1 via ORAL
  Filled 2016-05-07: qty 1

## 2016-05-07 MED ORDER — HYDROCODONE-ACETAMINOPHEN 5-325 MG PO TABS: ORAL_TABLET | ORAL | 0 refills | Status: AC

## 2016-05-07 NOTE — ED Notes (Signed)
PT DISCHARGED. INSTRUCTIONS AND PRESCRIPTIONS GIVEN. AAOX4. PT IN NO APPARENT DISTRESS. THE OPPORTUNITY TO ASK QUESTIONS WAS PROVIDED. 
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
## 2016-05-07 NOTE — Discharge Instructions (Signed)
Please follow with your primary care doctor in the next 2 days for a check-up. They must obtain records for further management.  ° °Do not hesitate to return to the Emergency Department for any new, worsening or concerning symptoms.  ° °

## 2016-10-25 ENCOUNTER — Other Ambulatory Visit: Payer: Self-pay | Admitting: Neurology

## 2016-10-27 ENCOUNTER — Telehealth: Payer: Self-pay | Admitting: Neurology

## 2016-10-27 MED ORDER — MAGNESIUM OXIDE 400 MG PO TABS: 400.0000 mg | ORAL_TABLET | Freq: Every day | ORAL | 0 refills | Status: DC

## 2016-10-27 NOTE — Telephone Encounter (Signed)
Magnesium sent to [facility].  Appointment made.  Patient aware.

## 2016-10-27 NOTE — Telephone Encounter (Signed)
Caller: Patient  Urgent? Yes  Reason for the call: Prescription # [phone] Magnesium Oxide for her head. She said she called the pharmacy but they told her our office needed to send it over to them. She said she really needs it. Thanks

## 2016-11-14 ENCOUNTER — Ambulatory Visit (INDEPENDENT_AMBULATORY_CARE_PROVIDER_SITE_OTHER): Payer: Medicare Other | Admitting: Neurology

## 2016-11-14 ENCOUNTER — Telehealth: Payer: Self-pay | Admitting: *Deleted

## 2016-11-14 ENCOUNTER — Encounter: Payer: Self-pay | Admitting: Neurology

## 2016-11-14 VITALS — BP 120/78 | HR 78 | Ht 66.5 in | Wt 174.9 lb

## 2016-11-14 DIAGNOSIS — R51 Headache: Secondary | ICD-10-CM

## 2016-11-14 DIAGNOSIS — F419 Anxiety disorder, unspecified: Secondary | ICD-10-CM

## 2016-11-14 DIAGNOSIS — R519 Headache, unspecified: Secondary | ICD-10-CM

## 2016-11-14 DIAGNOSIS — F329 Major depressive disorder, single episode, unspecified: Secondary | ICD-10-CM | POA: Diagnosis not present

## 2016-11-14 DIAGNOSIS — F32A Depression, unspecified: Secondary | ICD-10-CM

## 2016-11-14 MED ORDER — MAGNESIUM OXIDE 400 MG PO TABS: 400.0000 mg | ORAL_TABLET | Freq: Every day | ORAL | 5 refills | Status: DC

## 2016-11-14 MED ORDER — RIBOFLAVIN 400 MG PO TABS: 400.0000 mg | ORAL_TABLET | Freq: Every day | ORAL | 5 refills | Status: DC

## 2016-11-14 MED ORDER — COENZYME Q10 100 MG PO TABS: ORAL_TABLET | ORAL | 5 refills | Status: DC

## 2016-11-14 NOTE — Patient Instructions (Signed)
1.  I refilled magnesium, riboflavin and coenzyme Q10 to treat depression. 2.  You need to treat the anxiety and depression (psychiatrist and therapist).  We will refer you. 3.  Limit use of pain relievers to no more than 2 days out of the week 4.  Follow up in 6 months.

## 2016-11-14 NOTE — Telephone Encounter (Signed)
Kristen [name] They stated patient has to call and schedule the appointment herself once she has done that they will then request records  Will call patient to inform
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
## 2016-11-29 ENCOUNTER — Emergency Department (HOSPITAL_COMMUNITY)
Admission: EM | Admit: 2016-11-29 | Discharge: 2016-11-29 | Disposition: A | Discharge status: Home / Self Care | Payer: Medicare Other | Attending: Emergency Medicine | Admitting: Emergency Medicine

## 2016-11-29 ENCOUNTER — Emergency Department (HOSPITAL_COMMUNITY): Payer: Medicare Other

## 2016-11-29 ENCOUNTER — Encounter (HOSPITAL_COMMUNITY): Payer: Self-pay | Admitting: Emergency Medicine

## 2016-11-29 DIAGNOSIS — F17210 Nicotine dependence, cigarettes, uncomplicated: Secondary | ICD-10-CM | POA: Insufficient documentation

## 2016-11-29 DIAGNOSIS — D649 Anemia, unspecified: Secondary | ICD-10-CM | POA: Diagnosis not present

## 2016-11-29 DIAGNOSIS — G8929 Other chronic pain: Secondary | ICD-10-CM | POA: Insufficient documentation

## 2016-11-29 DIAGNOSIS — N3001 Acute cystitis with hematuria: Secondary | ICD-10-CM | POA: Diagnosis not present

## 2016-11-29 DIAGNOSIS — Z79899 Other long term (current) drug therapy: Secondary | ICD-10-CM | POA: Insufficient documentation

## 2016-11-29 DIAGNOSIS — A599 Trichomoniasis, unspecified: Secondary | ICD-10-CM | POA: Diagnosis not present

## 2016-11-29 DIAGNOSIS — R0789 Other chest pain: Secondary | ICD-10-CM | POA: Insufficient documentation

## 2016-11-29 DIAGNOSIS — N898 Other specified noninflammatory disorders of vagina: Secondary | ICD-10-CM | POA: Diagnosis present

## 2016-11-29 LAB — WET PREP, GENITAL
CLUE CELLS WET PREP: NONE SEEN
Sperm: NONE SEEN
Yeast Wet Prep HPF POC: NONE SEEN

## 2016-11-29 LAB — CBC
HCT: 35.3 % — ABNORMAL LOW (ref 36.0–46.0)
Hemoglobin: 12.5 g/dL (ref 12.0–15.0)
MCH: 29.3 pg (ref 26.0–34.0)
MCHC: 35.4 g/dL (ref 30.0–36.0)
MCV: 82.9 fL (ref 78.0–100.0)
PLATELETS: 299 10*3/uL (ref 150–400)
RBC: 4.26 MIL/uL (ref 3.87–5.11)
RDW: 13.3 % (ref 11.5–15.5)
WBC: 7.6 10*3/uL (ref 4.0–10.5)

## 2016-11-29 LAB — URINALYSIS, ROUTINE W REFLEX MICROSCOPIC
BACTERIA UA: NONE SEEN
Bilirubin Urine: NEGATIVE
Glucose, UA: NEGATIVE mg/dL
KETONES UR: NEGATIVE mg/dL
Nitrite: NEGATIVE
PH: 7 (ref 5.0–8.0)
Protein, ur: NEGATIVE mg/dL
Specific Gravity, Urine: 1.01 (ref 1.005–1.030)

## 2016-11-29 LAB — BASIC METABOLIC PANEL
Anion gap: 6 (ref 5–15)
BUN: 13 mg/dL (ref 6–20)
CALCIUM: 9.8 mg/dL (ref 8.9–10.3)
CO2: 28 mmol/L (ref 22–32)
CREATININE: 0.95 mg/dL (ref 0.44–1.00)
Chloride: 101 mmol/L (ref 101–111)
GFR calc Af Amer: >60 mL/min (ref >60)
Glucose, Bld: 107 mg/dL — ABNORMAL HIGH (ref 65–99)
Potassium: 3.8 mmol/L (ref 3.5–5.1)
SODIUM: 135 mmol/L (ref 135–145)

## 2016-11-29 LAB — POCT I-STAT TROPONIN I: TROPONIN I, POC: 0 ng/mL (ref 0.00–0.08)

## 2016-11-29 LAB — I-STAT BETA HCG BLOOD, ED (NOT ORDERABLE)

## 2016-11-29 MED ORDER — CYCLOBENZAPRINE HCL 10 MG PO TABS: 10.0000 mg | ORAL_TABLET | Freq: Two times a day (BID) | ORAL | 0 refills | Status: DC | PRN

## 2016-11-29 MED ORDER — CEPHALEXIN 500 MG PO CAPS: 500.0000 mg | ORAL_CAPSULE | Freq: Two times a day (BID) | ORAL | 0 refills | Status: DC

## 2016-11-29 MED ORDER — CEFTRIAXONE SODIUM 250 MG IJ SOLR
250.0000 mg | Freq: Once | INTRAMUSCULAR | Status: AC
  Administered 2016-11-29: 250 mg via INTRAMUSCULAR
  Filled 2016-11-29: qty 250

## 2016-11-29 MED ORDER — KETOROLAC TROMETHAMINE 30 MG/ML IJ SOLN
30.0000 mg | Freq: Once | INTRAMUSCULAR | Status: AC
  Administered 2016-11-29: 30 mg via INTRAMUSCULAR
  Filled 2016-11-29: qty 1

## 2016-11-29 MED ORDER — AZITHROMYCIN 250 MG PO TABS
1000.0000 mg | ORAL_TABLET | Freq: Once | ORAL | Status: AC
  Administered 2016-11-29: 1000 mg via ORAL
  Filled 2016-11-29: qty 4

## 2016-11-29 MED ORDER — STERILE WATER FOR INJECTION IJ SOLN
INTRAMUSCULAR | Status: AC
  Administered 2016-11-29: 20:00:00
  Filled 2016-11-29: qty 10

## 2016-11-29 MED ORDER — METRONIDAZOLE 500 MG PO TABS
2000.0000 mg | ORAL_TABLET | Freq: Once | ORAL | Status: AC
  Administered 2016-11-29: 2000 mg via ORAL
  Filled 2016-11-29: qty 4

## 2016-11-29 NOTE — Discharge Instructions (Signed)
1. Medications: usual home medications 2. Treatment: rest, drink plenty of fluids, use a condom with every sexual encounter 3. Follow Up: Please followup with your primary doctor in 3 days for discussion of your diagnoses and further evaluation after today's visit; if you do not have a primary care doctor use the resource guide provided to find one; Please return to the ER for worsening symptoms, high fevers or persistent vomiting.  You have been tested for HIV, syphilis, chlamydia and gonorrhea.  These results will be available in approximately 3 days.  Please inform all sexual partners if you test positive for any of these diseases.   I recommend calling the primary care provider's office listed below to schedule a follow up appointment within the next week for follow-up evaluation and further management of your chronic pain.

## 2016-11-29 NOTE — ED Provider Notes (Signed)
[facility] DEPT Provider Note   CSN: [id number] Arrival date & time: 11/29/16  1402     History   Chief Complaint Chief Complaint  Patient presents with  . Arm Pain  . Chest Pain  . Vaginal Discharge    HPI Kristen [name] is a 36 y.o. female.  HPI   Patient is a 36 year old female with history of sickle cell straight, fibromyalgia, uterine fibroid, GERD, bipolar disorder, anemia who presents to the ED with multiple complaints. Patient reports over the past week she has had gradual onset of constant pain to her right arm that radiates into the right side of her chest. Endorses associated weakness due to pain. Patient reports pain is worse with movement. She states she has had multiple episodes of similar symptoms to her right arm and left leg over the past year that states her primary care provider has been unable to find a cause. Denies any recent fall, trauma or injury. She notes she has been taking her muscle relaxant at home with mild intermittent relief. Denies fever, headache, dizziness, visual changes, neck stiffness, cough, shortness of breath, palpitations, diaphoresis, numbness, facial weakness or slurred speech. Patient states she has been unable to follow up with her PCP due to being unable to pay her $40 co-pay.  She also reports she has been having brown vaginal discharge for the past 1-2 weeks with associated dysuria. She states she has been using over-the-counter vaginal cream without improvement. Denies fever, abdominal pain, vomiting, diarrhea, hematuria, vaginal bleeding. Endorses abdominal surgical history of umbilical hernia repair and total hysterectomy.  Past Medical History:  Diagnosis Date  . Anemia    history  . Anxiety   . Arthritis    lower back, hands, feet  . Bipolar disorder ([place])   . Borderline diabetes    no meds  . Carpal tunnel syndrome on both sides   . Depression   . Fibroid, uterine   . Fibromyalgia   . GERD (gastroesophageal reflux  disease)   . Headache(784.0)    otc meds prn  . Hernia, umbilical   . Hx of bronchitis   . OCD (obsessive compulsive disorder)   . Panic attack   . Scoliosis   . Sickle cell trait ([place])   . Umbilical hernia     Patient Active Problem List   Diagnosis Date Noted  . Concussion with loss of consciousness 07/02/2015  . Cigarette smoker 04/07/2015  . Upper airway cough syndrome 04/07/2015  . Dyspnea 04/07/2015  . Solitary pulmonary nodule 04/06/2015  . Headache 01/26/2015  . Intractable vascular headache 01/26/2015  . Blurry vision, bilateral 01/26/2015  . Eye pain 01/26/2015  . Paresthesias 01/26/2015  . Weakness 01/26/2015  . Endometriosis 11/20/2013  . Pelvic pain in female 09/18/2013  . DEPRESSION 09/07/2010  . BIPOLAR DISORDER UNSPECIFIED 07/13/2009  . ABDOMINAL WALL HERNIA 06/17/2009  . TREMOR 06/17/2009  . ACUTE CYSTITIS 03/08/2009  . SICKLE CELL TRAIT 03/04/2009  . NECK PAIN 03/04/2009  . ABDOMINAL PAIN, GENERALIZED 03/04/2009  . HYPERGLYCEMIA 12/11/2008  . SCOLIOSIS , IDIOPATHIC 11/24/2008  . CHRONIC PAIN SYNDROME 11/09/2008  . INSOMNIA 11/09/2008  . ARTHRALGIA 10/30/2008    Past Surgical History:  Procedure Laterality Date  . ABDOMINAL HYSTERECTOMY N/A 02/10/2014   Procedure: HYSTERECTOMY ABDOMINAL;  Surgeon: Donnamae Jude, MD;  Location: [facility];  Service: Gynecology;  Laterality: N/A;  . BACK SURGERY     2 rods in back -scoliosis  . LAPAROSCOPY N/A 10/29/2013   Procedure: LAPAROSCOPY DIAGNOSTIC;  Surgeon: Donnamae Jude, MD;  Location: [facility];  Service: Gynecology;  Laterality: N/A;  . SALPINGOOPHORECTOMY Bilateral 02/10/2014   Procedure: SALPINGO OOPHORECTOMY;  Surgeon: Donnamae Jude, MD;  Location: [facility];  Service: Gynecology;  Laterality: Bilateral;  . UMBILICAL HERNIA REPAIR N/A 02/10/2014   Procedure: HERNIA REPAIR UMBILICAL ADULT;  Surgeon: Donnamae Jude, MD;  Location: [facility];  Service: Gynecology;  Laterality: N/A;  . WISDOM TOOTH EXTRACTION      OB  History    Gravida Para Term Preterm AB Living   0             SAB TAB Ectopic Multiple Live Births                   Home Medications    Prior to Admission medications   Medication Sig Start Date End Date Taking? Authorizing Provider  albuterol (PROVENTIL HFA;VENTOLIN HFA) 108 (90 BASE) MCG/ACT inhaler Inhale 1 puff into the lungs every 6 (six) hours as needed for wheezing or shortness of breath. 03/29/15  Yes Gareth Morgan, MD  busPIRone (BUSPAR) 10 MG tablet Take 10 mg by mouth 2 (two) times daily.   Yes [provider]  Coenzyme Q10 100 MG TABS Take 1 tablet three times daily 11/14/16  Yes Jaffe, Adam R, DO  cyclobenzaprine (FLEXERIL) 10 MG tablet Take 1 tablet (10 mg total) by mouth 2 (two) times daily as needed for muscle spasms. 10/06/15  Yes Delsa Grana, PA-C  estradiol (ESTRACE) 0.5 MG tablet Take 1 tablet (0.5 mg total) by mouth daily. 02/16/15  Yes Anyanwu, Sallyanne Havers, MD  fexofenadine (ALLEGRA) 180 MG tablet Take 180 mg by mouth daily.   Yes [provider]  HYDROcodone-acetaminophen (NORCO/VICODIN) 5-325 MG tablet Take 1-2 tablets by mouth every 6 hours as needed for pain and/or cough. 05/07/16  Yes Pisciotta, Elmyra Ricks, PA-C  ibuprofen (ADVIL,MOTRIN) 400 MG tablet Take 1 tablet (400 mg total) by mouth every 6 (six) hours as needed. 10/06/15  Yes Delsa Grana, PA-C  levocetirizine (XYZAL) 5 MG tablet Take 5 mg by mouth every evening.   Yes [provider]  LORazepam (ATIVAN) 1 MG tablet Take 1 mg by mouth every 8 (eight) hours.   Yes [provider]  LYRICA 150 MG capsule Take 150 mg by mouth daily. 03/31/15  Yes [provider]  magnesium oxide (MAG-OX) 400 MG tablet Take 1 tablet (400 mg total) by mouth daily. 11/14/16  Yes Jaffe, Adam R, DO  metroNIDAZOLE (METROGEL) 0.75 % vaginal gel Place 1 Applicatorful vaginally at bedtime.   Yes [provider]  mirtazapine (REMERON) 15 MG tablet Take 15 mg by mouth at bedtime.   Yes  [provider]  nortriptyline (PAMELOR) 10 MG capsule Take 1 capsule (10 mg total) by mouth at bedtime. 07/02/15  Yes Tomi Likens, Adam R, DO  omeprazole (PRILOSEC) 40 MG capsule Take 40 mg by mouth daily.   Yes [provider]  ondansetron (ZOFRAN) 8 MG tablet Take by mouth every 8 (eight) hours as needed for nausea or vomiting.   Yes [provider]  pantoprazole (PROTONIX) 40 MG tablet Take 1 tablet by mouth daily. 11/20/16  Yes [provider]  Phenylephrine-APAP-Guaifenesin (TYLENOL SINUS SEVERE) 5-325-200 MG TABS Take 2 tablets by mouth every 4 (four) hours.   Yes [provider]  predniSONE (DELTASONE) 5 MG tablet Take 5 mg by mouth daily with breakfast.   Yes [provider]  Riboflavin 400 MG TABS Take 400  mg by mouth daily. 11/14/16  Yes Jaffe, Adam R, DO  tiZANidine (ZANAFLEX) 2 MG tablet Take 2 mg by mouth 2 (two) times daily as needed for muscle spasms. Reported on 07/02/2015 11/23/14  Yes [provider]  Vitamin D, Ergocalciferol, (DRISDOL) 50000 UNITS CAPS capsule Take 50,000 Units by mouth every 7 (seven) days. 04/09/15  Yes [provider]  cephALEXin (KEFLEX) 500 MG capsule Take 1 capsule (500 mg total) by mouth 2 (two) times daily. 11/29/16   Nona Dell, PA-C  cyclobenzaprine (FLEXERIL) 10 MG tablet Take 1 tablet (10 mg total) by mouth 2 (two) times daily as needed for muscle spasms. 11/29/16   Nona Dell, PA-C    Family History Family History  Problem Relation Age of Onset  . Hypertension Mother   . Diabetes Mother   . Fibromyalgia Mother   . Stroke Mother   . Cancer Maternal Grandmother   . Asthma Cousin     Social History Social History  Substance Use Topics  . Smoking status: Current Every Day Smoker    Packs/day: 0.50    Years: 21.00    Types: Cigarettes  . Smokeless tobacco: Never Used  . Alcohol use 0.0 oz/week     Comment: occasional     Allergies   Tramadol  hcl   Review of Systems Review of Systems  Cardiovascular: Positive for chest pain.  Genitourinary: Positive for dysuria and vaginal discharge.  Musculoskeletal: Positive for myalgias (right arm).  Neurological: Positive for weakness.  All other systems reviewed and are negative.    Physical Exam Updated Vital Signs BP 134/87 (BP Location: Left Arm)   Pulse 63   Temp 98.3 F (36.8 C) (Oral)   Resp 16   Ht 5' 6.5" (1.689 m)   Wt 78.9 kg (174 lb)   LMP 12/31/2012   SpO2 97%   BMI 27.66 kg/m   Physical Exam  Constitutional: She is oriented to person, place, and time. She appears well-developed and well-nourished. No distress.  HENT:  Head: Normocephalic and atraumatic.  Mouth/Throat: Uvula is midline, oropharynx is clear and moist and mucous membranes are normal. No oropharyngeal exudate, posterior oropharyngeal edema, posterior oropharyngeal erythema or tonsillar abscesses. No tonsillar exudate.  Eyes: Conjunctivae and EOM are normal. Right eye exhibits no discharge. Left eye exhibits no discharge. No scleral icterus.  Neck: Normal range of motion. Neck supple.  Cardiovascular: Normal rate, regular rhythm, normal heart sounds and intact distal pulses.   Pulmonary/Chest: Effort normal and breath sounds normal. No respiratory distress. She has no wheezes. She has no rales. She exhibits tenderness. She exhibits no laceration, no crepitus, no edema, no deformity, no swelling and no retraction.  Exquisite TTP over right anterior chest wall with light palpation.   Abdominal: Soft. Bowel sounds are normal. There is no tenderness.  Musculoskeletal: She exhibits no edema.  No midline C, T, or L tenderness. Full range of motion of neck and back. Intermittent TTP over right arm and right upper back with light palpation. Full range of motion of bilateral upper and lower extremities, with 5/5 strength. Patient intermittently refusing to raise her right arm due to reported pain however patient  was noted to use both arms to push herself up in bed and was also initially seen holding both of her arms up in the air while texting on her cell phone. Sensation intact. 2+ radial and PT pulses. Cap refill <2 seconds.   Neurological: She is alert and oriented to person, place,  and time. She has normal strength. She displays normal reflexes. No cranial nerve deficit or sensory deficit.  Skin: Skin is warm and dry. She is not diaphoretic.  Nursing note and vitals reviewed.  Pelvic exam: normal external genitalia, vulva, vagina, cervix, uterus and adnexa, VULVA: normal appearing vulva with no masses, tenderness or lesions, VAGINA: normal appearing vagina with normal color and discharge, no lesions, vaginal discharge - white and mucoid, CERVIX: WET MOUNT done - results: trichomonads, white blood cells, DNA probe for chlamydia and GC obtained, surgically absent, UTERUS: surgically absent, vaginal cuff well healed, ADNEXA: surgically absent bilateral, exam chaperoned by female tech.   ED Treatments / Results  Labs (all labs ordered are listed, but only abnormal results are displayed) Labs Reviewed  WET PREP, GENITAL - Abnormal; Notable for the following:       Result Value   Trich, Wet Prep PRESENT (*)    WBC, Wet Prep HPF POC MANY (*)    All other components within normal limits  BASIC METABOLIC PANEL - Abnormal; Notable for the following:    Glucose, Bld 107 (*)    All other components within normal limits  CBC - Abnormal; Notable for the following:    HCT 35.3 (*)    All other components within normal limits  URINALYSIS, ROUTINE W REFLEX MICROSCOPIC - Abnormal; Notable for the following:    Hgb urine dipstick MODERATE (*)    Leukocytes, UA LARGE (*)    Squamous Epithelial / LPF 0-5 (*)    All other components within normal limits  RPR  HIV ANTIBODY (ROUTINE TESTING)  POCT I-STAT TROPONIN I  I-STAT BETA HCG BLOOD, ED (NOT ORDERABLE)  GC/CHLAMYDIA PROBE AMP ([place]) NOT AT [facility]     EKG  EKG Interpretation None       Radiology Dg Chest 2 View  Result Date: 11/29/2016 CLINICAL DATA:  Central chest pain and right arm pain. EXAM: CHEST  2 VIEW COMPARISON:  10/06/2015 FINDINGS: The heart size and mediastinal contours are within normal limits. Both lungs are clear. Intact bilateral spinal fixation rods with stable mild dextroconvex curvature of the mid thoracic spine. The visualized skeletal structures are unremarkable. IMPRESSION: No active cardiopulmonary disease. Electronically Signed   By: Ashley Royalty M.D.   On: 11/29/2016 15:21    Procedures Procedures (including critical care time)  Medications Ordered in ED Medications  metroNIDAZOLE (FLAGYL) tablet 2,000 mg (2,000 mg Oral Given 11/29/16 1947)  azithromycin (ZITHROMAX) tablet 1,000 mg (1,000 mg Oral Given 11/29/16 1947)  cefTRIAXone (ROCEPHIN) injection 250 mg (250 mg Intramuscular Given 11/29/16 1947)  ketorolac (TORADOL) 30 MG/ML injection 30 mg (30 mg Intramuscular Given 11/29/16 1947)  sterile water (preservative free) injection (  Given 11/29/16 1948)     Initial Impression / Assessment and Plan / ED Course  I have reviewed the triage vital signs and the nursing notes.  Pertinent labs & imaging results that were available during my care of the patient were reviewed by me and considered in my medical decision making (see chart for details).     Patient presents with right upper arm pain that radiates into the right side of her chest which she notes has been present intermittently over the past year. Reports associated weakness to right arm due to pain. Patient reports taking her home prescription of Flexeril with mild intermittent relief but notes she recently ran out and has been unable to follow up with her PCP due to being unable to afford her co-pay.  Denies any recent fall, trauma or injury. VSS. On exam patient with intermittent weakness present to right arm during exam, however patient noted to have  full range of motion of bilateral extremities with 5/5 strength when she as she uses both of her arms and to hold up her cell phone, adjust herself in the bed for the pelvic exam. Patient was also found ambulating in the room without any difficulty. Mild tenderness present to right anterior chest wall without evidence of injury or deformity. Remaining exam unremarkable. No neuro deficits. EKG showed sinus rhythm with no acute ischemic changes. Troponin negative. Labs unremarkable. Chest x-ray negative. Suspect patient's symptoms are consistent with her chronic muscular skeletal pain. Advised patient I will give her a short-term refill of her prescription of Flexeril but advised she needs to follow up with her PCP for further management of her chronic symptoms. Discussed symptomatic treatment and start return precautions.  Patient also presents with vaginal discharge has been present for the past few weeks with associated dysuria. Pelvic exam revealed small amount of white discharge in vaginal vault. Patient with history of total hysterectomy. Wet prep positive for WBCs and trich. Pregnancy negative. UA consistent with UTI. Patient to be discharged with instructions to follow up with OBGYN. Discussed importance of using protection when sexually active. Pt understands that they have GC/Chlamydia cultures pending and that they will need to inform all sexual partners if results return positive. Pt has been treated prophylacticly with azithromycin and rocephin due to pts history, pelvic exam, and wet prep with increased WBCs along with single dose of flagyl for trich. Plan to discharge patient home with prescription of Keflex for UTI.  Final Clinical Impressions(s) / ED Diagnoses   Final diagnoses:  Trichimoniasis  Acute cystitis with hematuria  Other chronic pain    New Prescriptions Discharge Medication List as of 11/29/2016  8:13 PM    START taking these medications   Details  cephALEXin (KEFLEX) 500  MG capsule Take 1 capsule (500 mg total) by mouth 2 (two) times daily., Starting Wed 11/29/2016, Print    !! cyclobenzaprine (FLEXERIL) 10 MG tablet Take 1 tablet (10 mg total) by mouth 2 (two) times daily as needed for muscle spasms., Starting Wed 11/29/2016, Print     !! - Potential duplicate medications found. Please discuss with provider.       Nona Dell, PA-C 11/29/16 2330    Lacretia Leigh, MD 11/30/16 [phone]

## 2016-11-29 NOTE — ED Notes (Signed)
Bed: WA08 Expected date:  Expected time:  Means of arrival:  Comments: 
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
## 2016-11-29 NOTE — ED Notes (Signed)
ED Provider at bedside. 

## 2016-11-30 LAB — RPR: RPR: NONREACTIVE

## 2016-11-30 LAB — HIV ANTIBODY (ROUTINE TESTING W REFLEX): HIV SCREEN 4TH GENERATION: NONREACTIVE

## 2016-12-01 LAB — GC/CHLAMYDIA PROBE AMP (~~LOC~~) NOT AT ARMC
Chlamydia: NEGATIVE
Neisseria Gonorrhea: NEGATIVE

## 2017-05-17 ENCOUNTER — Encounter: Payer: Self-pay | Admitting: Neurology

## 2017-05-17 ENCOUNTER — Ambulatory Visit (INDEPENDENT_AMBULATORY_CARE_PROVIDER_SITE_OTHER): Payer: Medicare Other | Admitting: Neurology

## 2017-05-17 VITALS — BP 108/70 | HR 85 | Ht 66.5 in | Wt 164.8 lb

## 2017-05-17 DIAGNOSIS — F419 Anxiety disorder, unspecified: Secondary | ICD-10-CM | POA: Diagnosis not present

## 2017-05-17 DIAGNOSIS — R51 Headache: Secondary | ICD-10-CM

## 2017-05-17 DIAGNOSIS — F329 Major depressive disorder, single episode, unspecified: Secondary | ICD-10-CM | POA: Diagnosis not present

## 2017-05-17 DIAGNOSIS — F17200 Nicotine dependence, unspecified, uncomplicated: Secondary | ICD-10-CM

## 2017-05-17 DIAGNOSIS — F99 Mental disorder, not otherwise specified: Secondary | ICD-10-CM | POA: Diagnosis not present

## 2017-05-17 DIAGNOSIS — R519 Headache, unspecified: Secondary | ICD-10-CM

## 2017-05-17 NOTE — Patient Instructions (Signed)
1.  We will refer you to psychiatry and psychology 2.  We will refer you for neurocognitive testing 3.  Follow up after testing
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
## 2017-05-22 ENCOUNTER — Ambulatory Visit (INDEPENDENT_AMBULATORY_CARE_PROVIDER_SITE_OTHER): Payer: Medicare Other | Admitting: Psychology

## 2017-05-22 DIAGNOSIS — S060X1S Concussion with loss of consciousness of 30 minutes or less, sequela: Secondary | ICD-10-CM

## 2017-05-22 DIAGNOSIS — F319 Bipolar disorder, unspecified: Secondary | ICD-10-CM

## 2017-05-22 DIAGNOSIS — R413 Other amnesia: Secondary | ICD-10-CM
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
## 2017-05-23 ENCOUNTER — Encounter: Payer: Self-pay | Admitting: Psychology

## 2017-06-26 ENCOUNTER — Encounter: Payer: Self-pay | Admitting: Psychology

## 2017-06-27 ENCOUNTER — Encounter: Payer: Self-pay | Admitting: Psychology

## 2017-06-27 ENCOUNTER — Ambulatory Visit (INDEPENDENT_AMBULATORY_CARE_PROVIDER_SITE_OTHER): Payer: Medicare Other | Admitting: Psychology

## 2017-06-27 DIAGNOSIS — S060X1S Concussion with loss of consciousness of 30 minutes or less, sequela: Secondary | ICD-10-CM

## 2017-06-27 DIAGNOSIS — R413 Other amnesia: Secondary | ICD-10-CM

## 2017-06-27 NOTE — Progress Notes (Signed)
   Neuropsychology Note  Kristen [name] completed 180 minutes of neuropsychological testing with technician, Milana Kidney, BS, under the supervision of Dr. Macarthur Critchley, Licensed Psychologist. The patient appeared lathargic and stated she did not take her a.m. medications. Also patient initially stated she did not feel well and on one occasion stated she felt sick during the testing session, per behavioral observation or via self-report to the technician. Rest breaks were offered. She was very slow in completing the tasks and kept her eyes closed for most of the evaluation. She refused to complete one [name] ([facility] WMT) but otherwise was compliant. She was offered to reschedule her testing for a different date given significant fatigue but she refused.  Communication between the psychologist and technician was ongoing throughout the testing session and changes were made as deemed necessary based on patient performance on testing, technician observations and additional pertinent factors (e.g., current level of functioning, level of presumed impairment, nature of symptoms, emotional and behavioral responses during the status exam/testing, level of literacy, estimated premorbid baseline intellectual abilities, and observed level of motivation/effort).  Kristen [name] will return within 2 weeks for a feedback session with Dr. Si Raider at which time her [name] performances, clinical impressions and treatment recommendations will be reviewed in detail. The patient understands she can contact our office should she require our assistance before this time.  180 minutes spent face-to-face with patient administering standardized tests Environmental education officer). 60 minutes spent scoring Environmental education officer).  Full report to follow.

## 2017-07-11 NOTE — Progress Notes (Signed)
NEUROPSYCHOLOGICAL EVALUATION   Name:    Kristen [name]  Date of Birth:   05-30-1981 Date of Interview:  05/22/2017 Date of Testing:  06/27/2017   Date of Feedback:  07/12/2017       Background Information:  Reason for Referral:  Kristen [name] is a 37 y.o. female referred by Dr. Metta Clines to assess her current level of cognitive functioning and assist in differential diagnosis. The current evaluation consisted of a review of available medical records, an interview with the patient, and the completion of a neuropsychological testing battery. Informed consent was obtained.  History of Presenting Problem:  Kristen [name] has been seen by Dr. Jaynee Eagles of [place] in the past as well as Dr. Tomi Likens who most recently saw her last week on 05/17/2017.   Today (05/22/2017), the patient initially is unable to provide much information, has her eyes closed and is complaining of pain and difficulty expressing herself. As the session goes on, however, she becomes very talkative, almost belligerent, perseverative and tangential with labile mood/affect. At times she is evasive in response to questions asked of her. History is very difficult to attain from the patient today.  She reportedly has a history of multiple concussions but feels that her memory problems started after the one on 03/28/2015. On that date she was reportedly standing on a third floor balcony at a party when the balcony collapsed and she hit the back of her head and her neck. She reported brief loss of consciousness. For several minutes afterwards, she reported amnesia. She was evaluated at [facility] ED where CT of the brain, cervical spine, chest, abdomen and pelvis, as well as Xrays of both knees, and right wrist, ankle and foot. All were unremarkable except incidental pulmonary nodule was seen, which has since been evaluated by pulmonology.  She reported that since the balcony incident on 03/28/2015 she has had significant pain and memory loss.  She reports that when she is talking she has to close her eyes and concentrate and it makes the pain worse. She states she feels "a big blockage" when she tries to speak. She reported it is hard to remember things. She reported it is hard to put words together and "think what to say". She reports ongoing dizziness, headaches/migraines, severe neck pain, irritability, tearfulness.  She reports longstanding history of psychiatric disorders but cannot specify what diagnoses. She does admit she has had auditory hallucinations her whole life. She reported she hears voices and it sometimes gets on her nerves because it feels like "there is a war going on, one telling the other what to do and what not to do". She describes her current mood as "shaky". She endorses anxiety. She endorses sleep difficulties. She reports significant psychosocial stressors including lack of social support and conflict with her father with whom she recently reunited. She reports having lost many friends and family members in recent years, including her ex-boyfriend who died this year. She reports significant financial stress and states she has borrowed money from multiple people in order to pay her bills.  She does not currently see a psychiatrist or mental health provider despite being encouraged to do so by Dr. Tomi Likens. She reports she has "been in therapy and seen psychiatrists my whole life". She reports she had both inpatient and outpatient treatment at [facility] in the past. She reports she recently "signed up online" for services from [facility] and is awaiting a call from them to see if she  can get in.  Upon direct questioning as to whether she was experiencing suicidal thoughts, she replied "Maybe". She stated, "I've been in counseling so much, I know what to say." She later denied any intention of harming herself, stating "I'm safe". She reported that she would not attempt to take her life because she is 'scared  of dying".  She currently lives alone in a rented house. She manages instrumental ADLs independently including driving, medications, finances and appointments.   Social History: Born/Raised: Born in [place], when her parents separated her mother took her and her siblings to [place]. Her mother was a substance abuser. The patient was in several foster homes throughout her childhood. She reported she has "been on my own" since 37 years of age. Education: Unknown  Occupational history: She reports she is on disability. Marital history: Never married, no children.  Alcohol: "Occasional" Tobacco: Smokes cigarettes  SA: Smokes marijuana daily   Medical History:  Past Medical History:  Diagnosis Date  . Anemia    history  . Anxiety   . Arthritis    lower back, hands, feet  . Bipolar disorder ([place])   . Borderline diabetes    no meds  . Carpal tunnel syndrome on both sides   . Depression   . Fibroid, uterine   . Fibromyalgia   . GERD (gastroesophageal reflux disease)   . Headache(784.0)    otc meds prn  . Hernia, umbilical   . Hx of bronchitis   . OCD (obsessive compulsive disorder)   . Panic attack   . Scoliosis   . Sickle cell trait ([place])   . Umbilical hernia     Current medications:  Outpatient Encounter Medications as of 07/12/2017  Medication Sig  . albuterol (PROVENTIL HFA;VENTOLIN HFA) 108 (90 BASE) MCG/ACT inhaler Inhale 1 puff into the lungs every 6 (six) hours as needed for wheezing or shortness of breath.  . busPIRone (BUSPAR) 10 MG tablet Take 10 mg by mouth 2 (two) times daily.  . cephALEXin (KEFLEX) 500 MG capsule Take 1 capsule (500 mg total) by mouth 2 (two) times daily.  . Coenzyme Q10 100 MG TABS Take 1 tablet three times daily  . cyclobenzaprine (FLEXERIL) 10 MG tablet Take 1 tablet (10 mg total) by mouth 2 (two) times daily as needed for muscle spasms.  . cyclobenzaprine (FLEXERIL) 10 MG tablet Take 1 tablet (10 mg total) by mouth 2 (two) times daily as  needed for muscle spasms.  Marland Kitchen estradiol (ESTRACE) 0.5 MG tablet Take 1 tablet (0.5 mg total) by mouth daily.  . fexofenadine (ALLEGRA) 180 MG tablet Take 180 mg by mouth daily.  Marland Kitchen HYDROcodone-acetaminophen (NORCO/VICODIN) 5-325 MG tablet Take 1-2 tablets by mouth every 6 hours as needed for pain and/or cough.  Marland Kitchen ibuprofen (ADVIL,MOTRIN) 400 MG tablet Take 1 tablet (400 mg total) by mouth every 6 (six) hours as needed.  Marland Kitchen levocetirizine (XYZAL) 5 MG tablet Take 5 mg by mouth every evening.  Marland Kitchen LORazepam (ATIVAN) 1 MG tablet Take 1 mg by mouth every 8 (eight) hours.  Marland Kitchen LYRICA 150 MG capsule Take 150 mg by mouth daily.  . magnesium oxide (MAG-OX) 400 MG tablet Take 1 tablet (400 mg total) by mouth daily.  . metroNIDAZOLE (METROGEL) 0.75 % vaginal gel Place 1 Applicatorful vaginally at bedtime.  . mirtazapine (REMERON) 15 MG tablet Take 15 mg by mouth at bedtime.  . nortriptyline (PAMELOR) 10 MG capsule Take 1 capsule (10 mg total) by mouth at bedtime.  Marland Kitchen  omeprazole (PRILOSEC) 40 MG capsule Take 40 mg by mouth daily.  . ondansetron (ZOFRAN) 8 MG tablet Take by mouth every 8 (eight) hours as needed for nausea or vomiting.  . pantoprazole (PROTONIX) 40 MG tablet Take 1 tablet by mouth daily.  Marland Kitchen Phenylephrine-APAP-Guaifenesin (TYLENOL SINUS SEVERE) 5-325-200 MG TABS Take 2 tablets by mouth every 4 (four) hours.  . predniSONE (DELTASONE) 5 MG tablet Take 5 mg by mouth daily with breakfast.  . Riboflavin 400 MG TABS Take 400 mg by mouth daily.  Marland Kitchen tiZANidine (ZANAFLEX) 2 MG tablet Take 2 mg by mouth 2 (two) times daily as needed for muscle spasms. Reported on 07/02/2015  . Vitamin D, Ergocalciferol, (DRISDOL) 50000 UNITS CAPS capsule Take 50,000 Units by mouth every 7 (seven) days.   No facility-administered encounter medications on file as of 07/12/2017.      Current Examination:  Behavioral Observations:  Appearance: Appropriately dressed and groomed. During the test session, she kept her eyes closed  for much of the evaluation and required prompting to answer questions at times. Gait: Ambulated independently, no gross abnormalities observed Speech: initially reported speech problems and was unable to speak fluently but with time her speech became fluent and she was verbose  Thought process: Perseverative, tangential, nonlinear Affect: Initially blunted, then full and labile with tearfulness and yelling at times Interpersonal: Became somewhat belligerent when speaking about her stressors. Was evasive at times.  Orientation: Oriented to all spheres. Accurately named the current President and his predecessor.   Tests Administered: . Test of Premorbid Functioning (TOPF) . Wechsler Adult Intelligence Scale-Fourth Edition (WAIS-IV): Similarities, Matrix Reasoning, Arithmetic, and Digit Span subtests . Repeatable Battery for the Assessment of Neuropsychological Status (RBANS) Form A:   . Controlled Oral Word Association Test (COWAT) . Trail Making Test A and B . Boston Diagnostic Aphasia Examination (BDAE): Commands Subtest . Beck Depression Inventory - Second edition (BDI-II) . Personality Assessment Inventory (PAI) . Green's WMT  Test Results: Note: Standardized scores are presented only for use by appropriately trained professionals and to allow for any future test-retest comparison. These scores should not be interpreted without consideration of all the information that is contained in the rest of the report. The most recent standardization samples from the test publisher or other sources were used whenever possible to derive standard scores; scores were corrected for age, gender, ethnicity and education when available.    Test Scores:  Test Name Raw Score Standardized Score Descriptor  TOPF 22/70 SS= 79 Borderline  WAIS-IV Subtests     Similarities 20/36 ss= 7 Low average  Matrix Reasoning 19/26 ss= 10 Average  Arithmetic 10/22 ss= 7 Low average  Digit Span 30/48 ss= 11 Average    WAIS-IV Index Score     Working Memory  SS= 95 Average  RBANS Index Scores     Immediate Memory  SS= 49 Extremely low  Visual spatial-Constructional  SS= 96 Average  Language  SS= 89 Low average  Attention  SS= 97 Average  Delayed Memory  SS= 56 Extremely low  Total Scale  SS= 71 Borderline  COWAT-FAS 41 T= 50 Average  COWAT-Animals 20 T= 50 Average  Trail Making Test A  31" 0 errors T= 49 Average  Trail Making Test B  71" 0 errors T= 47 Average  BDAE Subtest     Commands 15/15  WNL  BDI-II 36/63  Severe  PAI  Only elevated clinical scales are shown here:   NIM  T= 84   SOM  T= 102   ANX  T= 83   ARD  T= 95   DEP  T= 80   MAN  T= 74   PAR  T= 78   SCZ  T= 85   NON  T= 75      Description of Test Results:  Embedded performance validity indicators were within normal limits, and she demonstrated good effort on a stand-alone test of memory malingering. As such, the patient's current performance on neurocognitive testing is judged to be a relatively accurate representation of her current level of neurocognitive functioning.   Premorbid verbal intellectual abilities were estimated to have been within the borderline to low average range based on a test of word reading. Based on this performance, and her performances on tests of verbal and nonverbal reasoning, it appears possible that she may have a history of verbal learning disorder. However, full psychoeducational evaluation was not under the purview of this assessment.  Psychomotor processing speed ranged from average to borderline impaired. Auditory attention and working memory were average. Visual-spatial construction was high average. Language abilities were intact. Specifically, confrontation naming was average, and semantic verbal fluency was low average to average. Auditory comprehension of multi-step commands was intact. With regard to verbal memory, encoding and acquisition of non-contextual information (i.e., word list)  was impaired. After a delay, free recall was impaired (2/10 items). Performance on a yes/no recognition task was impaired (recognized 6/10 items, no false positives). On another verbal memory test, encoding and acquisition of contextual auditory information (i.e., short story) was impaired. After a delay, free recall was impaired. With regard to non-verbal memory, delayed free recall of visual information was average. Executive functioning was intact. Mental flexibility and set-shifting were average on Trails B. Verbal fluency with phonemic search restrictions was average. Verbal abstract reasoning was low average. Non-verbal abstract reasoning was average.   On a self-report measure of mood, the patient's responses were indicative of clinically significant depression in the severe range. Symptoms endorsed included: sadness, pessimism, feelings of failure, anhedonia, punishment feelings, disappointment in self, tearfulness, restlessness, reduced interest, indecisiveness, feelings of worthlessness, loss of energy, reduced sleep, irritability, reduced appetite, concentration difficulty, fatigue and reduced libido. She endorsed passive suicidal ideation but denied intention or plan.  On a more extensive measure of psychopathology and personality function (PAI), there is no evidence to suggest that the patient was unduly defensive or motivated to portray herself as being relatively free of common shortcomings or minor faults. However, with respect to negative impression management, there are indications that the patient endorsed items that present an unfavorable impression or represent particularly bizarre and unlikely symptoms.  This result raises the possibility of an element of exaggeration of complaints and problems.  Elevations in this range often indicate a "cry for help", or an extremely negative evaluation of oneself and one's life.  Although this pattern does not necessarily indicate a level of distortion  that would render the test results uninterpretable, the interpretive hypotheses presented in this report should be reviewed with this tendency in mind.  Although the results may accurately reflect the client's experience of her problems, the clinical scale elevations may overrepresent the extent and degree of symptomatology as viewed by an objective observer. The PAI clinical profile is marked by significant elevations across several scales, indicating a broad range of clinical features and increasing the possibility of multiple diagnoses.  Given certain response tendencies previously noted, it is possible that the clinical scales may over represent or exaggerate the actual  degree of psychopathology.  Nonetheless, profile patterns of this type are usually associated with marked distress and, unless there is extensive distortion or exaggeration of symptomatology, severe impairment in functioning is typically present.  The patient demonstrates a degree of somatic concerns that is unusual even in clinical samples.  Such a score suggests a ruminative preoccupation with physical functioning and health matters and severe impairment arising from somatic symptoms.  The item endorsement pattern indicates that she reports symptoms consistent with both conversion and somatization disorders.   Also, she is probably seen by others as being something of a perfectionist.  She is likely to be a fairly rigid individual who follows her personal guidelines for conduct in an inflexible and unyielding manner.  She ruminates about matters to the degree that she often has difficulty making decisions and perceiving the larger significance of decisions that are made.  Changes in routine, unexpected events, and contradictory information are likely to generate untoward stress.  She may fear her own impulses and doubt her ability to control them. In addition, and perhaps related to the above problems, the patient has likely experienced a  disturbing traumatic event in the past-an event that continues to distress her and produce recurrent episodes of anxiety.  Whereas the item content of the PAI does not address specific causes of traumatic stress, possible traumatic events involve victimization (e.g., rape, abuse), combat experiences, life-threatening accidents, and natural disasters. A number of aspects of the patient's self-description suggest noteworthy peculiarities in thinking and experience.  It is likely that she experiences unusual perceptual or sensory events (perhaps including full-blown hallucinations) as well as unusual ideas that may include magical thinking or delusional beliefs.  Her thought processes are likely to be marked by confusion, distractibility, and difficulty concentrating, and she may experience her thoughts as blocked, withdrawn, or somehow influenced by others.  She may have some difficulty establishing close interpersonal relationships. The patient indicates that she is experiencing a discomforting level of anxiety and tension.  She is likely to be plagued by worry to the degree that her ability to concentrate and attend are significantly compromised.   The patient reports a number of difficulties consistent with a significant depressive experience.  The quality of the patient's depression seems primarily marked by physiological features, such as a disturbance in sleep pattern, a decrease in level of energy and sexual interest, and a loss of appetite and/or weight.  The patient's self-description indicates significant suspiciousness and hostility in her relations with others.  She is likely to be a hypervigilant individual who often questions and mistrusts the motives of those around her.  She is extremely sensitive in her interactions with others and is quick to feel that she is being treated inequitably and tends to hold grudges against others, even if the perceived affront is unintentional.  Consistent with the  constellation of hypervigilance and suspiciousness, she probably is seen by others as being quite hostile.  Working relationships with others are likely to be very strained, despite any efforts by others to demonstrate support and assistance. The patient describes significant problems frequently associated with aspects of a manic episode.  It appears that, in particular, her clinical picture has elements of grandiosity and irritability; her activity level is not likely to be seen as being remarkably high to most observers.  Content of thought, however, is likely marked by inflated self-esteem or grandiosity that may range from beliefs of having exceptionally high levels of common skills to delusional beliefs of having special  and unique talents that will lead to fame and fortune.   The patient indicates that she is uncertain and indecisive about many major life issues and has little sense of direction or purpose in her life as it currently stands. According to the patient's self-report, she describes NO significant problems in the following areas: antisocial behavior; problems with empathy.  Also, she reports NO significant problems with alcohol or drug abuse or dependence.   With respect to suicidal ideation, the patient does report experiencing periodic and perhaps transient thoughts of self-harm.  She is probably pessimistic and unhappy about her prospects for the future.  Ongoing follow-up regarding the details of her suicidal thoughts and the potential for suicidal behavior is warranted.  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 Cognitive testing was largely within normal limits. Attention, working memory, Engineer, structural, language, visual memory, and executive functioning were all within average or expected ranges. Meanwhile, there was a focal deficit in her ability to learn and remember  new auditory/verbal information. With such a focal deficit, we would typically expect to see a lesion or other abnormality on brain scan, but her head CT was negative. It is also unusual to see such significant and focal deficits from a history of concussion. Therefore etiology of her memory deficit remains unclear. The patient is reporting significant psychological distress characterized by depressed mood and anhedonia with passive suicidal ideation. There is also evidence to suggest bipolar disorder could be considered as a rule-out, as well as PTSD or other anxiety disorder. A somatoform disorder is also considered a possibility based on psychological testing. Further psychiatric evaluation would assist in determining the nature and appropriate treatment of her psychological problems. It is possible that her cognitive complaints are related to or exacerbated by underlying psychiatric disorder(s) and acute emotional distress, but I would have expected more impairment on other aspects of cognitive testing, if that were the sole etiology.  Further neurologic testing (eg MRI, EEG) could be considered to assist with differential diagnosis of cognitive disorder.   Recommendations/Plan: Based on the findings of the present evaluation, the following recommendations are offered:  1. Psychiatry evaluation and mental health treatment is again encouraged. She may even be a good candidate for an intensive outpatient program/day treatment. She was open to this recommendation and did agree to me placing a referral for her to psychiatry and counseling. 2. She will follow up with Dr. Tomi Likens to determine if further neurologic testing is indicated. 3. Compensatory strategies for memory difficulties were provided. Based on her pattern of performances on testing, she will benefit from written/visual reminders (visual memory was intact while auditory memory was impaired).   Feedback to Patient: SCHAE CANDO returned  for a feedback appointment on 07/12/2017 to review the results of her neuropsychological evaluation with this provider. 20 minutes face-to-face time was spent reviewing her test results, my impressions and my recommendations as detailed above.    Total time spent on this patient's case: [id number] unit for interview with psychologist; [phone] and [phone] units of testing by psychometrician under psychologist's supervision; [phone] and [phone] units for integration of patient data, interpretation of standardized test results and clinical data, clinical decision making, treatment planning and preparation of this report, and interactive feedback with review of results to the patient/family by psychologist.      Thank you for your referral of LASHONTA PILLING. Please feel free to contact me if you have  any questions or concerns regarding this report.

## 2017-07-12 ENCOUNTER — Ambulatory Visit (INDEPENDENT_AMBULATORY_CARE_PROVIDER_SITE_OTHER): Payer: Medicare Other | Admitting: Psychology

## 2017-07-12 ENCOUNTER — Encounter: Payer: Self-pay | Admitting: Psychology

## 2017-07-12 ENCOUNTER — Ambulatory Visit: Payer: Self-pay | Admitting: Psychology

## 2017-07-12 DIAGNOSIS — R413 Other amnesia: Secondary | ICD-10-CM

## 2017-07-12 DIAGNOSIS — S060X1S Concussion with loss of consciousness of 30 minutes or less, sequela: Secondary | ICD-10-CM

## 2017-07-12 NOTE — Patient Instructions (Addendum)
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 Cognitive testing was largely within normal limits but there was a focal deficit in her ability to learn and remember new auditory/verbal information. With such a focal deficit, we would expect to have findings on brain scan, but her CT was negative. I also would not expect such significant and focal deficits from her history of concussion. Therefore etiology of this focal memory deficit remains unclear. The patient is reporting significant psychological distress characterized by depressed mood and anhedonia with passive suicidal ideation. There is also evidence to suggest bipolar disorder could be considered as a rule-out, as well as PTSD or other anxiety disorder. A somatoform disorder is also considered a possibility based on psychological testing. It is possible that her cognitive complaints are related to or exacerbated by underlying psychiatric disorder(s) and acute emotional distress, but I would have expected more impairment on other aspects of cognitive testing, if that were the sole etiology. Further testing (eg MRI, EEG) could be considered.  Recommendations/Plan: Based on the findings of the present evaluation, the following recommendations are offered:  1. Psychiatry evaluation and mental health treatment is again encouraged. She may even be a good candidate for an intensive outpatient program/day treatment. 2. Follow up with Dr. Tomi Likens to determine if further neurologic testing is indicated 3. Compensatory strategies for memory difficulties are below  Strategies to enhance cognitive functioning Attention, concentration, memory encoding and consolidation    . Make a plan and be prepared o If you find that you are more attentive at certain times of the day (i.e., the morning), plan important activities and discussions at that  time o Determine which activities take the most time and which are most important, then prioritize your "to do list" based on this information o Break tasks into simpler parts, understand the steps involved before starting o Rehearse the steps mentally or write them down. If you write them down, you can use this as a checklist to check off as you complete them. o Visualize completing the task  . Use external aids  o Write everything down that you do not need to know or work with right now. Don't store extra information in your brain that you don't need right now.  o Use a calendar or planner to make checklists, due dates and "to do" lists. o Use ONLY ONE calendar or planner and look at it frequently o Set alarms for important deadlines or appointments  . Minimize interruptions and distractions  o Find a good work environment, e.g., quiet room with a desk, close curtains, use earplugs, mask sounds with a fan or white noise machine o Turn off cell phone and/or email alerts during important tasks. In fact, it is helpful to schedule a block of time each day where you limit phone and email interruptions and focus on just the more detailed work you have. o Try to minimize the amount of background noise (i.e., television, music) when engaged in important tasks or conversations with others (note that some individuals find soft background music helpful in minimize distraction, so you may need to experiment with optimal level of noise for specific situations)  . Use active effort = consciously attending to details, closely analyzing o Failures of encoding may reflect failure to attend to one's own actions o Be prepared to work more slowly than you usually do  o When reading, allow time for re-reading sections  o Check  your work for errors  . Avoid multitasking o Do not attempt to complete more than one task at one time. Focus on one task until it is completed and then move on to the next one. o Avoid  other activities while engaged in important tasks, such as talking on the phone while balancing the checkbook, making a shopping list during a meeting.   . Use self-talk during tasks o Repeat the steps of the activity to yourself as you complete them o Talk to yourself about your progress o This helps you maintain focus on the task and makes it easier to remember completing the task (Similar to "active effort" above)  . Conserve energy o Conserve energy to avoid fatigue and its effects on cognition - Get enough sleep - Pace yourself  and make sure to take breaks - Be open to receiving help - Exercise for increased energy  . Conversational vigilance = paying attention during a conversation  o Listen actively: focus on the speaker and position yourself so that you can clearly hear the him/her, and have open/relaxed posture  o Eye contact: Maintaining eye contact with the person you are speaking with may increase the likelihood that important information is properly received  o Ask questions: Ask questions for clarification (e.g., request that the speaker explain something in a different way) or ask for information to be repeated if you become distracted, or if you do not hear or understand something during a conversation  o Paraphrase: Summarize or repeat back important information from a conversation in your own words to facilitate communication and ensure that you have heard correctly and understand

## 2017-07-12 NOTE — Progress Notes (Signed)
   Neuropsychology Note  AITANA BURRY completed 40 minutes of neuropsychological testing with technician, Milana Kidney, BS, under the supervision of Dr. Macarthur Critchley, Licensed Psychologist. (This was to complete the Green's WMT which was accidentally omitted/not administered at her previous appointment.) The patient did appear to be in significant pain, per behavioral observation and via self-report to the technician. Rest breaks were provided.   Full report to follow.

## 2017-07-19 ENCOUNTER — Telehealth: Payer: Self-pay

## 2017-07-19 DIAGNOSIS — S060X9A Concussion with loss of consciousness of unspecified duration, initial encounter: Secondary | ICD-10-CM

## 2017-07-19 NOTE — Telephone Encounter (Signed)
-----   Message from Pieter Partridge, DO sent at 07/16/2017  2:25 PM EST ----- [facility] Neuropsychological testing demonstrates cognitive disorder not otherwise specified.  I would like to check MRI of brain without contrast for further evaluation. ----- Message ----- From: Kandis Nab, PsyD Sent: 07/12/2017   2:46 PM To: Pieter Partridge, DO

## 2017-07-24 ENCOUNTER — Other Ambulatory Visit: Payer: Self-pay | Admitting: Neurology

## 2017-08-14 ENCOUNTER — Inpatient Hospital Stay: Admission: RE | Admit: 2017-08-14 | Payer: Self-pay | Source: Ambulatory Visit

## 2017-08-15 ENCOUNTER — Ambulatory Visit
Admission: RE | Admit: 2017-08-15 | Discharge: 2017-08-15 | Disposition: A | Discharge status: Home / Self Care | Payer: Medicare Other | Source: Ambulatory Visit | Attending: Neurology | Admitting: Neurology

## 2017-08-15 DIAGNOSIS — S060X9A Concussion with loss of consciousness of unspecified duration, initial encounter: Secondary | ICD-10-CM

## 2017-08-16 ENCOUNTER — Telehealth: Payer: Self-pay | Admitting: *Deleted

## 2017-08-16 NOTE — Telephone Encounter (Signed)
Patient notified

## 2017-08-16 NOTE — Telephone Encounter (Signed)
-----   Message from Pieter Partridge, DO sent at 08/16/2017  7:54 AM EST ----- MRI is normal

## 2017-08-28 ENCOUNTER — Ambulatory Visit: Payer: Self-pay | Admitting: Neurology

## 2017-09-18 ENCOUNTER — Ambulatory Visit (INDEPENDENT_AMBULATORY_CARE_PROVIDER_SITE_OTHER): Payer: Medicare Other | Admitting: Neurology

## 2017-09-18 ENCOUNTER — Telehealth: Payer: Self-pay

## 2017-09-18 ENCOUNTER — Encounter: Payer: Self-pay | Admitting: Neurology

## 2017-09-18 VITALS — BP 112/62 | HR 79 | Resp 16 | Ht 66.5 in | Wt 168.5 lb

## 2017-09-18 DIAGNOSIS — R519 Headache, unspecified: Secondary | ICD-10-CM

## 2017-09-18 DIAGNOSIS — R42 Dizziness and giddiness: Secondary | ICD-10-CM | POA: Diagnosis not present

## 2017-09-18 DIAGNOSIS — R51 Headache: Secondary | ICD-10-CM | POA: Diagnosis not present

## 2017-09-18 DIAGNOSIS — F329 Major depressive disorder, single episode, unspecified: Secondary | ICD-10-CM

## 2017-09-18 DIAGNOSIS — F419 Anxiety disorder, unspecified: Secondary | ICD-10-CM | POA: Diagnosis not present

## 2017-09-18 MED ORDER — NORTRIPTYLINE HCL 25 MG PO CAPS: 25.0000 mg | ORAL_CAPSULE | Freq: Every day | ORAL | 3 refills | Status: DC

## 2017-09-18 NOTE — Patient Instructions (Addendum)
1.  Start nortriptyline 25mg  at bedtime for headaches.  Contact me in 4 weeks and we can increase dose if needed. 2.  You must establish care with a psychiatrist- [facility] [phone] 3.  Follow up in 4 months.

## 2017-09-18 NOTE — Telephone Encounter (Signed)
Patient would like a letter from Dr. Tomi [name] stating about her medical condition and her OV she had with him to give to her lawyer. She states it is okay to mail letter to her as she needs this letter ASAP. Please advise

## 2017-09-18 NOTE — Telephone Encounter (Signed)
She can request/sign release for my office notes but I will not prepare a letter.

## 2017-09-18 NOTE — Telephone Encounter (Signed)
Patient was advised.  
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
## 2017-09-24 ENCOUNTER — Telehealth: Payer: Self-pay | Admitting: Neurology

## 2017-09-24 ENCOUNTER — Other Ambulatory Visit: Payer: Self-pay | Admitting: Neurology

## 2017-09-24 MED ORDER — MAGNESIUM OXIDE 400 MG PO TABS: 400.0000 mg | ORAL_TABLET | Freq: Every day | ORAL | 3 refills | Status: DC

## 2017-09-24 NOTE — Telephone Encounter (Signed)
Pharmacist called needing to get a refill on Magnesium Oxide. The pharmacy name has now changed to [facility]. She said she is needing it as soon as possible.  Thanks

## 2017-09-27 ENCOUNTER — Telehealth: Payer: Self-pay | Admitting: Neurology

## 2017-09-27 MED ORDER — MAGNESIUM 400 MG PO TABS: 400.0000 mg | ORAL_TABLET | Freq: Every day | ORAL | 6 refills | Status: AC

## 2017-09-27 NOTE — Telephone Encounter (Signed)
Called and spoke with Pt. Advsd her I called Recruitment consultant, I talked with [place]. The pharmacist attempted to reach the Pt on 09/24/09, they LMOVM and an automated call was placed to her today to call the pharmacy. Pt said she shouldn't have to call them. She wants me to call Magnesium in to [place].

## 2017-09-27 NOTE — Telephone Encounter (Signed)
*  STAT* If patient is at the pharmacy, call can be transferred to refill team.  1.     Which medications need to be refilled? (please list name of each medication and dose if know) magnesium   2.     Which pharmacy/location (including street and city if local pharmacy) is medication to be sent to? [facility]   3.     Do they need a 30 or 90 day supply?30    Patient states that she has not gotten her medication from the other pharmacy and really needs the medication ASAP.    She also states that she has not heard anything from the PT place please call patient about both.  Patient left message about her medication on 09-24-17 and never heard anything

## 2017-10-15 ENCOUNTER — Encounter: Payer: Self-pay | Admitting: Neurology

## 2017-10-18 ENCOUNTER — Other Ambulatory Visit: Payer: Self-pay | Admitting: Neurology

## 2017-10-23 ENCOUNTER — Other Ambulatory Visit: Payer: Self-pay | Admitting: Neurology

## 2017-10-23 ENCOUNTER — Telehealth: Payer: Self-pay | Admitting: Neurology

## 2017-10-23 NOTE — Telephone Encounter (Signed)
Called Pt, gave her the number to [facility] [phone]

## 2017-10-23 NOTE — Telephone Encounter (Signed)
Patient called and needed to check the status of her Referral. Please Call. Thanks

## 2017-10-23 NOTE — Telephone Encounter (Signed)
Patient called back to check the Referral Status. Please Call. Thanks

## 2017-12-06 ENCOUNTER — Encounter

## 2017-12-06 ENCOUNTER — Ambulatory Visit: Payer: Self-pay | Admitting: Neurology

## 2018-01-18 ENCOUNTER — Encounter: Payer: Self-pay | Admitting: Neurology

## 2018-01-18 ENCOUNTER — Ambulatory Visit (INDEPENDENT_AMBULATORY_CARE_PROVIDER_SITE_OTHER): Payer: Medicare HMO | Admitting: Neurology

## 2018-01-18 VITALS — BP 102/72 | HR 72 | Ht 66.5 in | Wt 171.0 lb

## 2018-01-18 DIAGNOSIS — G44229 Chronic tension-type headache, not intractable: Secondary | ICD-10-CM | POA: Diagnosis not present

## 2018-01-18 MED ORDER — MAGNESIUM OXIDE 400 MG PO TABS: 1.0000 | ORAL_TABLET | Freq: Every day | ORAL | 3 refills | Status: DC

## 2018-01-18 MED ORDER — RIBOFLAVIN 400 MG PO TABS: 400.0000 mg | ORAL_TABLET | Freq: Every day | ORAL | 3 refills | Status: AC

## 2018-01-18 MED ORDER — COENZYME Q10 100 MG PO TABS: ORAL_TABLET | ORAL | 3 refills | Status: DC

## 2018-01-18 NOTE — Patient Instructions (Signed)
1.  Continue nortriptyline 25mg  at bedtime, magnesium 400mg  daily, riboflavin 400mg  daily and coenzyme Q10 100mg  three times daily 2.  Limit pain relievers to no more than 2 days out of week to prevent rebound headache 3.  Keep headache diary 4.  Follow up in 5 months
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
## 2018-02-12 ENCOUNTER — Ambulatory Visit (INDEPENDENT_AMBULATORY_CARE_PROVIDER_SITE_OTHER): Payer: Medicare HMO | Admitting: Cardiovascular Disease

## 2018-02-12 ENCOUNTER — Encounter: Payer: Self-pay | Admitting: Cardiovascular Disease

## 2018-02-12 VITALS — BP 108/70 | HR 66 | Ht 66.5 in | Wt 170.2 lb

## 2018-02-12 DIAGNOSIS — M79604 Pain in right leg: Secondary | ICD-10-CM

## 2018-02-12 DIAGNOSIS — Z720 Tobacco use: Secondary | ICD-10-CM | POA: Insufficient documentation

## 2018-02-12 DIAGNOSIS — M79605 Pain in left leg: Secondary | ICD-10-CM | POA: Diagnosis not present

## 2018-02-12 NOTE — Assessment & Plan Note (Signed)
To ongoing tobacco abuse of a pack a day since age 37 recalcitrant risk factor modification.

## 2018-02-12 NOTE — Patient Instructions (Signed)
Medication Instructions:   NO CHANGE  Testing/Procedures:  Your physician has requested that you have an ankle brachial index (ABI). During this test an ultrasound and blood pressure cuff are used to evaluate the arteries that supply the arms and legs with blood. Allow thirty minutes for this exam. There are no restrictions or special instructions.    Follow-Up:  Your physician recommends that you schedule a follow-up appointment in: AS NEEDED PENDING TEST RESULTS
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
## 2018-02-12 NOTE — Assessment & Plan Note (Signed)
Kristen [name] was self-referred for bilateral leg pain.  The pain occurs all the time, whether sitting lying standing or walking.  She has intact pedal pulses.  Her symptoms sound neurologic.  I am going to get ankle-brachial indices to rule out a vascular cause.

## 2018-02-25 ENCOUNTER — Ambulatory Visit (HOSPITAL_COMMUNITY): Payer: Medicare HMO

## 2018-03-04 ENCOUNTER — Encounter (HOSPITAL_COMMUNITY): Payer: Self-pay | Admitting: Emergency Medicine

## 2018-03-04 ENCOUNTER — Emergency Department (HOSPITAL_COMMUNITY)
Admission: EM | Admit: 2018-03-04 | Discharge: 2018-03-05 | Disposition: A | Discharge status: Home / Self Care | Payer: Medicare HMO | Attending: Emergency Medicine | Admitting: Emergency Medicine

## 2018-03-04 ENCOUNTER — Other Ambulatory Visit: Payer: Self-pay

## 2018-03-04 DIAGNOSIS — F17210 Nicotine dependence, cigarettes, uncomplicated: Secondary | ICD-10-CM | POA: Diagnosis not present

## 2018-03-04 DIAGNOSIS — Z79899 Other long term (current) drug therapy: Secondary | ICD-10-CM | POA: Insufficient documentation

## 2018-03-04 DIAGNOSIS — R21 Rash and other nonspecific skin eruption: Secondary | ICD-10-CM | POA: Insufficient documentation

## 2018-03-04 MED ORDER — NYSTATIN-TRIAMCINOLONE 100000-0.1 UNIT/GM-% EX CREA: TOPICAL_CREAM | CUTANEOUS | 0 refills | Status: AC

## 2018-03-04 NOTE — ED Triage Notes (Signed)
Pt reports having rash to body that started over a week ago and has not improved.

## 2018-03-04 NOTE — ED Provider Notes (Signed)
[facility] DEPT Provider Note   CSN: [id number] Arrival date & time: 03/04/18  2019     History   Chief Complaint Chief Complaint  Patient presents with  . Rash    HPI Kristen [name] is a 37 y.o. female.  Patient to ED for evaluation of rash that started on bilateral breasts one week ago and has spread in generalized distribution throughout the week. She describes itching and denies pain. The rash is a dry rash without blisters, drainage or bleeding. No history of same. No recent exposures.   The history is provided by the patient. No language interpreter was used.  Rash      Past Medical History:  Diagnosis Date  . Anemia    history  . Anxiety   . Arthritis    lower back, hands, feet  . Bipolar disorder ([place])   . Borderline diabetes    no meds  . Carpal tunnel syndrome on both sides   . Depression   . Fibroid, uterine   . Fibromyalgia   . GERD (gastroesophageal reflux disease)   . Headache(784.0)    otc meds prn  . Hernia, umbilical   . Hx of bronchitis   . OCD (obsessive compulsive disorder)   . Panic attack   . Scoliosis   . Sickle cell trait ([place])   . Umbilical hernia     Patient Active Problem List   Diagnosis Date Noted  . Bilateral leg pain 02/12/2018  . Tobacco abuse 02/12/2018  . Concussion with loss of consciousness 07/02/2015  . Cigarette smoker 04/07/2015  . Upper airway cough syndrome 04/07/2015  . Dyspnea 04/07/2015  . Solitary pulmonary nodule 04/06/2015  . Headache 01/26/2015  . Intractable vascular headache 01/26/2015  . Blurry vision, bilateral 01/26/2015  . Eye pain 01/26/2015  . Paresthesias 01/26/2015  . Weakness 01/26/2015  . Endometriosis 11/20/2013  . Pelvic pain in female 09/18/2013  . DEPRESSION 09/07/2010  . BIPOLAR DISORDER UNSPECIFIED 07/13/2009  . ABDOMINAL WALL HERNIA 06/17/2009  . TREMOR 06/17/2009  . ACUTE CYSTITIS 03/08/2009  . SICKLE CELL TRAIT 03/04/2009  . NECK PAIN  03/04/2009  . ABDOMINAL PAIN, GENERALIZED 03/04/2009  . HYPERGLYCEMIA 12/11/2008  . SCOLIOSIS , IDIOPATHIC 11/24/2008  . CHRONIC PAIN SYNDROME 11/09/2008  . INSOMNIA 11/09/2008  . ARTHRALGIA 10/30/2008    Past Surgical History:  Procedure Laterality Date  . ABDOMINAL HYSTERECTOMY N/A 02/10/2014   Procedure: HYSTERECTOMY ABDOMINAL;  Surgeon: Donnamae Jude, MD;  Location: [facility];  Service: Gynecology;  Laterality: N/A;  . BACK SURGERY     2 rods in back -scoliosis  . LAPAROSCOPY N/A 10/29/2013   Procedure: LAPAROSCOPY DIAGNOSTIC;  Surgeon: Donnamae Jude, MD;  Location: [facility];  Service: Gynecology;  Laterality: N/A;  . SALPINGOOPHORECTOMY Bilateral 02/10/2014   Procedure: SALPINGO OOPHORECTOMY;  Surgeon: Donnamae Jude, MD;  Location: [facility];  Service: Gynecology;  Laterality: Bilateral;  . UMBILICAL HERNIA REPAIR N/A 02/10/2014   Procedure: HERNIA REPAIR UMBILICAL ADULT;  Surgeon: Donnamae Jude, MD;  Location: [facility];  Service: Gynecology;  Laterality: N/A;  . WISDOM TOOTH EXTRACTION       OB History    Gravida  0   Para      Term      Preterm      AB      Living        SAB      TAB      Ectopic  Multiple      Live Births               Home Medications    Prior to Admission medications   Medication Sig Start Date End Date Taking? Authorizing Provider  albuterol (PROVENTIL HFA;VENTOLIN HFA) 108 (90 BASE) MCG/ACT inhaler Inhale 1 puff into the lungs every 6 (six) hours as needed for wheezing or shortness of breath. 03/29/15   Gareth Morgan, MD  busPIRone (BUSPAR) 10 MG tablet Take 10 mg by mouth 2 (two) times daily.    [provider]  Coenzyme Q10 100 MG TABS Take 1 tablet three times daily 01/18/18   Pieter Partridge, DO  cyclobenzaprine (FLEXERIL) 10 MG tablet Take 1 tablet (10 mg total) by mouth 2 (two) times daily as needed for muscle spasms. 10/06/15   Delsa Grana, PA-C  estradiol (ESTRACE) 0.5 MG tablet Take 1 tablet (0.5 mg total) by mouth  daily. 02/16/15   Anyanwu, Sallyanne Havers, MD  fexofenadine (ALLEGRA) 180 MG tablet Take 180 mg by mouth daily.    [provider]  HYDROcodone-acetaminophen (NORCO/VICODIN) 5-325 MG tablet Take 1-2 tablets by mouth every 6 hours as needed for pain and/or cough. 05/07/16   Pisciotta, Elmyra Ricks, PA-C  ibuprofen (ADVIL,MOTRIN) 400 MG tablet Take 1 tablet (400 mg total) by mouth every 6 (six) hours as needed. 10/06/15   Delsa Grana, PA-C  levocetirizine (XYZAL) 5 MG tablet Take 5 mg by mouth every evening.    [provider]  LORazepam (ATIVAN) 1 MG tablet Take 1 mg by mouth every 8 (eight) hours.    [provider]  LYRICA 150 MG capsule Take 150 mg by mouth daily. 03/31/15   [provider]  Magnesium 400 MG TABS Take 400 mg by mouth daily at 12 noon. 09/27/17   Pieter Partridge, DO  metroNIDAZOLE (METROGEL) 0.75 % vaginal gel Place 1 Applicatorful vaginally at bedtime.    [provider]  mirtazapine (REMERON) 15 MG tablet Take 15 mg by mouth at bedtime.    [provider]  nortriptyline (PAMELOR) 25 MG capsule TAKE 1 CAPSULE BY MOUTH EVERY NIGHT AT BEDTIME 10/24/17   Tomi Likens, Adam R, DO  omeprazole (PRILOSEC) 40 MG capsule Take 40 mg by mouth daily.    [provider]  ondansetron (ZOFRAN) 8 MG tablet Take by mouth every 8 (eight) hours as needed for nausea or vomiting.    [provider]  pantoprazole (PROTONIX) 40 MG tablet Take 1 tablet by mouth daily. 11/20/16   [provider]  Phenylephrine-APAP-Guaifenesin (TYLENOL SINUS SEVERE) 5-325-200 MG TABS Take 2 tablets by mouth every 4 (four) hours.    [provider]  predniSONE (DELTASONE) 5 MG tablet Take 5 mg by mouth daily with breakfast.    [provider]  Riboflavin 400 MG TABS Take 400 mg by mouth daily. 01/18/18   Pieter Partridge, DO  tiZANidine (ZANAFLEX) 2 MG tablet Take 2 mg by mouth 2 (two) times daily as needed for muscle spasms. Reported on 07/02/2015 11/23/14    [provider]  Vitamin D, Ergocalciferol, (DRISDOL) 50000 UNITS CAPS capsule Take 50,000 Units by mouth every 7 (seven) days. 04/09/15   [provider]    Family History Family History  Problem Relation Age of Onset  . Hypertension Mother   . Diabetes Mother   . Fibromyalgia Mother   . Stroke Mother   . Cancer Maternal Grandmother   . Asthma Cousin     Social  History Social History   Tobacco Use  . Smoking status: Current Every Day Smoker    Packs/day: 0.50    Years: 21.00    Pack years: 10.50    Types: Cigarettes  . Smokeless tobacco: Never Used  Substance Use Topics  . Alcohol use: Yes    Alcohol/week: 0.0 standard drinks    Comment: occasional  . Drug use: Yes    Types: Marijuana    Comment: Daily     Allergies   Chocolate and Tramadol hcl   Review of Systems Review of Systems  Constitutional: Negative for fever.  Musculoskeletal: Negative for myalgias.  Skin: Positive for rash.     Physical Exam Updated Vital Signs BP 113/81 (BP Location: Right Arm)   Pulse 70   Temp 98.3 F (36.8 C) (Oral)   Resp 16   Ht 5\' 6"  (1.676 m)   Wt 77.6 kg   LMP 12/31/2012   SpO2 99%   BMI 27.60 kg/m   Physical Exam  Constitutional: She appears well-developed and well-nourished. No distress.  Skin:  Small, singular, raised areas all less than 1 cm in generalized distribution. There is some hyperpigmentation and scaling c/w fungal etiology.      ED Treatments / Results  Labs (all labs ordered are listed, but only abnormal results are displayed) Labs Reviewed - No data to display  EKG None  Radiology No results found.  Procedures Procedures (including critical care time)  Medications Ordered in ED Medications - No data to display   Initial Impression / Assessment and Plan / ED Course  I have reviewed the triage vital signs and the nursing notes.  Pertinent labs & imaging results that were available during my care of the patient  were reviewed by me and considered in my medical decision making (see chart for details).     1. Nonspecific rash  Will provide Mycolog for daily use. Refer to dermatology if symptoms persist.   Final Clinical Impressions(s) / ED Diagnoses   Final diagnoses:  None    ED Discharge Orders    None       Dennie Bible 03/04/18 [place], [place], DO 03/05/18 0216

## 2018-03-04 NOTE — Discharge Instructions (Addendum)
Use the topical cream ONCE daily (not twice). If rash persists, follow up with your choice of dermatologist.

## 2018-03-11 ENCOUNTER — Other Ambulatory Visit: Payer: Self-pay | Admitting: Cardiovascular Disease

## 2018-03-11 ENCOUNTER — Ambulatory Visit (HOSPITAL_COMMUNITY)
Admission: RE | Admit: 2018-03-11 | Discharge: 2018-03-11 | Disposition: A | Discharge status: Home / Self Care | Payer: Medicare HMO | Source: Ambulatory Visit | Attending: Cardiovascular Disease | Admitting: Cardiovascular Disease

## 2018-03-11 DIAGNOSIS — R200 Anesthesia of skin: Secondary | ICD-10-CM

## 2018-03-11 DIAGNOSIS — M79604 Pain in right leg: Secondary | ICD-10-CM | POA: Diagnosis not present

## 2018-03-11 DIAGNOSIS — R202 Paresthesia of skin: Secondary | ICD-10-CM

## 2018-03-11 DIAGNOSIS — M79605 Pain in left leg: Secondary | ICD-10-CM | POA: Diagnosis not present

## 2018-03-22 ENCOUNTER — Other Ambulatory Visit: Payer: Self-pay | Admitting: Neurology

## 2018-04-24 ENCOUNTER — Other Ambulatory Visit: Payer: Self-pay | Admitting: Neurology

## 2018-09-26 ENCOUNTER — Telehealth: Payer: Self-pay | Admitting: Neurology

## 2018-09-26 NOTE — Telephone Encounter (Signed)
Called and LMOVM for Pt to return my call 

## 2018-09-26 NOTE — Telephone Encounter (Signed)
Pt called back, provided her with number to family services. She made an appt (virtual) for Monday.

## 2018-09-26 NOTE — Telephone Encounter (Signed)
Patient called regarding her not doing any better. She said her memory is not good at all. She said it hurts to think. Her Eyes and Head hurts when trying to think. She is having a hard time finding words. She is not sure what to do? She is also needing then number of the Counselor as well as the name that was given to her from our office. Please Call. Thanks
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
## 2018-09-30 ENCOUNTER — Telehealth (INDEPENDENT_AMBULATORY_CARE_PROVIDER_SITE_OTHER): Payer: Medicare HMO | Admitting: Neurology

## 2018-09-30 ENCOUNTER — Encounter: Payer: Self-pay | Admitting: Neurology

## 2018-09-30 ENCOUNTER — Other Ambulatory Visit: Payer: Self-pay

## 2018-09-30 DIAGNOSIS — F419 Anxiety disorder, unspecified: Secondary | ICD-10-CM

## 2018-09-30 DIAGNOSIS — R42 Dizziness and giddiness: Secondary | ICD-10-CM

## 2018-09-30 DIAGNOSIS — G44229 Chronic tension-type headache, not intractable: Secondary | ICD-10-CM | POA: Diagnosis not present

## 2018-09-30 DIAGNOSIS — G894 Chronic pain syndrome: Secondary | ICD-10-CM

## 2018-09-30 DIAGNOSIS — F329 Major depressive disorder, single episode, unspecified: Secondary | ICD-10-CM

## 2018-10-07 ENCOUNTER — Other Ambulatory Visit: Payer: Self-pay | Admitting: *Deleted

## 2018-10-07 DIAGNOSIS — Z1231 Encounter for screening mammogram for malignant neoplasm of breast: Secondary | ICD-10-CM

## 2018-10-29 ENCOUNTER — Other Ambulatory Visit: Payer: Self-pay | Admitting: Neurology

## 2019-06-09 ENCOUNTER — Other Ambulatory Visit: Payer: Self-pay

## 2019-06-09 ENCOUNTER — Telehealth: Payer: Self-pay | Admitting: Neurology

## 2019-06-09 MED ORDER — MAGNESIUM OXIDE 400 (241.3 MG) MG PO TABS: ORAL_TABLET | ORAL | 6 refills | Status: AC

## 2019-06-09 NOTE — Telephone Encounter (Signed)
The following message was left with [facility] on 06/09/19 at 12:56 PM.  Caller states she needs a refill on her medication Mag-Oxide 400 MG and her heart pills as well. Caller states to send them to [facility].   Secondary number: [phone].  I attempted to call this patient back to get more information on the second medication needed and the specific pharmacy but was not able to reach the patient or leave a message.

## 2019-06-09 NOTE — Telephone Encounter (Signed)
Patient called back needing to get a refill on her heart medication as well as another medication. She uses [facility] on [address]. Thank you

## 2019-06-09 NOTE — Telephone Encounter (Signed)
I only sent magoxide, the heart pill should come from her PCP

## 2019-06-12 ENCOUNTER — Other Ambulatory Visit: Payer: Self-pay | Admitting: Neurology

## 2019-06-16 ENCOUNTER — Other Ambulatory Visit: Payer: Self-pay

## 2019-06-16 MED ORDER — CO Q 10 100 MG PO CAPS: ORAL_CAPSULE | ORAL | 1 refills | Status: AC

## 2020-06-01 ENCOUNTER — Ambulatory Visit: Payer: Medicare HMO | Admitting: Physician Assistant

## 2020-06-27 ENCOUNTER — Other Ambulatory Visit: Payer: Self-pay | Admitting: Neurology

## 2020-06-28 ENCOUNTER — Telehealth: Payer: Self-pay | Admitting: Neurology

## 2020-06-28 NOTE — Telephone Encounter (Signed)
Pt advised we can not send scripts past 1 year out and the pt was last seen 09/2018.

## 2020-06-28 NOTE — Telephone Encounter (Signed)
I haven't seen her in over 1 1/2 years.  I cannot refill her medications until she is seen.  She should be put on the waitlist.  In the meantime, she may ask her PCP to refill medications until she is able to be seen

## 2020-06-28 NOTE — Telephone Encounter (Signed)
Tried calling pt, Pt upset she thought her appt was for 1/13 and not 7/13.   Carmell Austria please advised. I advise pt she has to be seen before she can get her refills.

## 2020-06-28 NOTE — Telephone Encounter (Signed)
Patient called in to get a refill on her magnesium-oxide sent to a new pharmacy. She needs it to go to [facility] on [address].
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
## 2020-07-19 ENCOUNTER — Encounter: Payer: Self-pay | Admitting: Neurology

## 2020-07-19 ENCOUNTER — Ambulatory Visit: Payer: Medicare HMO | Admitting: Neurology

## 2020-07-19 DIAGNOSIS — Z029 Encounter for administrative examinations, unspecified: Secondary | ICD-10-CM

## 2020-12-09 ENCOUNTER — Other Ambulatory Visit: Payer: Self-pay | Admitting: Neurology

## 2020-12-29 ENCOUNTER — Ambulatory Visit: Payer: Medicare HMO | Admitting: Neurology

## 2021-02-22 ENCOUNTER — Other Ambulatory Visit: Payer: Self-pay | Admitting: Neurology

## 2021-02-25 ENCOUNTER — Telehealth: Payer: Self-pay | Admitting: Neurology

## 2021-02-25 NOTE — Telephone Encounter (Signed)
Pt called in, she needs a refill on her magnesium oxide. Stated she has tried to go without it, but was unsuccessful. She said she needs help finding a PCP, due to negligence at her current one.

## 2021-02-25 NOTE — Telephone Encounter (Signed)
Called patient and informed her that when we last spoke to her in 06/28/2020 she was advised that Dr. Tomi Likens hasn't seen her in over 1 1/2 years and that Dr. Tomi Likens cannot refill her medications until she is seen. Also, patient was informed that could be put on the waitlist. Patient was advised at that time to contact her PCP for refills.  Patient became agitated and stated that she has our number stored in her phone and has been on the medication Magnesium Oxide. I informed patient that Dr. Tomi Likens cannot refill her medication because she has not been seen for 2 years now. However, I informed patient that Dr. Tomi Likens told me she could actually purchase this medication over the counter. Patient then stated "Can you please transfer me to the front so I can make an appointment." Before transferring patient I advised her that Dr. Tomi Likens is booked out for 6 or more months and that she can be added to the wait list.   I was unable to inform patient that she would need to contact her insurance to help assist her to find a PCP due to patient wanting to be transferred.

## 2021-08-25 ENCOUNTER — Ambulatory Visit: Payer: Medicare HMO | Admitting: Internal Medicine

## 2022-09-22 ENCOUNTER — Other Ambulatory Visit: Payer: Self-pay

## 2022-09-22 ENCOUNTER — Emergency Department (HOSPITAL_COMMUNITY)
Admission: EM | Admit: 2022-09-22 | Discharge: 2022-09-22 | Disposition: A | Discharge status: Home / Self Care | Payer: 59 | Attending: Emergency Medicine | Admitting: Emergency Medicine

## 2022-09-22 ENCOUNTER — Emergency Department (HOSPITAL_COMMUNITY): Payer: 59

## 2022-09-22 ENCOUNTER — Encounter (HOSPITAL_COMMUNITY): Payer: Self-pay

## 2022-09-22 DIAGNOSIS — G8929 Other chronic pain: Secondary | ICD-10-CM

## 2022-09-22 DIAGNOSIS — R531 Weakness: Secondary | ICD-10-CM

## 2022-09-22 DIAGNOSIS — M5442 Lumbago with sciatica, left side: Secondary | ICD-10-CM | POA: Diagnosis not present

## 2022-09-22 DIAGNOSIS — R0789 Other chest pain: Secondary | ICD-10-CM | POA: Insufficient documentation

## 2022-09-22 LAB — TROPONIN I (HIGH SENSITIVITY): Troponin I (High Sensitivity): <2 ng/L (ref <18)

## 2022-09-22 LAB — COMPREHENSIVE METABOLIC PANEL
ALT: 15 U/L (ref 0–44)
AST: 17 U/L (ref 15–41)
Albumin: 4.3 g/dL (ref 3.5–5.0)
Alkaline Phosphatase: 44 U/L (ref 38–126)
Anion gap: 7 (ref 5–15)
BUN: 15 mg/dL (ref 6–20)
CO2: 26 mmol/L (ref 22–32)
Calcium: 9.2 mg/dL (ref 8.9–10.3)
Chloride: 104 mmol/L (ref 98–111)
Creatinine, Ser: 1.03 mg/dL — ABNORMAL HIGH (ref 0.44–1.00)
GFR, Estimated: >60 mL/min (ref >60)
Glucose, Bld: 101 mg/dL — ABNORMAL HIGH (ref 70–99)
Potassium: 3.5 mmol/L (ref 3.5–5.1)
Sodium: 137 mmol/L (ref 135–145)
Total Bilirubin: 0.6 mg/dL (ref 0.3–1.2)
Total Protein: 7.5 g/dL (ref 6.5–8.1)

## 2022-09-22 LAB — MAGNESIUM: Magnesium: 1.9 mg/dL (ref 1.7–2.4)

## 2022-09-22 LAB — HCG, QUANTITATIVE, PREGNANCY: hCG, Beta Chain, Quant, S: 1 m[IU]/mL (ref <5)

## 2022-09-22 LAB — CBC
HCT: 37.5 % (ref 36.0–46.0)
Hemoglobin: 12.9 g/dL (ref 12.0–15.0)
MCH: 29.2 pg (ref 26.0–34.0)
MCHC: 34.4 g/dL (ref 30.0–36.0)
MCV: 84.8 fL (ref 80.0–100.0)
Platelets: 287 10*3/uL (ref 150–400)
RBC: 4.42 MIL/uL (ref 3.87–5.11)
RDW: 13.2 % (ref 11.5–15.5)
WBC: 7.4 10*3/uL (ref 4.0–10.5)
nRBC: 0 % (ref 0.0–0.2)

## 2022-09-22 LAB — CBG MONITORING, ED: Glucose-Capillary: 154 mg/dL — ABNORMAL HIGH (ref 70–99)

## 2022-09-22 MED ORDER — SODIUM CHLORIDE 0.9 % IV BOLUS
1000.0000 mL | Freq: Once | INTRAVENOUS | Status: AC
  Administered 2022-09-22: 1000 mL via INTRAVENOUS

## 2022-09-22 MED ORDER — KETOROLAC TROMETHAMINE 15 MG/ML IJ SOLN
15.0000 mg | Freq: Once | INTRAMUSCULAR | Status: AC
  Administered 2022-09-22: 15 mg via INTRAVENOUS
  Filled 2022-09-22: qty 1

## 2022-09-22 MED ORDER — DIPHENHYDRAMINE HCL 25 MG PO CAPS
25.0000 mg | ORAL_CAPSULE | Freq: Once | ORAL | Status: AC
  Administered 2022-09-22: 25 mg via ORAL
  Filled 2022-09-22: qty 1

## 2022-09-22 MED ORDER — DEXAMETHASONE SODIUM PHOSPHATE 10 MG/ML IJ SOLN
10.0000 mg | Freq: Once | INTRAMUSCULAR | Status: AC
  Administered 2022-09-22: 10 mg via INTRAVENOUS
  Filled 2022-09-22: qty 1

## 2022-09-22 NOTE — ED Triage Notes (Addendum)
Patient is here for evaluation of generalized weakness X 4 days. Patient reports dizziness X a few weeks. Reports that she went to her PCP for the same, but was told to come to the ER for further evaluation. Patient able to ambulate without any difficulties.

## 2022-09-22 NOTE — ED Notes (Signed)
Chest xray in room

## 2022-09-22 NOTE — Discharge Instructions (Signed)
You have been seen today for your complaint of weakness, chest pain, left leg pain. Your lab work was reassuring and showed no abnormalities. Your imaging was reassuring and showed no abnormalities. Your discharge medications include your home medications. Follow up with: Your primary care provider or any primary care provider in 2 to 3 days for reevaluation of your symptoms Please seek immediate medical care if you develop any of the following symptoms: You feel confused, feel like you might faint, or faint. Your vision is blurry or you have a severe headache. You have severe pain in your abdomen, your back, or the area between your waist and hips (pelvis). You have chest pain, shortness of breath, or an irregular or fast heartbeat. You are unable to urinate, or you urinate less than normal. You have abnormal bleeding from the rectum, nose, lungs, nipples, or, if you are female, the vagina. You vomit blood. You have thoughts about hurting yourself or others. At this time there does not appear to be the presence of an emergent medical condition, however there is always the potential for conditions to change. Please read and follow the below instructions.  Do not take your medicine if  develop an itchy rash, swelling in your mouth or lips, or difficulty breathing; call 911 and seek immediate emergency medical attention if this occurs.  You may review your lab tests and imaging results in their entirety on your MyChart account.  Please discuss all results of fully with your primary care provider and other specialist at your follow-up visit.  Note: Portions of this text may have been transcribed using voice recognition software. Every effort was made to ensure accuracy; however, inadvertent computerized transcription errors may still be present.

## 2022-09-22 NOTE — ED Notes (Signed)
When giving decadrone   patient stated that it burned very bad   unlike any other medication    she was fine once it was flushed again
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
## 2023-02-23 ENCOUNTER — Other Ambulatory Visit: Payer: Self-pay | Admitting: Family Medicine

## 2023-02-23 DIAGNOSIS — K469 Unspecified abdominal hernia without obstruction or gangrene: Secondary | ICD-10-CM

## 2023-03-05 ENCOUNTER — Other Ambulatory Visit: Payer: 59

## 2023-03-27 ENCOUNTER — Ambulatory Visit
Admission: RE | Admit: 2023-03-27 | Discharge: 2023-03-27 | Disposition: A | Discharge status: Home / Self Care | Payer: 59 | Source: Ambulatory Visit | Attending: Family Medicine | Admitting: Family Medicine

## 2023-03-27 DIAGNOSIS — K469 Unspecified abdominal hernia without obstruction or gangrene: Secondary | ICD-10-CM

## 2023-04-26 ENCOUNTER — Encounter: Payer: Self-pay | Admitting: Podiatry

## 2023-04-26 ENCOUNTER — Ambulatory Visit (INDEPENDENT_AMBULATORY_CARE_PROVIDER_SITE_OTHER): Payer: 59 | Admitting: Podiatry

## 2023-04-26 DIAGNOSIS — M797 Fibromyalgia: Secondary | ICD-10-CM

## 2023-04-26 DIAGNOSIS — M778 Other enthesopathies, not elsewhere classified: Secondary | ICD-10-CM
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
## 2023-04-27 ENCOUNTER — Other Ambulatory Visit: Payer: Self-pay | Admitting: General Surgery

## 2023-04-27 DIAGNOSIS — K439 Ventral hernia without obstruction or gangrene: Secondary | ICD-10-CM

## 2023-05-10 ENCOUNTER — Inpatient Hospital Stay: Admission: RE | Admit: 2023-05-10 | Payer: 59 | Source: Ambulatory Visit

## 2023-06-04 ENCOUNTER — Inpatient Hospital Stay: Admission: RE | Admit: 2023-06-04 | Payer: 59 | Source: Ambulatory Visit

## 2023-06-22 ENCOUNTER — Other Ambulatory Visit: Payer: 59

## 2023-07-11 ENCOUNTER — Other Ambulatory Visit: Payer: 59

## 2023-08-03 ENCOUNTER — Ambulatory Visit
Admission: RE | Admit: 2023-08-03 | Discharge: 2023-08-03 | Disposition: A | Discharge status: Home / Self Care | Payer: 59 | Source: Ambulatory Visit | Attending: Family Medicine | Admitting: Family Medicine

## 2023-08-03 ENCOUNTER — Other Ambulatory Visit: Payer: 59

## 2023-08-03 ENCOUNTER — Other Ambulatory Visit: Payer: Self-pay | Admitting: Family Medicine

## 2023-08-03 DIAGNOSIS — G8929 Other chronic pain: Secondary | ICD-10-CM

## 2023-08-03 DIAGNOSIS — M5489 Other dorsalgia: Secondary | ICD-10-CM

## 2023-08-14 ENCOUNTER — Ambulatory Visit
Admission: RE | Admit: 2023-08-14 | Discharge: 2023-08-14 | Disposition: A | Discharge status: Home / Self Care | Payer: 59 | Source: Ambulatory Visit | Attending: General Surgery | Admitting: General Surgery

## 2023-08-14 DIAGNOSIS — K439 Ventral hernia without obstruction or gangrene: Secondary | ICD-10-CM

## 2023-08-14 MED ORDER — IOPAMIDOL (ISOVUE-300) INJECTION 61%
200.0000 mL | Freq: Once | INTRAVENOUS | Status: AC | PRN
  Administered 2023-08-14: 100 mL via INTRAVENOUS

## 2023-08-16 ENCOUNTER — Other Ambulatory Visit: Payer: 59

## 2023-08-23 ENCOUNTER — Other Ambulatory Visit: Payer: Self-pay | Admitting: Family Medicine

## 2023-08-23 DIAGNOSIS — M79661 Pain in right lower leg: Secondary | ICD-10-CM

## 2023-08-23 DIAGNOSIS — R42 Dizziness and giddiness: Secondary | ICD-10-CM

## 2023-08-29 ENCOUNTER — Ambulatory Visit
Admission: RE | Admit: 2023-08-29 | Discharge: 2023-08-29 | Disposition: A | Discharge status: Home / Self Care | Source: Ambulatory Visit | Attending: Family Medicine | Admitting: Family Medicine

## 2023-08-29 DIAGNOSIS — M79661 Pain in right lower leg: Secondary | ICD-10-CM

## 2023-09-24 ENCOUNTER — Ambulatory Visit
Admission: RE | Admit: 2023-09-24 | Discharge: 2023-09-24 | Disposition: A | Discharge status: Home / Self Care | Source: Ambulatory Visit | Attending: Family Medicine | Admitting: Family Medicine

## 2023-09-24 DIAGNOSIS — R42 Dizziness and giddiness: Secondary | ICD-10-CM

## 2023-09-24 MED ORDER — GADOPICLENOL 0.5 MMOL/ML IV SOLN
7.5000 mL | Freq: Once | INTRAVENOUS | Status: AC | PRN
  Administered 2023-09-24: 7.5 mL via INTRAVENOUS

## 2024-05-12 ENCOUNTER — Other Ambulatory Visit: Payer: Self-pay | Admitting: Family

## 2024-05-12 DIAGNOSIS — Z1231 Encounter for screening mammogram for malignant neoplasm of breast: Secondary | ICD-10-CM

## 2024-06-06 ENCOUNTER — Ambulatory Visit

## 2024-06-13 ENCOUNTER — Ambulatory Visit

## 2024-07-01 ENCOUNTER — Ambulatory Visit

## 2024-07-10 ENCOUNTER — Ambulatory Visit
